# Patient Record
Sex: Male | Born: 1985 | Race: Black or African American | Hispanic: No | Marital: Single | Smoking: Never smoker
Health system: Southern US, Community
[De-identification: ages and names within clinical notes are randomized; demographics above are authoritative.]

## PROBLEM LIST (undated history)

## (undated) DIAGNOSIS — J849 Interstitial pulmonary disease, unspecified: Secondary | ICD-10-CM

## (undated) DIAGNOSIS — F209 Schizophrenia, unspecified: Secondary | ICD-10-CM

## (undated) DIAGNOSIS — F319 Bipolar disorder, unspecified: Secondary | ICD-10-CM

## (undated) DIAGNOSIS — E119 Type 2 diabetes mellitus without complications: Secondary | ICD-10-CM

## (undated) DIAGNOSIS — I509 Heart failure, unspecified: Secondary | ICD-10-CM

## (undated) DIAGNOSIS — N189 Chronic kidney disease, unspecified: Secondary | ICD-10-CM

## (undated) DIAGNOSIS — F909 Attention-deficit hyperactivity disorder, unspecified type: Secondary | ICD-10-CM

## (undated) HISTORY — DX: Schizophrenia, unspecified: F20.9

## (undated) HISTORY — DX: Attention-deficit hyperactivity disorder, unspecified type: F90.9

## (undated) HISTORY — DX: Type 2 diabetes mellitus without complications: E11.9

## (undated) HISTORY — DX: Chronic kidney disease, unspecified: N18.9

## (undated) HISTORY — PX: DENTAL SURGERY: SHX609

## (undated) HISTORY — DX: Bipolar disorder, unspecified: F31.9

---
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
## 2006-01-29 ENCOUNTER — Emergency Department: Payer: Self-pay | Admitting: Emergency Medicine

## 2008-03-21 ENCOUNTER — Emergency Department: Payer: Self-pay | Admitting: Emergency Medicine

## 2010-07-17 ENCOUNTER — Inpatient Hospital Stay: Payer: Self-pay | Admitting: Internal Medicine

## 2011-08-21 LAB — DRUG SCREEN, URINE
Amphetamines, Ur Screen: NEGATIVE (ref ?–1000)
Barbiturates, Ur Screen: NEGATIVE (ref ?–200)
Benzodiazepine, Ur Scrn: NEGATIVE (ref ?–200)
Cannabinoid 50 Ng, Ur ~~LOC~~: NEGATIVE (ref ?–50)
Cocaine Metabolite,Ur ~~LOC~~: NEGATIVE (ref ?–300)
Methadone, Ur Screen: NEGATIVE (ref ?–300)
Tricyclic, Ur Screen: NEGATIVE (ref ?–1000)

## 2011-08-21 LAB — URINALYSIS, COMPLETE
Bilirubin,UR: NEGATIVE
Glucose,UR: NEGATIVE mg/dL (ref 0–75)
Ketone: NEGATIVE
Leukocyte Esterase: NEGATIVE
Protein: 30
Specific Gravity: 1.004 (ref 1.003–1.030)
Squamous Epithelial: NONE SEEN
WBC UR: <1 /HPF (ref 0–5)

## 2011-08-21 LAB — COMPREHENSIVE METABOLIC PANEL
Albumin: 3.1 g/dL — ABNORMAL LOW (ref 3.4–5.0)
Anion Gap: 9 (ref 7–16)
Bilirubin,Total: 0.2 mg/dL (ref 0.2–1.0)
Calcium, Total: 8.9 mg/dL (ref 8.5–10.1)
Chloride: 108 mmol/L — ABNORMAL HIGH (ref 98–107)
Co2: 25 mmol/L (ref 21–32)
Creatinine: 2.05 mg/dL — ABNORMAL HIGH (ref 0.60–1.30)
EGFR (African American): 51 — ABNORMAL LOW
Glucose: 183 mg/dL — ABNORMAL HIGH (ref 65–99)
SGOT(AST): 20 U/L (ref 15–37)
SGPT (ALT): 21 U/L

## 2011-08-21 LAB — CBC
MCH: 23.6 pg — ABNORMAL LOW (ref 26.0–34.0)
MCHC: 30.9 g/dL — ABNORMAL LOW (ref 32.0–36.0)
MCV: 77 fL — ABNORMAL LOW (ref 80–100)
Platelet: 162 10*3/uL (ref 150–440)
RBC: 5.29 10*6/uL (ref 4.40–5.90)
RDW: 15.8 % — ABNORMAL HIGH (ref 11.5–14.5)
WBC: 5.3 10*3/uL (ref 3.8–10.6)

## 2011-08-21 LAB — VALPROIC ACID LEVEL: Valproic Acid: 122 ug/mL — ABNORMAL HIGH

## 2011-08-21 LAB — TSH: Thyroid Stimulating Horm: 3.21 u[IU]/mL

## 2011-08-22 ENCOUNTER — Inpatient Hospital Stay: Payer: Self-pay | Admitting: Psychiatry

## 2014-05-09 ENCOUNTER — Emergency Department: Payer: Self-pay | Admitting: Emergency Medicine

## 2014-07-05 NOTE — Consult Note (Signed)
Brief Consult Note: Diagnosis: schizophrenia.   Patient was seen by consultant.   Recommend further assessment or treatment.   Orders entered.   Comments: Psychiatry: Patient seen. Known to me from outpt. Patient has history of violence and threatening behavior at home. Has been aggressive and threatening to mother and brother. Having intrusive thoughts to hurt someone. Has been on several meds. Seroquel may have been most effective. Will restart at 400mg  at night and 100mg  in am. Will follow tomorrow. [facility].  Electronic Signatures: Audery Amellapacs, Calvary Difranco T (MD)  (Signed 10-Jun-13 18:33)  Authored: Brief Consult Note   Last Updated: 10-Jun-13 18:33 by Audery Amellapacs, Masaji Billups T (MD)

## 2014-07-05 NOTE — Discharge Summary (Signed)
PATIENT NAME:  Walter Wong, Amun A MR#:  [id number] DATE OF BIRTH:  [birth year]  DATE OF ADMISSION:  08/22/2011 DATE OF DISCHARGE:  08/25/2011  HOSPITAL COURSE: See dictated History and Physical for details of admission. This 29 year old man who is seen by me in the outpatient clinic was admitted voluntarily because of worsening agitation at home which had become threatening and unacceptable to his family. He has a history of schizoaffective disorder versus schizophrenia as well as probably developmental disorder. We had recently been making some medication changes. In addition, I think that probably he and his mother have just been spending too much time together and he had been getting on her nerves. In the hospital, he did not display any dangerous behavior. At times he was a little bit loud always redirectable and not threatening. He slept okay. He participated in groups appropriately. Medications were altered in that I started him back on Seroquel, a total of 500 mg a day divided, as well as Tegretol 200 mg t.i.d. The patient tolerated this well. No side effects. I was in communication with his mother while he was in the hospital and confirmed with her that he could go ahead and come back home to stay. At the time of discharge, the patient totally denied any homicidal ideation. He did not show any grossly bizarre behavior, was denying hallucinations, was agreeable to treatment and showed improved insight.   MENTAL STATUS EXAM AT DISCHARGE: Somewhat disheveled man who looks his stated age, not very good hygiene-which is chronic. Eye contact is good. Psychomotor activity is normal. Speech is normal in rate, tone, and volume. Affect is somewhat constricted, a little bit excitable. Mood is stated as good. Thoughts are a little bit scattered but not grossly bizarre and easy to redirect. No obvious delusions. Denies auditory or visual hallucinations. Denies suicidal or homicidal ideation. Shows improved insight and  judgment.   LABORATORY DATA:  Valproic acid level done on the 10th was 122, which he was tolerating well.  Drug screen on admission was negative.  Thyroid stimulating hormone in the normal range.  Alcohol level undetectable.  Chemistry panel showed total protein to be elevated at 8.3, albumin low at 3.1. He has a creatinine of 2.05 which apparently is chronic for him. Glucose nonfasting was 183.  CBC showed hemoglobin slightly low at 12.5.    DISCHARGE MEDICATIONS:  1. Tegretol 200 mg t.i.d. 2. Seroquel 400 mg at night, 100 mg in the morning. 3. Amlodipine 10 mg per day.   DISPOSITION: Discharged back home with his mother. I will continue to follow him up for outpatient treatment.   DIAGNOSIS, PRINCIPAL AND PRIMARY:  AXIS I: Schizoaffective disorder.   SECONDARY DIAGNOSES:  AXIS I: No further.   AXIS II: Developmental disorder, not otherwise specified.   AXIS III:  1. Hypertension.  2. Chronic mild renal insufficiency.   AXIS IV: Moderate-to-severe: Chronic stress from burden of illness and stress from his family at home.   AXIS V: Functioning at the time of discharge 50.   ____________________________ Audery AmelJohn T. Brenda Samano, MD jtc:cbb D: 08/25/2011 17:02:28 ET T: 08/27/2011 17:18:25 ET JOB#: [id number]  cc: Audery AmelJohn T. Kadeisha Betsch, MD, <Dictator> Audery AmelJOHN T Ulrick Methot MD ELECTRONICALLY SIGNED 08/29/2011 15:39

## 2014-07-05 NOTE — H&P (Signed)
PATIENT NAME:  Walter Wong, Walter Wong MR#:  [id number] DATE OF BIRTH:  11-Jul-1985  DATE OF ADMISSION:  08/22/2011  IDENTIFYING INFORMATION AND CHIEF COMPLAINT: 29 year old man who is usually seen by me in the outpatient clinic. I referred him to the Emergency Room for admission yesterday because of worsening behavior problems and threats of violence at home.   CHIEF COMPLAINT: "I get these urges."   HISTORY OF PRESENT ILLNESS: Patient reports that he frequently gets urges to hit somebody. He cannot describe them very well. He denies that he is having auditory hallucinations. He admits that they do not make much sense. They do not necessarily go along with the general feeling of anger. His mood seems to mostly be anxious. He does not necessarily feel that his stress has increased at home. We have been trying to change his medication to find something that is going to work well for him to help with his impulsivity, agitation and paranoid behavior and violence at home. Patient says that he has been taking his medications recently which were Depakote and Navane. He denies that he has been using alcohol or drugs. Information obtained from the mother is that the patient has been terrorizing her at home, been increasingly threatening and violent to the point that she fears constantly for her safety.   PAST PSYCHIATRIC HISTORY: Patient appears to have Wong developmental disorder and has had behavior problems life long. He has had one prior psychiatric hospitalization that we know of that was many years ago, he estimates at least 10. He has been tried on various medications over the years including several mood stabilizers and antipsychotics. It appeared that Seroquel had been working reasonably well but not completely. We had recently been trying to make some adjustments to his medications to find something that might work better. We had tried Zyprexa and Depakote, Wong combination did not seem to be an improvement over the  Seroquel. When he was much younger he was treated with stimulants. Even the mother cannot remember if it made Wong great deal of difference or not. Patient does have Wong history of being aggressive and violent with others, especially his brother with whom he lives. He has described to me in the past that he has genuinely paranoid thoughts, feeling like his brother is trying to hurt him or do something mean to him even when there is no evidence of that. Diagnosis has been unclear. In the past he has been treated at least at one point with lithium which evidently caused kidney damage. This is what they report. Whether it was the lithium or not he does have some insufficiency in his kidney function.   PAST MEDICAL HISTORY: Patient has hypertension which may be related to his kidney damage. He has chronic renal insufficiency to some degree. I do not have past laboratory tests except what we got in the Emergency Room to compare to He is usually maintained on amlodipine.   SOCIAL HISTORY: Patient lives with his mother and older brother. Mother is disabled. Patient does not work and has never worked. Spends all of his time at home. Often gets into fights with other members of the family.   FAMILY HISTORY: Full family history of mental health problems, not known.   SUBSTANCE ABUSE HISTORY: Denies that he drinks alcohol or uses any drugs and denies that he has ever had Wong problem with that in the past.   CURRENT MEDICATIONS: At the time of admission he was taking Depakote 1000 mg at  night, 500 mg in the morning and taking Navane 2 mg in the morning, 4 mg at night. Also the amlodipine 10 mg Wong day.   ALLERGIES: No known drug allergies.   REVIEW OF SYSTEMS: Patient complains of having violent urges to hit people. He does not report any specific changes in his mood. Says that he does not have any pain. Denies any GI complaints. No neurologic complaints. Sleep is fairly stable.   MENTAL STATUS EXAM: Wong somewhat  disheveled young man who looks his stated age. Cooperative with the interview. Eye contact is normal. Psychomotor activity is normal. Speech is normal in tone, rate and amount. Affect is somewhat flattish. Mood is stated as being okay. Thoughts are simplistic, concrete. He does not make any grossly delusional statements. Denies that he is currently feeling paranoid. Does say that at times he has urges to hit people. He denies actually wanting to hurt anybody consciously and denies any suicidal ideation. Insight and judgment impaired.   PHYSICAL EXAMINATION:  GENERAL: Patient is rather large weighing 263 pounds.   VITAL SIGNS: Most recent vital signs show Wong temperature of 98, pulse 121, respirations 20, blood pressure 129/85.   SKIN: Skin is dry over much of his body. Has Wong diffuse rash his face. Does not appear that his hygiene is very good.   HEENT: Pupils are equal and reactive. Face symmetric. Oral mucosa and dentition normal.   NECK: Nontender.   BACK: Nontender.   MUSCULOSKELETAL: Has full range of motion at all extremities and normal gait. Strength and reflexes normal and symmetric throughout.   NEUROLOGICAL: Cranial nerves intact and symmetric.   LUNGS: Clear to auscultation with no wheezes.   HEART: Tachycardic but normal sounds.   ABDOMEN: Large, obese, nontender, normal bowel sounds.   ASSESSMENT: 29 year old man with what sounds like probably Wong developmental disorder but also with probably schizophrenia. Bipolar disorder is possible, although it does not appear from what I can tell that he has clear-cut discrete mood episodes so much as he has this chronic paranoia and disorganized behavior. It is possible that an autistic spectrum disorder and developmental disability may account for all of it. He may have attention deficit hyperactivity disorder. It is somewhat hard to tell in the context of his very limited lifestyle and other symptoms. Patient has been terrorizing his mother  at home. He has been having these urges to hit people and has been more threatening verbally to his mother and brother. Hospitalization required because of threats to others and potential for violence and instability with failure to treat outside the hospital.   TREATMENT PLAN: Restart Seroquel and Tegretol. Admit to the hospital. Check labs. Engage in groups and activities. Social work will communicate with the family and we will work on seeing if we need to make any kind of change in his living situation. He will be engaged in usual ward routines.   DIAGNOSIS PRINCIPLE AND PRIMARY:  AXIS I: Schizophrenia, undifferentiated.   SECONDARY DIAGNOSES:  AXIS I: No further.   AXIS II: Developmental disability, not otherwise specified.   AXIS III:  1. Hypertension.  2. Chronic renal insufficiency.   AXIS IV: Moderate to severe from chronically impaired social relations and isolation.   AXIS V: Functioning at time of evaluation 30.   ____________________________ Audery Amel, MD jtc:cms D: 08/22/2011 17:10:26 ET T: 08/23/2011 07:00:14 ET JOB#: [id number]  cc: Audery Amel, MD, <Dictator> Audery Amel MD ELECTRONICALLY SIGNED 08/24/2011 17:37

## 2014-11-12 ENCOUNTER — Telehealth: Payer: Self-pay

## 2014-11-12 ENCOUNTER — Ambulatory Visit (INDEPENDENT_AMBULATORY_CARE_PROVIDER_SITE_OTHER): Payer: Self-pay | Admitting: Psychiatry

## 2014-11-12 ENCOUNTER — Encounter: Payer: Self-pay | Admitting: Psychiatry

## 2014-11-12 VITALS — BP 122/86 | HR 94 | Temp 97.8°F | Ht 66.0 in | Wt 217.0 lb

## 2014-11-12 DIAGNOSIS — F3162 Bipolar disorder, current episode mixed, moderate: Secondary | ICD-10-CM

## 2014-11-12 DIAGNOSIS — F250 Schizoaffective disorder, bipolar type: Secondary | ICD-10-CM

## 2014-11-12 MED ORDER — CARBAMAZEPINE 200 MG PO TABS: 200.0000 mg | ORAL_TABLET | Freq: Two times a day (BID) | ORAL | Status: DC

## 2014-11-12 MED ORDER — QUETIAPINE FUMARATE 100 MG PO TABS: 100.0000 mg | ORAL_TABLET | Freq: Every day | ORAL | Status: DC

## 2014-11-12 MED ORDER — THIOTHIXENE 5 MG PO CAPS: 5.0000 mg | ORAL_CAPSULE | Freq: Two times a day (BID) | ORAL | Status: DC

## 2014-11-12 NOTE — Progress Notes (Signed)
[facility] MD Progress Note  11/12/2014 3:27 PM Walter Wong  MRN:  [id number] Subjective:  Patient seen for follow-up of his bipolar disorder. He tells me that he is doing very well. [facility] has [facility] into the [facility]. Managing his medicine well. Going through vocational rehabilitation. He is hoping that he will be able to get a job soon. All of this is very positive. He does have anxiety about the possibility of getting a job because he worries he will not be up to it. Principal Problem: @ Diagnosis:   Patient Active Problem List   Diagnosis Date Noted  . Bipolar 1 disorder, mixed, moderate [F31.62] 11/12/2014   Total Time spent with patient: 30 minutes   Past Medical History:  Past Medical History  Diagnosis Date  . ADHD (attention deficit hyperactivity disorder)   . Diabetes mellitus, type II   . Bipolar disorder   . Schizophrenia   . Chronic kidney disease     Past Surgical History  Procedure Laterality Date  . Dental surgery     Family History:  Family History  Problem Relation Age of Onset  . Kidney failure Mother    Social History:  History  Alcohol Use No     History  Drug Use No    Social History   Social History  . Marital Status: Single    Spouse Name: N/A  . Number of Children: N/A  . Years of Education: N/A   Social History Main Topics  . Smoking status: Never Smoker   . Smokeless tobacco: Never Used  . Alcohol Use: No  . Drug Use: No  . Sexual Activity: No   Other Topics Concern  . None   Social History Narrative  . None   Additional History:    Sleep: Good  Appetite:  Good   Assessment:   Musculoskeletal: Strength & Muscle Tone: within normal limits Gait & Station: normal Patient leans: N/A   Psychiatric Specialty Exam: Physical Exam  ROS  Blood pressure 122/86, pulse 94, temperature 97.8 F (36.6 C), temperature source Tympanic, height  (1.676 m), weight 217 lb (98.431 kg), SpO2 97 %.Body mass index is 35.04  kg/(m^2).  General Appearance: Casual  Eye Contact::  Good  Speech:  Clear and Coherent  Volume:  Increased  Mood:  Anxious  Affect:  Full Range  Thought Process:  Goal Directed  Orientation:  Full (Time, Place, and Person)  Thought Content:  Negative  Suicidal Thoughts:  No  Homicidal Thoughts:  No  Memory:  Immediate;   Good Recent;   Fair Remote;   Fair  Judgement:  Fair  Insight:  Fair  Psychomotor Activity:  Normal  Concentration:  Fair  Recall:  [employer] of Knowledge:Fair  Language: Fair  Akathisia:  No  Handed:  Right  AIMS (if indicated):     Assets:  Communication Skills Desire for Improvement Housing Physical Health Social Support  ADL's:  Intact  Cognition: WNL  Sleep:        Current Medications: Current Outpatient Prescriptions  Medication Sig Dispense Refill  . carbamazepine (TEGRETOL) 200 MG tablet Take 1 tablet (200 mg total) by mouth 2 (two) times daily. 60 tablet 5  . QUEtiapine (SEROQUEL) 100 MG tablet Take 1 tablet (100 mg total) by mouth daily. 30 tablet 5  . thiothixene (NAVANE) 5 MG capsule Take 1 capsule (5 mg total) by mouth 2 (two) times daily. 60 capsule 5   No current facility-administered medications for  this visit.    Lab Results: No results found for this or any previous visit (from the past 48 hour(s)).  Physical Findings: AIMS:  , ,  ,  ,    CIWA:    COWS:     Treatment Plan Summary: Medication management and Plan Halvor is wanting to decrease his dose of Tegretol. He thinks the daytime dose is making him more jittery. He is not appear to be manic really and he is happy to continue his other medicine. He is functioning better than I have seen him ever do. We discussed the possible risks and agreed to try decreasing Tegretol to 200 mg twice a day. Continue the 100 mg a day Seroquel and the Navane 5 mg twice a day. Lots of encouragement done. Supportive therapy. Reviewed side effects. I will see him back in 6 months he can call  sooner if needed.   Medical Decision Making:  Established Problem, Stable/Improving (1), Review of Psycho-Social Stressors (1) and Review of Medication Regimen & Side Effects (2)     John Clapacs 11/12/2014, 3:27 PM

## 2014-11-12 NOTE — Telephone Encounter (Signed)
pharmacy called [facility] wanted to confirm rx that was give. I checked dr. Toni Amend note and this is "We discussed the possible risks and agreed to try decreasing Tegretol to 200 mg twice a day."

## 2014-11-23 ENCOUNTER — Other Ambulatory Visit: Payer: Self-pay | Admitting: Psychiatry

## 2014-12-29 ENCOUNTER — Other Ambulatory Visit: Payer: Self-pay

## 2014-12-29 NOTE — Telephone Encounter (Signed)
received request for refill on quetiapine fumarate 300mg  take two tablets by mouth at bedtime (pt last seen on  11-12-14 next appt 05-13-15)

## 2014-12-31 MED ORDER — QUETIAPINE FUMARATE 300 MG PO TABS: ORAL_TABLET | ORAL | Status: DC

## 2015-01-22 ENCOUNTER — Encounter: Payer: Self-pay | Admitting: Emergency Medicine

## 2015-01-22 ENCOUNTER — Emergency Department: Payer: Medicare Other

## 2015-01-22 ENCOUNTER — Inpatient Hospital Stay
Admission: EM | Admit: 2015-01-22 | Discharge: 2015-01-24 | DRG: 291 | Disposition: A | Discharge status: Home / Self Care | Payer: Medicare Other | Attending: Internal Medicine | Admitting: Internal Medicine

## 2015-01-22 ENCOUNTER — Inpatient Hospital Stay (HOSPITAL_COMMUNITY)
Admit: 2015-01-22 | Discharge: 2015-01-22 | Disposition: A | Discharge status: Home / Self Care | Payer: Medicare Other | Attending: Specialist | Admitting: Specialist

## 2015-01-22 DIAGNOSIS — Z79899 Other long term (current) drug therapy: Secondary | ICD-10-CM | POA: Diagnosis not present

## 2015-01-22 DIAGNOSIS — F909 Attention-deficit hyperactivity disorder, unspecified type: Secondary | ICD-10-CM | POA: Diagnosis present

## 2015-01-22 DIAGNOSIS — E1122 Type 2 diabetes mellitus with diabetic chronic kidney disease: Secondary | ICD-10-CM | POA: Diagnosis present

## 2015-01-22 DIAGNOSIS — I509 Heart failure, unspecified: Secondary | ICD-10-CM

## 2015-01-22 DIAGNOSIS — I130 Hypertensive heart and chronic kidney disease with heart failure and stage 1 through stage 4 chronic kidney disease, or unspecified chronic kidney disease: Secondary | ICD-10-CM | POA: Diagnosis present

## 2015-01-22 DIAGNOSIS — Z841 Family history of disorders of kidney and ureter: Secondary | ICD-10-CM

## 2015-01-22 DIAGNOSIS — F209 Schizophrenia, unspecified: Secondary | ICD-10-CM | POA: Diagnosis present

## 2015-01-22 DIAGNOSIS — I5033 Acute on chronic diastolic (congestive) heart failure: Secondary | ICD-10-CM | POA: Diagnosis present

## 2015-01-22 DIAGNOSIS — F319 Bipolar disorder, unspecified: Secondary | ICD-10-CM | POA: Diagnosis present

## 2015-01-22 DIAGNOSIS — R609 Edema, unspecified: Secondary | ICD-10-CM

## 2015-01-22 DIAGNOSIS — N183 Chronic kidney disease, stage 3 (moderate): Secondary | ICD-10-CM | POA: Diagnosis present

## 2015-01-22 DIAGNOSIS — Z9889 Other specified postprocedural states: Secondary | ICD-10-CM | POA: Diagnosis not present

## 2015-01-22 LAB — COMPREHENSIVE METABOLIC PANEL
ALK PHOS: 63 U/L (ref 38–126)
ALT: 11 U/L — AB (ref 17–63)
AST: 20 U/L (ref 15–41)
Albumin: 3.2 g/dL — ABNORMAL LOW (ref 3.5–5.0)
Anion gap: 7 (ref 5–15)
BUN: 16 mg/dL (ref 6–20)
CHLORIDE: 105 mmol/L (ref 101–111)
CO2: 26 mmol/L (ref 22–32)
CREATININE: 1.76 mg/dL — AB (ref 0.61–1.24)
Calcium: 9.3 mg/dL (ref 8.9–10.3)
GFR calc Af Amer: 59 mL/min — ABNORMAL LOW (ref >60)
GFR, EST NON AFRICAN AMERICAN: 51 mL/min — AB (ref >60)
Glucose, Bld: 82 mg/dL (ref 65–99)
Potassium: 4.2 mmol/L (ref 3.5–5.1)
SODIUM: 138 mmol/L (ref 135–145)
Total Bilirubin: 0.2 mg/dL — ABNORMAL LOW (ref 0.3–1.2)
Total Protein: 9.4 g/dL — ABNORMAL HIGH (ref 6.5–8.1)

## 2015-01-22 LAB — CBC
HCT: 43.8 % (ref 40.0–52.0)
HEMOGLOBIN: 13.8 g/dL (ref 13.0–18.0)
MCH: 22.3 pg — ABNORMAL LOW (ref 26.0–34.0)
MCHC: 31.5 g/dL — ABNORMAL LOW (ref 32.0–36.0)
MCV: 70.8 fL — AB (ref 80.0–100.0)
PLATELETS: 360 10*3/uL (ref 150–440)
RBC: 6.18 MIL/uL — AB (ref 4.40–5.90)
RDW: 14.6 % — ABNORMAL HIGH (ref 11.5–14.5)
WBC: 7.6 10*3/uL (ref 3.8–10.6)

## 2015-01-22 LAB — BRAIN NATRIURETIC PEPTIDE: B Natriuretic Peptide: 12 pg/mL (ref 0.0–100.0)

## 2015-01-22 LAB — MRSA PCR SCREENING: MRSA by PCR: NEGATIVE

## 2015-01-22 LAB — TROPONIN I: Troponin I: 0.03 ng/mL (ref <0.031)

## 2015-01-22 MED ORDER — QUETIAPINE FUMARATE 100 MG PO TABS
600.0000 mg | ORAL_TABLET | Freq: Every day | ORAL | Status: DC
  Administered 2015-01-22 – 2015-01-23 (×2): 600 mg via ORAL
  Filled 2015-01-22 (×2): qty 6

## 2015-01-22 MED ORDER — ACETAMINOPHEN 650 MG RE SUPP: 650.0000 mg | Freq: Four times a day (QID) | RECTAL | Status: DC | PRN

## 2015-01-22 MED ORDER — QUETIAPINE FUMARATE 100 MG PO TABS
100.0000 mg | ORAL_TABLET | Freq: Every day | ORAL | Status: DC
  Administered 2015-01-23 – 2015-01-24 (×2): 100 mg via ORAL
  Filled 2015-01-22 (×2): qty 1

## 2015-01-22 MED ORDER — ONDANSETRON HCL 4 MG PO TABS: 4.0000 mg | ORAL_TABLET | Freq: Four times a day (QID) | ORAL | Status: DC | PRN

## 2015-01-22 MED ORDER — HEPARIN SODIUM (PORCINE) 5000 UNIT/ML IJ SOLN
5000.0000 [IU] | Freq: Three times a day (TID) | INTRAMUSCULAR | Status: DC
  Administered 2015-01-22 – 2015-01-24 (×6): 5000 [IU] via SUBCUTANEOUS
  Filled 2015-01-22 (×5): qty 1

## 2015-01-22 MED ORDER — SODIUM CHLORIDE 0.9 % IJ SOLN
3.0000 mL | Freq: Two times a day (BID) | INTRAMUSCULAR | Status: DC
  Administered 2015-01-22 – 2015-01-24 (×4): 3 mL via INTRAVENOUS

## 2015-01-22 MED ORDER — THIOTHIXENE 5 MG PO CAPS
5.0000 mg | ORAL_CAPSULE | Freq: Two times a day (BID) | ORAL | Status: DC
  Administered 2015-01-22 – 2015-01-24 (×4): 5 mg via ORAL
  Filled 2015-01-22 (×6): qty 1

## 2015-01-22 MED ORDER — ACETAMINOPHEN 325 MG PO TABS: 650.0000 mg | ORAL_TABLET | Freq: Four times a day (QID) | ORAL | Status: DC | PRN

## 2015-01-22 MED ORDER — FUROSEMIDE 10 MG/ML IJ SOLN
40.0000 mg | Freq: Two times a day (BID) | INTRAMUSCULAR | Status: DC
  Administered 2015-01-22 – 2015-01-23 (×2): 40 mg via INTRAVENOUS
  Filled 2015-01-22 (×2): qty 4

## 2015-01-22 MED ORDER — CARBAMAZEPINE 200 MG PO TABS
200.0000 mg | ORAL_TABLET | Freq: Two times a day (BID) | ORAL | Status: DC
  Administered 2015-01-22 – 2015-01-24 (×4): 200 mg via ORAL
  Filled 2015-01-22 (×4): qty 1

## 2015-01-22 MED ORDER — ONDANSETRON HCL 4 MG/2ML IJ SOLN: 4.0000 mg | Freq: Four times a day (QID) | INTRAMUSCULAR | Status: DC | PRN

## 2015-01-22 NOTE — ED Provider Notes (Signed)
[facility] Emergency Department Provider Note  ____________________________________________  Time seen: On arrival  I have reviewed the triage vital signs and the nursing notes.   HISTORY  Chief Complaint Cough and Shortness of Breath    HPI Walter Wong is a 29 y.o. male who presents with complaints of shortness of breath. He reports that he has been feeling short of breath for the last month but it has worsened over the last 2 days. He reports he feels he is breathing rapidly. He denies chest pain. He denies lower extremity swelling. He has been told that he has kidney failure but does not know how severe this is reportedly was related to lithium use. He denies fevers chills although he does have a cough. No recent travel. No calf pain.     Past Medical History  Diagnosis Date  . ADHD (attention deficit hyperactivity disorder)   . Diabetes mellitus, type II (HCC)   . Bipolar disorder (HCC)   . Schizophrenia (HCC)   . Chronic kidney disease     Patient Active Problem List   Diagnosis Date Noted  . Bipolar 1 disorder, mixed, moderate (HCC) 11/12/2014    Past Surgical History  Procedure Laterality Date  . Dental surgery      Current Outpatient Rx  Name  Route  Sig  Dispense  Refill  . carbamazepine (TEGRETOL) 200 MG tablet   Oral   Take 1 tablet (200 mg total) by mouth 2 (two) times daily.   60 tablet   5   . QUEtiapine (SEROQUEL) 100 MG tablet   Oral   Take 1 tablet (100 mg total) by mouth daily.   30 tablet   5   . QUEtiapine (SEROQUEL) 300 MG tablet      Take two tablets by mouth at bedtime   60 tablet   5   . thiothixene (NAVANE) 5 MG capsule   Oral   Take 1 capsule (5 mg total) by mouth 2 (two) times daily.   60 capsule   5     Allergies Review of patient's allergies indicates no known allergies.  Family History  Problem Relation Age of Onset  . Kidney failure Mother     Social History Social History   Substance Use Topics  . Smoking status: Never Smoker   . Smokeless tobacco: Never Used  . Alcohol Use: No    Review of Systems  Constitutional: Negative for fever. Eyes: Negative for visual changes. ENT: Negative for sore throat Cardiovascular: Negative for chest pain. Respiratory positive for shortness of breath and cough Gastrointestinal: Negative for abdominal pain, vomiting and diarrhea. Genitourinary: Negative for dysuria. Musculoskeletal: Negative for back pain. Skin: Negative for rash. Neurological: Negative for headaches or focal weakness Psychiatric: No anxiety    ____________________________________________   PHYSICAL EXAM:  VITAL SIGNS: ED Triage Vitals  Enc Vitals Group     BP 01/22/15 1100 136/93 mmHg     Pulse Rate 01/22/15 1100 105     Resp 01/22/15 1100 24     Temp --      Temp src --      SpO2 01/22/15 1100 91 %     Weight 01/22/15 1100 214 lb (97.07 kg)     Height 01/22/15 1100 5\' 6"  (1.676 m)     Head Cir --      Peak Flow --      Pain Score 01/22/15 1101 7     Pain Loc --  Pain Edu? --      Excl. in GC? --      Constitutional: Alert and oriented. Well appearing and in no distress. Eyes: Conjunctivae are normal.  ENT   Head: Normocephalic and atraumatic.   Mouth/Throat: Mucous membranes are moist. Cardiovascular: Normal rate, regular rhythm. Normal and symmetric distal pulses are present in all extremities. No murmurs, rubs, or gallops. Respiratory: Positive tachypnea. Mild bibasilar rales Gastrointestinal: Soft and non-tender in all quadrants. No distention. There is no CVA tenderness. Genitourinary: deferred Musculoskeletal: Nontender with normal range of motion in all extremities. No lower extremity tenderness nor edema. Neurologic:  Normal speech and language. No gross focal neurologic deficits are appreciated. Skin:  Skin is warm, dry and intact. No rash noted. Psychiatric: Mood and affect are normal. Patient exhibits  appropriate insight and judgment.  ____________________________________________    LABS (pertinent positives/negatives)  Labs Reviewed  CBC - Abnormal; Notable for the following:    RBC 6.18 (*)    MCV 70.8 (*)    MCH 22.3 (*)    MCHC 31.5 (*)    RDW 14.6 (*)    All other components within normal limits  COMPREHENSIVE METABOLIC PANEL - Abnormal; Notable for the following:    Creatinine, Ser 1.76 (*)    Total Protein 9.4 (*)    Albumin 3.2 (*)    ALT 11 (*)    Total Bilirubin 0.2 (*)    GFR calc non Af Amer 51 (*)    GFR calc Af Amer 59 (*)    All other components within normal limits  TROPONIN I  BRAIN NATRIURETIC PEPTIDE    ____________________________________________   EKG   ED ECG REPORT I, Jene Every, the attending physician, personally viewed and interpreted this ECG.   Date: 01/22/2015  EKG Time: 11:12 AM  Rate: 107  Rhythm: sinus tachycardia  Axis: Normal  Intervals:none  ST&T Change: Nonspecific   ____________________________________________    RADIOLOGY I have personally reviewed any xrays that were ordered on this patient: Positive interstitial edema chest x-ray  ____________________________________________   PROCEDURES  Procedure(s) performed: none  Critical Care performed: none  ____________________________________________   INITIAL IMPRESSION / ASSESSMENT AND PLAN / ED COURSE  Pertinent labs & imaging results that were available during my care of the patient were reviewed by me and considered in my medical decision making (see chart for details).  Patient presents with shortness of breath which is worse when lying flat. X-ray is consistent with interstitial edema. This is likely related to a new diagnosis of CHF given  kidney function has not worsened.  Given the patient's tachypnea and I suspect he will require admission I will consult the hospitalist  ____________________________________________   FINAL CLINICAL  IMPRESSION(S) / ED DIAGNOSES  Final diagnoses:  Congestive heart failure, unspecified congestive heart failure chronicity, unspecified congestive heart failure type (HCC)  Interstitial edema     Jene Every, MD 01/22/15 1510

## 2015-01-22 NOTE — H&P (Signed)
[facility] - [place] at [facility]   PATIENT NAME: Walter GoldmannJason Wong    MR#:  [id number]  DATE OF BIRTH:  11/14/1985  DATE OF ADMISSION:  01/22/2015  PRIMARY CARE PHYSICIAN: Mickel FuchsWROTH, THOMAS H, MD  REQUESTING/REFERRING PHYSICIAN: Dr. Jene Everyobert Kinner  CHIEF COMPLAINT:   Chief Complaint  Patient presents with  . Cough  . Shortness of Breath    HISTORY OF PRESENT ILLNESS:  Walter GoldmannJason Mcclees  is a 29 y.o. male with a known history of diabetes type 2, bipolar disorder, schizophrenia, chronic kidney disease stage III, ADHD who presents to the hospital due to shortness of breath and cough progressively getting worse over the past few days. Is that he has been short of breath for quite a long time but has progressively gotten worse over the past few days. He admits to cough is productive of clear sputum. He denies any fevers, chills, paroxysmal nocturnal dyspnea, orthopnea, leg swelling or weight gain. Given his progressive nature of his symptoms and was brought to the hospital for further evaluation. Emergency room patient was noted to have chest x-ray findings to suggest a primary vascular congestion and he was noted to have been in mild congestive heart failure which is new onset for the patient. Hospitalist services were contacted further treatment and evaluation.  PAST MEDICAL HISTORY:   Past Medical History  Diagnosis Date  . ADHD (attention deficit hyperactivity disorder)   . Diabetes mellitus, type II (HCC)   . Bipolar disorder (HCC)   . Schizophrenia (HCC)   . Chronic kidney disease     PAST SURGICAL HISTORY:   Past Surgical History  Procedure Laterality Date  . Dental surgery      SOCIAL HISTORY:   Social History  Substance Use Topics  . Smoking status: Never Smoker   . Smokeless tobacco: Never Used  . Alcohol Use: No    FAMILY HISTORY:   Family History  Problem Relation Age of Onset  . Kidney failure Mother     DRUG ALLERGIES:  No Known  Allergies  REVIEW OF SYSTEMS:   Review of Systems  Constitutional: Negative for fever and weight loss.  HENT: Negative for congestion, nosebleeds and tinnitus.   Eyes: Negative for blurred vision, double vision and redness.  Respiratory: Positive for cough, sputum production and shortness of breath. Negative for hemoptysis.   Cardiovascular: Negative for chest pain, orthopnea, leg swelling and PND.  Gastrointestinal: Negative for nausea, vomiting, abdominal pain, diarrhea and melena.  Genitourinary: Negative for dysuria, urgency and hematuria.  Musculoskeletal: Negative for joint pain and falls.  Neurological: Positive for weakness. Negative for dizziness, tingling, sensory change, focal weakness, seizures and headaches.  Endo/Heme/Allergies: Negative for polydipsia. Does not bruise/bleed easily.  Psychiatric/Behavioral: Negative for depression and memory loss. The patient is not nervous/anxious.     MEDICATIONS AT HOME:   Prior to Admission medications   Medication Sig Start Date End Date Taking? Authorizing Provider  carbamazepine (TEGRETOL) 200 MG tablet Take 1 tablet (200 mg total) by mouth 2 (two) times daily. 11/12/14  Yes Audery AmelJohn T Clapacs, MD  QUEtiapine (SEROQUEL) 100 MG tablet Take 1 tablet (100 mg total) by mouth daily. 11/12/14  Yes Audery AmelJohn T Clapacs, MD  QUEtiapine (SEROQUEL) 300 MG tablet Take 600 mg by mouth at bedtime.   Yes Historical Provider, MD  thiothixene (NAVANE) 5 MG capsule Take 1 capsule (5 mg total) by mouth 2 (two) times daily. 11/12/14  Yes Audery AmelJohn T Clapacs, MD      VITAL  SIGNS:  Blood pressure 117/87, pulse 86, resp. rate 40, height  (1.676 m), weight 97.07 kg (214 lb), SpO2 98 %.  PHYSICAL EXAMINATION:  Physical Exam  GENERAL:  29 y.o.-year-old patient lying in the bed in mild respiratory distress.  EYES: Pupils equal, round, reactive to light and accommodation. No scleral icterus. Extraocular muscles intact.  HEENT: Head atraumatic, normocephalic.  Oropharynx and nasopharynx clear. No oropharyngeal erythema, moist oral mucosa  NECK:  Supple, no jugular venous distention. No thyroid enlargement, no tenderness.  LUNGS: Diffuse crackles bilaterally. Positive use of accessory muscles, no dullness to percussion. CARDIOVASCULAR: S1, S2 RRR. No murmurs, rubs, gallops, clicks.  ABDOMEN: Soft, nontender, nondistended. Bowel sounds present. No organomegaly or mass.  EXTREMITIES: No pedal edema, cyanosis, or clubbing. + 2 pedal & radial pulses b/l.   NEUROLOGIC: Cranial nerves II through XII are intact. No focal Motor or sensory deficits appreciated b/l PSYCHIATRIC: The patient is alert and oriented x 3. Good affect.  SKIN: No obvious rash, lesion, or ulcer.   LABORATORY PANEL:   CBC  Recent Labs Lab 01/22/15 1125  WBC 7.6  HGB 13.8  HCT 43.8  PLT 360   ------------------------------------------------------------------------------------------------------------------  Chemistries   Recent Labs Lab 01/22/15 1125  NA 138  K 4.2  CL 105  CO2 26  GLUCOSE 82  BUN 16  CREATININE 1.76*  CALCIUM 9.3  AST 20  ALT 11*  ALKPHOS 63  BILITOT 0.2*   ------------------------------------------------------------------------------------------------------------------  Cardiac Enzymes  Recent Labs Lab 01/22/15 1125  TROPONINI 0.03   ------------------------------------------------------------------------------------------------------------------  RADIOLOGY:  Dg Chest 2 View  01/22/2015  CLINICAL DATA:  Shortness of breath with exertion EXAM: CHEST  2 VIEW COMPARISON:  Jul 17, 2010 FINDINGS: Degree of inspiration is shallow. There is bibasilar interstitial edema. Heart is upper normal in size with mild pulmonary venous hypertension. No adenopathy. No bone lesions. No pneumothorax. IMPRESSION: Findings indicative of a degree of congestive heart failure. No airspace consolidation. Electronically Signed   By: Bretta Bang III M.D.   On:  01/22/2015 11:53     IMPRESSION AND PLAN:   29 year old male with past medical history of diabetes, hypertension, ADHD, history of bipolar disorder, chronic kidney disease stage III who presents to the hospital due to shortness of breath cough and noted to be in congestive heart failure.  #1 congestive heart failure-this is likely presenting diagnosis given his chest x-ray findings and his clinical symptoms. -Unclear of this is diastolic or systolic. I will start him on IV Lasix, follow I's and O's and daily weights. -Check a two-dimensional echocardiogram. - If needed will consult cardiology.  #2 chronic kidney disease stage III-patient's creatinine is close to baseline. I'll follow his BUN and creatinine with IV diuresis.  #3 hypertension-patient was not taking antihypertensive particle into the hospital. -Place him on when necessary hydralazine.  #4 history of schizophrenia-continue Seroquel.  #5 history of bipolar disorder-continue Tegretol.   All the records are reviewed and case discussed with ED provider. Management plans discussed with the patient, family and they are in agreement.  CODE STATUS: Full  TOTAL TIME TAKING CARE OF THIS PATIENT: 50 minutes.    Houston Siren M.D on 01/22/2015 at 4:20 PM  Between 7am to 6pm - Pager - [phone]  After 6pm go to [email or web address] - password [email or web address] [facility]  [place] [facility]  Office  [phone]  CC: Primary care physician; Mickel Fuchs, MD

## 2015-01-22 NOTE — ED Notes (Signed)
Pt placed on 2L via [place].  

## 2015-01-22 NOTE — ED Notes (Signed)
Pt sent here by PCP for cough x1 month, increased shortness of breath. Pt becomes very short of breath with little exertion. Pt denies hx of smoking.

## 2015-01-22 NOTE — Progress Notes (Signed)
*  PRELIMINARY RESULTS* Echocardiogram 2D Echocardiogram has been performed.  Walter Wong 01/22/2015, 7:14 PM

## 2015-01-23 LAB — BASIC METABOLIC PANEL
Anion gap: 9 (ref 5–15)
BUN: 22 mg/dL — ABNORMAL HIGH (ref 6–20)
CO2: 26 mmol/L (ref 22–32)
Calcium: 9.3 mg/dL (ref 8.9–10.3)
Chloride: 102 mmol/L (ref 101–111)
Creatinine, Ser: 1.91 mg/dL — ABNORMAL HIGH (ref 0.61–1.24)
GFR calc Af Amer: 54 mL/min — ABNORMAL LOW (ref >60)
GFR calc non Af Amer: 46 mL/min — ABNORMAL LOW (ref >60)
Glucose, Bld: 100 mg/dL — ABNORMAL HIGH (ref 65–99)
Potassium: 4.4 mmol/L (ref 3.5–5.1)
Sodium: 137 mmol/L (ref 135–145)

## 2015-01-23 LAB — CBC
HEMATOCRIT: 41 % (ref 40.0–52.0)
HEMOGLOBIN: 13 g/dL (ref 13.0–18.0)
MCH: 22.5 pg — ABNORMAL LOW (ref 26.0–34.0)
MCHC: 31.8 g/dL — AB (ref 32.0–36.0)
MCV: 70.8 fL — ABNORMAL LOW (ref 80.0–100.0)
Platelets: 358 10*3/uL (ref 150–440)
RBC: 5.8 MIL/uL (ref 4.40–5.90)
RDW: 14.7 % — ABNORMAL HIGH (ref 11.5–14.5)
WBC: 7.8 10*3/uL (ref 3.8–10.6)

## 2015-01-23 MED ORDER — FUROSEMIDE 20 MG PO TABS
20.0000 mg | ORAL_TABLET | Freq: Two times a day (BID) | ORAL | Status: DC
  Administered 2015-01-23 – 2015-01-24 (×2): 20 mg via ORAL
  Filled 2015-01-23 (×2): qty 1

## 2015-01-23 NOTE — Progress Notes (Signed)
[facility] -  at [facility]   PATIENT NAME: Javyn Havlin    MR#:  [id number]  DATE OF BIRTH:  09/01/1985  SUBJECTIVE: 29 year old male patient admitted for shortness of breath found to have CHF exacerbation. Patient seen today, he says he feels less short of breath and he feels better today.   CHIEF COMPLAINT:   Chief Complaint  Patient presents with  . Cough  . Shortness of Breath    REVIEW OF SYSTEMS:    Review of Systems  Constitutional: Negative for fever and chills.  HENT: Negative for hearing loss and tinnitus.   Eyes: Negative for blurred vision, double vision and photophobia.  Respiratory: Negative for cough, hemoptysis and shortness of breath.   Cardiovascular: Negative for chest pain, palpitations, orthopnea and leg swelling.  Gastrointestinal: Negative for heartburn, vomiting, abdominal pain and diarrhea.  Genitourinary: Negative for dysuria and urgency.  Musculoskeletal: Negative for myalgias and neck pain.  Skin: Negative for rash.  Neurological: Negative for dizziness, focal weakness, seizures, weakness and headaches.  Psychiatric/Behavioral: Negative for memory loss. The patient does not have insomnia.     Nutrition:  Tolerating Diet: Tolerating PT:      DRUG ALLERGIES:  No Known Allergies  VITALS:  Blood pressure 111/60, pulse 94, temperature 98.1 F (36.7 C), temperature source Oral, resp. rate 18, height  (1.676 m), weight 93.32 kg (205 lb 11.7 oz), SpO2 98 %.  PHYSICAL EXAMINATION:   Physical Exam  GENERAL:  29 y.o.-year-old patient lying in the bed with no acute distress.  EYES: Pupils equal, round, reactive to light and accommodation. No scleral icterus. Extraocular muscles intact.  HEENT: Head atraumatic, normocephalic. Oropharynx and nasopharynx clear.  NECK:  Supple, no jugular venous distention. No thyroid enlargement, no tenderness.  LUNGS: Normal breath sounds bilaterally, no wheezing, rales,rhonchi  or crepitation. No use of accessory muscles of respiration.  CARDIOVASCULAR: S1, S2 normal. No murmurs, rubs, or gallops.  ABDOMEN: Soft, nontender, nondistended. Bowel sounds present. No organomegaly or mass.  EXTREMITIES: No pedal edema, cyanosis, or clubbing.  NEUROLOGIC: Cranial nerves II through XII are intact. Muscle strength 5/5 in all extremities. Sensation intact. Gait not checked.  PSYCHIATRIC: The patient is alert and oriented x 3.  SKIN: No obvious rash, lesion, or ulcer.    LABORATORY PANEL:   CBC  Recent Labs Lab 01/23/15 0459  WBC 7.8  HGB 13.0  HCT 41.0  PLT 358   ------------------------------------------------------------------------------------------------------------------  Chemistries   Recent Labs Lab 01/22/15 1125 01/23/15 0459  NA 138 137  K 4.2 4.4  CL 105 102  CO2 26 26  GLUCOSE 82 100*  BUN 16 22*  CREATININE 1.76* 1.91*  CALCIUM 9.3 9.3  AST 20  --   ALT 11*  --   ALKPHOS 63  --   BILITOT 0.2*  --    ------------------------------------------------------------------------------------------------------------------  Cardiac Enzymes  Recent Labs Lab 01/22/15 1125  TROPONINI 0.03   ------------------------------------------------------------------------------------------------------------------  RADIOLOGY:  Dg Chest 2 View  01/22/2015  CLINICAL DATA:  Shortness of breath with exertion EXAM: CHEST  2 VIEW COMPARISON:  Jul 17, 2010 FINDINGS: Degree of inspiration is shallow. There is bibasilar interstitial edema. Heart is upper normal in size with mild pulmonary venous hypertension. No adenopathy. No bone lesions. No pneumothorax. IMPRESSION: Findings indicative of a degree of congestive heart failure. No airspace consolidation. Electronically Signed   By: Bretta Bang III M.D.   On: 01/22/2015 11:53     ASSESSMENT AND PLAN:  Active Problems:   CHF (congestive heart failure) (HCC) Acute on diastolic CHF: Patient EF is more  than 50%. Change IV Lasix to by mouth Lasix.  3.schizophrenia;/bipolar disorder'Stable #4 chronic kidney disease stage III: Renal function is slightly worse today watch closely while he is on IV Lasix.   likely discharge tomorrow  All the records are reviewed and case discussed with Care Management/Social Workerr. Management plans discussed with the patient, family and they are in agreement.  CODE STATUS:full TOTAL TIME TAKING CARE OF THIS PATIENT: 35 minutes.   POSSIBLE D/C IN 1-2DAYS, DEPENDING ON CLINICAL CONDITION.   Katha HammingKONIDENA,Tilford Deaton M.D on 01/23/2015 at 12:56 PM  Between 7am to 6pm - Pager - [phone]  After 6pm go to [email or web address] - password [email or web address] [facility]  [facility]  Office  [phone]  CC: Primary care physician; Mickel FuchsWROTH, THOMAS H, MD

## 2015-01-23 NOTE — Clinical Social Work Note (Signed)
Clinical Social Work Assessment  Patient Details  Name: Walter Wong MRN: [id number] Date of Birth: 25-Mar-1985  Date of referral:  01/23/15               Reason for consult:  Facility Placement                Permission sought to share information with:  Facility Art therapist granted to share information::  Yes, Verbal Permission Granted  Name::        Agency::     Relationship::     Contact Information:     Housing/Transportation Living arrangements for the past 2 months:  Group Home Source of Information:  Patient Patient Interpreter Needed:  None Criminal Activity/Legal Involvement Pertinent to Current Situation/Hospitalization:  No - Comment as needed Significant Relationships:  Parents Lives with:  Facility Resident Do you feel safe going back to the place where you live?  Yes Need for family participation in patient care:  No (Coment)  Care giving concerns:  [place] Work[facility] assessment / plan:  CSW met with patient this afternoon and he was very pleasant and stated that he has lived at [facility] for 2 years. Patient states he likes it there and gets what he needs. He states his mom is his main support system and that she will pick him up tomorrow when discharged and take him back to the group home. Patient states he has no concerns at this time.   CSW contacted the owner of the group home: Walter Wong: [phone] and Walter Wong stated that he can take patient back and is anticipating him returning tomorrow.   Employment status:  Disabled (Comment on whether or not currently receiving Disability) Insurance information:  [place] PT Recommendations:  Not assessed at this time Information / Referral to community resources:     Patient/Family's Response to care:  Patient happy about getting to leave hospital soon.  Patient/Family's Understanding of and Emotional Response to Diagnosis,  Current Treatment, and Prognosis:  Patient expressed understanding that he may discharge tomorrow and already had arranged for his mom to transport him when time.  Emotional Assessment Appearance:  Appears stated age Attitude/Demeanor/Rapport:   (pleasant and cooperative) Affect (typically observed):  Accepting, Appropriate, Calm, Happy Orientation:  Oriented to Self, Oriented to Place, Oriented to  Time, Oriented to Situation Alcohol / Substance use:  Not Applicable Psych involvement (Current and /or in the community):  Outpatient Provider  Discharge Needs  Concerns to be addressed:  Care Coordination Readmission within the last 30 days:  No Current discharge risk:  None Barriers to Discharge:  No Barriers Identified   Walter Leff, LCSW 01/23/2015, 3:12 PM

## 2015-01-24 MED ORDER — FUROSEMIDE 20 MG PO TABS: 20.0000 mg | ORAL_TABLET | Freq: Two times a day (BID) | ORAL | Status: DC

## 2015-01-24 NOTE — Discharge Summary (Signed)
Walter Wong, is a 29 y.o. male  DOB 03/09/1986  MRN [id number].  Admission date:  01/22/2015  Admitting Physician  Houston SirenVivek J Sainani, MD  Discharge Date:  01/24/2015   Primary MD  Mickel FuchsWROTH, THOMAS H, MD  Recommendations for primary care physician for things to follow:  Follow-up with primary doctor in 1 week   Admission Diagnosis  Interstitial edema [R60.9] Congestive heart failure, unspecified congestive heart failure chronicity, unspecified congestive heart failure type (HCC) [I50.9]   Discharge Diagnosis  Interstitial edema [R60.9] Congestive heart failure, unspecified congestive heart failure chronicity, unspecified congestive heart failure type (HCC) [I50.9]    Active Problems:   CHF (congestive heart failure) (HCC)      Past Medical History  Diagnosis Date  . ADHD (attention deficit hyperactivity disorder)   . Diabetes mellitus, type II (HCC)   . Bipolar disorder (HCC)   . Schizophrenia (HCC)   . Chronic kidney disease     Past Surgical History  Procedure Laterality Date  . Dental surgery         History of present illness and  Hospital Course:     Kindly see H&P for history of present illness and admission details, please review complete Labs, Consult reports and Test reports for all details in brief  HPI  from the history and physical done on the day of admission 29 year old male patient with history ofADHD, diabetes mellitus type 2, chronic kidney disease stage III admitted for shortness of breath, cough. Patient noted to have congestive heart failure . Admitted for the same.  Hospital Course   1 acute on chronic diastolic heart failure:patient's started on IV Lasix. Echocardiogram showed EF of more  than 60%.patient is feeling better today no shortness of breath. Patient to be discharged back to  group home with by mouth Lasix.needs close monitoring of kidney function. Patient says that he follows up with Dr. Thedore MinsSingh there and advised that he follows up with him in about 1 week so that his kidney labs will be checked. 2.history of bipolar disorder;continue Tegretol, Seroquel, novane #3 chronic kidney disease stage III:H and creatinine is around 1.76 at baseline.   Discharge Condition: stable   Follow UP      Discharge Instructions  and  Discharge Medications        Medication List    TAKE these medications        carbamazepine 200 MG tablet  Commonly known as:  TEGRETOL  Take 1 tablet (200 mg total) by mouth 2 (two) times daily.     furosemide 20 MG tablet  Commonly known as:  LASIX  Take 1 tablet (20 mg total) by mouth 2 (two) times daily.     QUEtiapine 300 MG tablet  Commonly known as:  SEROQUEL  Take 600 mg by mouth at bedtime.     QUEtiapine 100 MG tablet  Commonly known as:  SEROQUEL  Take 1 tablet (100 mg total) by mouth daily.     thiothixene 5 MG capsule  Commonly known as:  NAVANE  Take 1 capsule (5 mg total) by mouth 2 (two) times daily.          Diet and Activity recommendation: See Discharge Instructions above   Consults obtained -none   Major procedures and Radiology Reports - PLEASE review detailed and final reports for all details, in brief -     Dg Chest 2 View  01/22/2015  CLINICAL DATA:  Shortness of breath with exertion EXAM: CHEST  2  VIEW COMPARISON:  Jul 17, 2010 FINDINGS: Degree of inspiration is shallow. There is bibasilar interstitial edema. Heart is upper normal in size with mild pulmonary venous hypertension. No adenopathy. No bone lesions. No pneumothorax. IMPRESSION: Findings indicative of a degree of congestive heart failure. No airspace consolidation. Electronically Signed   By: Bretta Bang III M.D.   On: 01/22/2015 11:53    Micro Results    Recent Results (from the past 240 hour(s))  MRSA PCR Screening      Status: None   Collection Time: 01/22/15  9:50 PM  Result Value Ref Range Status   MRSA by PCR NEGATIVE NEGATIVE Final    Comment:        The GeneXpert MRSA Assay (FDA approved for NASAL specimens only), is one component of a comprehensive MRSA colonization surveillance program. It is not intended to diagnose MRSA infection nor to guide or monitor treatment for MRSA infections.        Today   Subjective:   Walter Wong today has no headache,no chest abdominal pain,no new weakness tingling or numbness,no sob. feels much better wants to go home today  Objective:   Blood pressure 112/79, pulse 92, temperature 98.5 F (36.9 C), temperature source Oral, resp. rate 19, height  (1.676 m), weight 93.32 kg (205 lb 11.7 oz), SpO2 95 %.   Intake/Output Summary (Last 24 hours) at 01/24/15 1107 Last data filed at 01/24/15 1050  Gross per 24 hour  Intake    960 ml  Output   3550 ml  Net  -2590 ml    Exam Awake Alert, Oriented x 3, No new F.N deficits, Normal affect .AT,PERRAL Supple Neck,No JVD, No cervical lymphadenopathy appriciated.  Symmetrical Chest wall movement, Good air movement bilaterally, CTAB RRR,No Gallops,Rubs or new Murmurs, No Parasternal Heave +ve B.Sounds, Abd Soft, Non tender, No organomegaly appriciated, No rebound -guarding or rigidity. No Cyanosis, Clubbing or edema, No new Rash or bruise  Data Review   CBC w Diff: Lab Results  Component Value Date   WBC 7.8 01/23/2015   WBC 5.3 08/21/2011   HGB 13.0 01/23/2015   HGB 12.5* 08/21/2011   HCT 41.0 01/23/2015   HCT 40.4 08/21/2011   PLT 358 01/23/2015   PLT 162 08/21/2011    CMP: Lab Results  Component Value Date   NA 137 01/23/2015   NA 142 08/21/2011   K 4.4 01/23/2015   K 3.9 08/21/2011   CL 102 01/23/2015   CL 108* 08/21/2011   CO2 26 01/23/2015   CO2 25 08/21/2011   BUN 22* 01/23/2015   BUN 15 08/21/2011   CREATININE 1.91* 01/23/2015   CREATININE 2.05* 08/21/2011   PROT 9.4*  01/22/2015   PROT 8.3* 08/21/2011   ALBUMIN 3.2* 01/22/2015   ALBUMIN 3.1* 08/21/2011   BILITOT 0.2* 01/22/2015   BILITOT 0.2 08/21/2011   ALKPHOS 63 01/22/2015   ALKPHOS 81 08/21/2011   AST 20 01/22/2015   AST 20 08/21/2011   ALT 11* 01/22/2015   ALT 21 08/21/2011  .   Total Time in preparing paper work, data evaluation and todays exam - 35 minutes  Walter Wong M.D on 01/24/2015 at 11:07 AM    Note: This dictation was prepared with Dragon dictation along with smaller phrase technology. Any transcriptional errors that result from this process are unintentional.

## 2015-01-24 NOTE — Discharge Instructions (Signed)
Furosemide tablets °What is this medicine? °FUROSEMIDE (fyoor [place] se mide) is a diuretic. It helps you make more urine and to lose salt and excess water from your body. This medicine is used to treat high blood pressure, and edema or swelling from heart, kidney, or liver disease. °This medicine may be used for other purposes; ask your health care provider or pharmacist if you have questions. °What should I tell my health care provider before I take this medicine? °They need to know if you have any of these conditions: °-abnormal blood electrolytes °-diarrhea or vomiting °-gout °-heart disease °-kidney disease, small amounts of urine, or difficulty passing urine °-liver disease °-thyroid disease °-an unusual or allergic reaction to furosemide, sulfa drugs, other medicines, foods, dyes, or preservatives °-pregnant or trying to get pregnant °-breast-feeding °How should I use this medicine? °Take this medicine by mouth with a glass of water. Follow the directions on the prescription label. You may take this medicine with or without food. If it upsets your stomach, take it with food or milk. Do not take your medicine more often than directed. Remember that you will need to pass more urine after taking this medicine. Do not take your medicine at a time of day that will cause you problems. Do not take at bedtime. °Talk to your pediatrician regarding the use of this medicine in children. While this drug may be prescribed for selected conditions, precautions do apply. °Overdosage: If you think you have taken too much of this medicine contact a poison control center or emergency room at once. °NOTE: This medicine is only for you. Do not share this medicine with others. °What if I miss a dose? °If you miss a dose, take it as soon as you can. If it is almost time for your next dose, take only that dose. Do not take double or extra doses. °What may interact with this medicine? °-aspirin and aspirin-like medicines °-certain  antibiotics °-chloral hydrate °-cisplatin °-cyclosporine °-digoxin °-diuretics °-laxatives °-lithium °-medicines for blood pressure °-medicines that relax muscles for surgery °-methotrexate °-NSAIDs, medicines for pain and inflammation like ibuprofen, naproxen, or indomethacin °-phenytoin °-steroid medicines like prednisone or cortisone °-sucralfate °-thyroid hormones °This list may not describe all possible interactions. Give your health care provider a list of all the medicines, herbs, non-prescription drugs, or dietary supplements you use. Also tell them if you smoke, drink alcohol, or use illegal drugs. Some items may interact with your medicine. °What should I watch for while using this medicine? °Visit your doctor or health care professional for regular checks on your progress. Check your blood pressure regularly. Ask your doctor or health care professional what your blood pressure should be, and when you should contact him or her. If you are a diabetic, check your blood sugar as directed. °You may need to be on a special diet while taking this medicine. Check with your doctor. Also, ask how many glasses of fluid you need to drink a day. You must not get dehydrated. °You may get drowsy or dizzy. Do not drive, use machinery, or do anything that needs mental alertness until you know how this drug affects you. Do not stand or sit up quickly, especially if you are an older patient. This reduces the risk of dizzy or fainting spells. Alcohol can make you more drowsy and dizzy. Avoid alcoholic drinks. °This medicine can make you more sensitive to the sun. Keep out of the sun. If you cannot avoid being in the sun, wear protective clothing and   use sunscreen. Do not use sun lamps or tanning beds/booths. What side effects may I notice from receiving this medicine? Side effects that you should report to your doctor or health care professional as soon as possible: -blood in urine or stools -dry mouth -fever or  chills -hearing loss or ringing in the ears -irregular heartbeat -muscle pain or weakness, cramps -skin rash -stomach upset, pain, or nausea -tingling or numbness in the hands or feet -unusually weak or tired -vomiting or diarrhea -yellowing of the eyes or skin Side effects that usually do not require medical attention (report to your doctor or health care professional if they continue or are bothersome): -headache -loss of appetite -unusual bleeding or bruising This list may not describe all possible side effects. Call your doctor for medical advice about side effects. You may report side effects to FDA at 1-800-FDA-1088. Where should I keep my medicine? Keep out of the reach of children. Store at room temperature between 15 and 30 degrees C (59 and 86 degrees F). Protect from light. Throw away any unused medicine after the expiration date. NOTE: This sheet is a summary. It may not cover all possible information. If you have questions about this medicine, talk to your doctor, pharmacist, or health care provider.    2016, Elsevier/Gold Standard. (2014-05-20 13:49:50)  Heart Failure Heart failure means your heart has trouble pumping blood. This makes it hard for your body to work well. Heart failure is usually a long-term (chronic) condition. You must take good care of yourself and follow your doctor's treatment plan. HOME CARE  Take your heart medicine as told by your doctor.  Do not stop taking medicine unless your doctor tells you to.  Do not skip any dose of medicine.  Refill your medicines before they run out.  Take other medicines only as told by your doctor or pharmacist.  Stay active if told by your doctor. The elderly and people with severe heart failure should talk with a doctor about physical activity.  Eat heart-healthy foods. Choose foods that are without trans fat and are low in saturated fat, cholesterol, and salt (sodium). This includes fresh or frozen fruits and  vegetables, fish, lean meats, fat-free or low-fat dairy foods, whole grains, and high-fiber foods. Lentils and dried peas and beans (legumes) are also good choices.  Limit salt if told by your doctor.  Cook in a healthy way. Roast, grill, broil, bake, poach, steam, or stir-fry foods.  Limit fluids as told by your doctor.  Weigh yourself every morning. Do this after you pee (urinate) and before you eat breakfast. Write down your weight to give to your doctor.  Take your blood pressure and write it down if your doctor tells you to.  Ask your doctor how to check your pulse. Check your pulse as told.  Lose weight if told by your doctor.  Stop smoking or chewing tobacco. Do not use gum or patches that help you quit without your doctor's approval.  Schedule and go to doctor visits as told.  Nonpregnant women should have no more than 1 drink a day. Men should have no more than 2 drinks a day. Talk to your doctor about drinking alcohol.  Stop illegal drug use.  Stay current with shots (immunizations).  Manage your health conditions as told by your doctor.  Learn to manage your stress.  Rest when you are tired.  If it is really hot outside:  Avoid intense activities.  Use air conditioning or fans, or  get in a cooler place.  Avoid caffeine and alcohol.  Wear loose-fitting, lightweight, and light-colored clothing.  If it is really cold outside:  Avoid intense activities.  Layer your clothing.  Wear mittens or gloves, a hat, and a scarf when going outside.  Avoid alcohol.  Learn about heart failure and get support as needed.  Get help to maintain or improve your quality of life and your ability to care for yourself as needed. GET HELP IF:   You gain weight quickly.  You are more short of breath than usual.  You cannot do your normal activities.  You tire easily.  You cough more than normal, especially with activity.  You have any or more puffiness (swelling) in  areas such as your hands, feet, ankles, or belly (abdomen).  You cannot sleep because it is hard to breathe.  You feel like your heart is beating fast (palpitations).  You get dizzy or light-headed when you stand up. GET HELP RIGHT AWAY IF:   You have trouble breathing.  There is a change in mental status, such as becoming less alert or not being able to focus.  You have chest pain or discomfort.  You faint. MAKE SURE YOU:   Understand these instructions.  Will watch your condition.  Will get help right away if you are not doing well or get worse.   This information is not intended to replace advice given to you by your health care provider. Make sure you discuss any questions you have with your health care provider.   Document Released: 12/07/2007 Document Revised: 03/20/2014 Document Reviewed: 04/15/2012 Elsevier Interactive Patient Education 2016 Elsevier Inc.  Low-Sodium Eating Plan Sodium raises blood pressure and causes water to be held in the body. Getting less sodium from food will help lower your blood pressure, reduce any swelling, and protect your heart, liver, and kidneys. We get sodium by adding salt (sodium chloride) to food. Most of our sodium comes from canned, boxed, and frozen foods. Restaurant foods, fast foods, and pizza are also very high in sodium. Even if you take medicine to lower your blood pressure or to reduce fluid in your body, getting less sodium from your food is important. WHAT IS MY PLAN? Most people should limit their sodium intake to 2,300 mg a day. Your health care provider recommends that you limit your sodium intake to __________ a day.  WHAT DO I NEED TO KNOW ABOUT THIS EATING PLAN? For the low-sodium eating plan, you will follow these general guidelines:  Choose foods with a % Daily Value for sodium of less than 5% (as listed on the food label).   Use salt-free seasonings or herbs instead of table salt or sea salt.   Check with your  health care provider or pharmacist before using salt substitutes.   Eat fresh foods.  Eat more vegetables and fruits.  Limit canned vegetables. If you do use them, rinse them well to decrease the sodium.   Limit cheese to 1 oz (28 g) per day.   Eat lower-sodium products, often labeled as "lower sodium" or "no salt added."  Avoid foods that contain monosodium glutamate (MSG). MSG is sometimes added to [place] food and some canned foods.  Check food labels (Nutrition Facts labels) on foods to learn how much sodium is in one serving.  Eat more home-cooked food and less restaurant, buffet, and fast food.  When eating at a restaurant, ask that your food be prepared with less salt, or no salt  if possible.  HOW DO I READ FOOD LABELS FOR SODIUM INFORMATION? The Nutrition Facts label lists the amount of sodium in one serving of the food. If you eat more than one serving, you must multiply the listed amount of sodium by the number of servings. Food labels may also identify foods as:  Sodium free--Less than 5 mg in a serving.  Very low sodium--35 mg or less in a serving.  Low sodium--140 mg or less in a serving.  Light in sodium--50% less sodium in a serving. For example, if a food that usually has 300 mg of sodium is changed to become light in sodium, it will have 150 mg of sodium.  Reduced sodium--25% less sodium in a serving. For example, if a food that usually has 400 mg of sodium is changed to reduced sodium, it will have 300 mg of sodium. WHAT FOODS CAN I EAT? Grains Low-sodium cereals, including oats, puffed wheat and rice, and shredded wheat cereals. Low-sodium crackers. Unsalted rice and pasta. Lower-sodium bread.  Vegetables Frozen or fresh vegetables. Low-sodium or reduced-sodium canned vegetables. Low-sodium or reduced-sodium tomato sauce and paste. Low-sodium or reduced-sodium tomato and vegetable juices.  Fruits Fresh, frozen, and canned fruit. Fruit juice.  Meat  and Other Protein Products Low-sodium canned tuna and salmon. Fresh or frozen meat, poultry, seafood, and fish. Lamb. Unsalted nuts. Dried beans, peas, and lentils without added salt. Unsalted canned beans. Homemade soups without salt. Eggs.  Dairy Milk. Soy milk. Ricotta cheese. Low-sodium or reduced-sodium cheeses. Yogurt.  Condiments Fresh and dried herbs and spices. Salt-free seasonings. Onion and garlic powders. Low-sodium varieties of mustard and ketchup. Fresh or refrigerated horseradish. Lemon juice.  Fats and Oils Reduced-sodium salad dressings. Unsalted butter.  Other Unsalted popcorn and pretzels.  The items listed above may not be a complete list of recommended foods or beverages. Contact your dietitian for more options. WHAT FOODS ARE NOT RECOMMENDED? Grains Instant hot cereals. Bread stuffing, pancake, and biscuit mixes. Croutons. Seasoned rice or pasta mixes. Noodle soup cups. Boxed or frozen macaroni and cheese. Self-rising flour. Regular salted crackers. Vegetables Regular canned vegetables. Regular canned tomato sauce and paste. Regular tomato and vegetable juices. Frozen vegetables in sauces. Salted [place] fries. Olives. Rosita Fire. Relishes. Sauerkraut. Salsa. Meat and Other Protein Products Salted, canned, smoked, spiced, or pickled meats, seafood, or fish. Bacon, ham, sausage, hot dogs, corned beef, chipped beef, and packaged luncheon meats. Salt pork. Jerky. Pickled herring. Anchovies, regular canned tuna, and sardines. Salted nuts. Dairy Processed cheese and cheese spreads. Cheese curds. Blue cheese and cottage cheese. Buttermilk.  Condiments Onion and garlic salt, seasoned salt, table salt, and sea salt. Canned and packaged gravies. Worcestershire sauce. Tartar sauce. Barbecue sauce. Teriyaki sauce. Soy sauce, including reduced sodium. Steak sauce. Fish sauce. Oyster sauce. Cocktail sauce. Horseradish that you find on the shelf. Regular ketchup and mustard. Meat  flavorings and tenderizers. Bouillon cubes. Hot sauce. Tabasco sauce. Marinades. Taco seasonings. Relishes. Fats and Oils Regular salad dressings. Salted butter. Margarine. Ghee. Bacon fat.  Other Potato and tortilla chips. Corn chips and puffs. Salted popcorn and pretzels. Canned or dried soups. Pizza. Frozen entrees and pot pies.  The items listed above may not be a complete list of foods and beverages to avoid. Contact your dietitian for more information.   This information is not intended to replace advice given to you by your health care provider. Make sure you discuss any questions you have with your health care provider.   Document Released: 08/19/2001 Document  Revised: 03/20/2014 Document Reviewed: 01/01/2013 Elsevier Interactive Patient Education [employer]2016 Elsevier Inc.

## 2015-01-24 NOTE — Clinical Social Work Note (Signed)
Patient to discharge today to return to his family care home. Patient's mother to transport and Fl2 provided. Patient to go to his mother's house and then will go to the family care home. [name], the owner, of the family care home is aware and in agreement. [name] MSW,LCSW [phone]

## 2015-01-24 NOTE — NC FL2 (Signed)
  [place] MEDICAID FL2 LEVEL OF CARE SCREENING TOOL     IDENTIFICATION  Patient Name: Walter Wong Birthdate: 07/26/1985 Sex: male Admission Date (Current Location): 01/22/2015  [facility] [place] [facility]     Facility and Address:  [facility], [address], [place], [place] [address]      Provider Number: [id number]  Attending Physician Name and Address:  Katha HammingSnehalatha Konidena, MD  Relative Name and Phone Number:       Current Level of Care: Hospital Recommended Level of Care: Family Care Home Prior Approval Number:    Date Approved/Denied:   PASRR Number:    Discharge Plan:  (group home)    Current Diagnoses: Patient Active Problem List   Diagnosis Date Noted  . CHF (congestive heart failure) (HCC) 01/22/2015  . Bipolar 1 disorder, mixed, moderate (HCC) 11/12/2014    Orientation ACTIVITIES/SOCIAL BLADDER RESPIRATION    Self, Time, Situation, Place  Active Continent Normal  BEHAVIORAL SYMPTOMS/MOOD NEUROLOGICAL BOWEL NUTRITION STATUS   (none)  (none) Continent Diet  PHYSICIAN VISITS COMMUNICATION OF NEEDS Height & Weight Skin  30 days Verbally   205 lbs. Normal          AMBULATORY STATUS RESPIRATION    Assist independent Normal      Personal Care Assistance Level of Assistance               Functional Limitations Info   (none)             SPECIAL CARE FACTORS FREQUENCY                      Additional Factors Info  Code Status, Allergies, Psychotropic Code Status Info: Full Allergies Info: NKA           Medication List    TAKE these medications       carbamazepine 200 MG tablet  Commonly known as: TEGRETOL  Take 1 tablet (200 mg total) by mouth 2 (two) times daily.     furosemide 20 MG tablet  Commonly known as: LASIX  Take 1 tablet (20 mg total) by mouth 2 (two) times daily.     QUEtiapine 300 MG tablet  Commonly known as: SEROQUEL  Take 600 mg by mouth at bedtime.      QUEtiapine 100 MG tablet  Commonly known as: SEROQUEL  Take 1 tablet (100 mg total) by mouth daily.     thiothixene 5 MG capsule  Commonly known as: NAVANE  Take 1 capsule (5 mg total) by mouth 2 (two) times daily.               Additional Information    York SpanielMonica Dejha King, LCSW

## 2015-01-24 NOTE — Progress Notes (Signed)
Pt. Discharged to home/[facility] via [facility]. Discharge instructions and medication regimen reviewed at bedside with patient. Pt. verbalizes understanding of instructions and medication regimen. Prescriptions sent to pharmacy; pt is aware. Patient assessment unchanged from this morning. TELE and IV discontinued per policy.

## 2015-02-19 DIAGNOSIS — I5033 Acute on chronic diastolic (congestive) heart failure: Secondary | ICD-10-CM | POA: Insufficient documentation

## 2015-03-09 ENCOUNTER — Ambulatory Visit: Payer: Self-pay | Admitting: Psychiatry

## 2015-05-13 ENCOUNTER — Ambulatory Visit (INDEPENDENT_AMBULATORY_CARE_PROVIDER_SITE_OTHER): Payer: Medicare Other | Admitting: Psychiatry

## 2015-05-13 ENCOUNTER — Encounter: Payer: Self-pay | Admitting: Psychiatry

## 2015-05-13 VITALS — BP 110/78 | Temp 97.4°F | Ht 66.0 in | Wt 216.8 lb

## 2015-05-13 DIAGNOSIS — F250 Schizoaffective disorder, bipolar type: Secondary | ICD-10-CM | POA: Diagnosis not present

## 2015-05-13 MED ORDER — QUETIAPINE FUMARATE 100 MG PO TABS: 100.0000 mg | ORAL_TABLET | Freq: Every day | ORAL | Status: AC

## 2015-05-13 MED ORDER — QUETIAPINE FUMARATE 300 MG PO TABS: 600.0000 mg | ORAL_TABLET | Freq: Every day | ORAL | Status: AC

## 2015-05-13 MED ORDER — CARBAMAZEPINE 200 MG PO TABS: 200.0000 mg | ORAL_TABLET | Freq: Two times a day (BID) | ORAL | Status: AC

## 2015-05-13 MED ORDER — THIOTHIXENE 10 MG PO CAPS: 10.0000 mg | ORAL_CAPSULE | Freq: Two times a day (BID) | ORAL | Status: AC

## 2015-05-13 NOTE — Progress Notes (Signed)
[facility] MD Progress Note  05/13/2015 2:10 PM Walter Wong  MRN:  [id number] Subjective:  Consult 30 year old man with history of schizoaffective disorder. Patient interviewed. Vital signs reviewed. Medicines reviewed. Patient reports that since our last visit he has moved out of the group home and is now living with his mother again. This is been going on about a month. He is generally pleased with the situation although he is not currently working and it doesn't sound like he's pursuing work. It also sounds like he is having more paranoid thoughts about his brother although he has maintained insight and has no thoughts of acting on it. He has no medicine side effects. Since our last visit he did have an admission to the hospital for congestive heart failure. It's unclear what might be behind that and I don't know that they worked it up although he does have a primary care doctor at this point. Patient is requesting that we try altering some of his medicines to improve his paranoid symptoms. Principal Problem: @ Diagnosis:   Patient Active Problem List   Diagnosis Date Noted  . Acute on chronic diastolic heart failure (HCC) [I50.33] 02/19/2015  . CHF (congestive heart failure) (HCC) [I50.9] 01/22/2015  . Heart failure (HCC) [I50.9] 01/22/2015  . Bipolar 1 disorder, mixed, moderate (HCC) [F31.62] 11/12/2014  . Bipolar disorder, current episode mixed, moderate (HCC) [F31.62] 11/12/2014   Total Time spent with patient: 25 minutes  Past Psychiatric History: Patient has a history of schizoaffective disorder with a tendency towards paranoia. No history of suicide attempts. He does have a history of some agitation and aggression with his brother.  Past Medical History:  Past Medical History  Diagnosis Date  . ADHD (attention deficit hyperactivity disorder)   . Diabetes mellitus, type II (HCC)   . Bipolar disorder (HCC)   . Schizophrenia (HCC)   . Chronic kidney disease     Past Surgical  History  Procedure Laterality Date  . Dental surgery     Family History:  Family History  Problem Relation Age of Onset  . Kidney failure Mother   . Congestive Heart Failure Mother    Family Psychiatric  History: No known family history of mental illness has ever been identified Social History:  History  Alcohol Use No     History  Drug Use No    Social History   Social History  . Marital Status: Single    Spouse Name: N/A  . Number of Children: N/A  . Years of Education: N/A   Social History Main Topics  . Smoking status: Never Smoker   . Smokeless tobacco: Never Used  . Alcohol Use: No  . Drug Use: No  . Sexual Activity: No   Other Topics Concern  . None   Social History Narrative   Additional Social History:                         Sleep: Good  Appetite:  Good  Current Medications: Current Outpatient Prescriptions  Medication Sig Dispense Refill  . carbamazepine (TEGRETOL) 200 MG tablet Take 1 tablet (200 mg total) by mouth 2 (two) times daily. 60 tablet 5  . furosemide (LASIX) 20 MG tablet Take 1 tablet (20 mg total) by mouth 2 (two) times daily. 30 tablet 0  . QUEtiapine (SEROQUEL) 100 MG tablet Take 1 tablet (100 mg total) by mouth daily. 30 tablet 5  . QUEtiapine (SEROQUEL) 300 MG tablet Take 2 tablets (  600 mg total) by mouth at bedtime. 60 tablet 5  . thiothixene (NAVANE) 10 MG capsule Take 1 capsule (10 mg total) by mouth 2 (two) times daily. 60 capsule 60   No current facility-administered medications for this visit.    Lab Results: No results found for this or any previous visit (from the past 48 hour(s)).  Blood Alcohol level:  No results found for: [facility]  Physical Findings: AIMS:  , ,  ,  ,    CIWA:    COWS:     Musculoskeletal: Strength & Muscle Tone: within normal limits Gait & Station: normal Patient leans: N/A  Psychiatric Specialty Exam: ROS  Blood pressure 110/78, temperature 97.4 F (36.3 C), temperature source  Tympanic, height  (1.676 m), weight 216 lb 12.8 oz (98.34 kg).Body mass index is 35.01 kg/(m^2).  General Appearance: Fairly Groomed  Patent attorney::  Good  Speech:  Clear and Coherent  Volume:  Normal  Mood:  Euthymic  Affect:  Congruent  Thought Process:  Goal Directed  Orientation:  Full (Time, Place, and Person)  Thought Content:  Paranoid Ideation  Suicidal Thoughts:  No  Homicidal Thoughts:  No  Memory:  Immediate;   Good Recent;   Good Remote;   Good  Judgement:  Good  Insight:  Good  Psychomotor Activity:  Normal  Concentration:  Good  Recall:  Good  Fund of Knowledge:Good  Language: Good  Akathisia:  No  Handed:  Right  AIMS (if indicated):     Assets:  Communication Skills Desire for Improvement Financial Resources/Insurance Housing Resilience Social Support  ADL's:  Intact  Cognition: WNL  Sleep:      Treatment Plan Summary: Medication management and Plan we reviewed his medicine. He is taking 600 mg of Seroquel at night and 100 mg in the day. Still taking Tegretol twice a day and Navane twice a day. No side effects no sign of tardive dyskinesia. Patient was advised that if we wanted to try and improve some of his paranoid symptoms we could try increasing his not vein which is what I recommend. He agrees to the plan to increase Navane to 10 mg twice a day. Side effects reviewed. See him back in 6 months. He knows that if he is getting more paranoid or has any concerns he can call me sooner.  Mordecai Rasmussen, MD 05/13/2015, 2:10 PM

## 2015-08-17 ENCOUNTER — Emergency Department: Payer: Medicare Other

## 2015-08-17 ENCOUNTER — Inpatient Hospital Stay
Admission: EM | Admit: 2015-08-17 | Discharge: 2015-08-20 | DRG: 871 | Disposition: A | Discharge status: Home Health | Payer: Medicare Other | Attending: Internal Medicine | Admitting: Internal Medicine

## 2015-08-17 ENCOUNTER — Encounter: Payer: Self-pay | Admitting: Occupational Medicine

## 2015-08-17 DIAGNOSIS — F909 Attention-deficit hyperactivity disorder, unspecified type: Secondary | ICD-10-CM | POA: Diagnosis present

## 2015-08-17 DIAGNOSIS — Z841 Family history of disorders of kidney and ureter: Secondary | ICD-10-CM | POA: Diagnosis not present

## 2015-08-17 DIAGNOSIS — Z8249 Family history of ischemic heart disease and other diseases of the circulatory system: Secondary | ICD-10-CM

## 2015-08-17 DIAGNOSIS — F250 Schizoaffective disorder, bipolar type: Secondary | ICD-10-CM | POA: Diagnosis not present

## 2015-08-17 DIAGNOSIS — F259 Schizoaffective disorder, unspecified: Secondary | ICD-10-CM | POA: Diagnosis present

## 2015-08-17 DIAGNOSIS — N269 Renal sclerosis, unspecified: Secondary | ICD-10-CM | POA: Diagnosis present

## 2015-08-17 DIAGNOSIS — Z23 Encounter for immunization: Secondary | ICD-10-CM | POA: Diagnosis not present

## 2015-08-17 DIAGNOSIS — I5033 Acute on chronic diastolic (congestive) heart failure: Secondary | ICD-10-CM | POA: Diagnosis present

## 2015-08-17 DIAGNOSIS — J189 Pneumonia, unspecified organism: Secondary | ICD-10-CM | POA: Diagnosis not present

## 2015-08-17 DIAGNOSIS — J470 Bronchiectasis with acute lower respiratory infection: Secondary | ICD-10-CM | POA: Diagnosis present

## 2015-08-17 DIAGNOSIS — Z87891 Personal history of nicotine dependence: Secondary | ICD-10-CM | POA: Diagnosis not present

## 2015-08-17 DIAGNOSIS — N183 Chronic kidney disease, stage 3 (moderate): Secondary | ICD-10-CM | POA: Diagnosis present

## 2015-08-17 DIAGNOSIS — E1122 Type 2 diabetes mellitus with diabetic chronic kidney disease: Secondary | ICD-10-CM | POA: Diagnosis present

## 2015-08-17 DIAGNOSIS — Z79899 Other long term (current) drug therapy: Secondary | ICD-10-CM

## 2015-08-17 DIAGNOSIS — N179 Acute kidney failure, unspecified: Secondary | ICD-10-CM | POA: Diagnosis present

## 2015-08-17 DIAGNOSIS — R509 Fever, unspecified: Secondary | ICD-10-CM

## 2015-08-17 DIAGNOSIS — J849 Interstitial pulmonary disease, unspecified: Secondary | ICD-10-CM | POA: Diagnosis not present

## 2015-08-17 DIAGNOSIS — A419 Sepsis, unspecified organism: Secondary | ICD-10-CM | POA: Diagnosis present

## 2015-08-17 DIAGNOSIS — R0902 Hypoxemia: Secondary | ICD-10-CM | POA: Diagnosis not present

## 2015-08-17 DIAGNOSIS — F319 Bipolar disorder, unspecified: Secondary | ICD-10-CM | POA: Diagnosis present

## 2015-08-17 LAB — BLOOD GAS, ARTERIAL
ACID-BASE EXCESS: 4.2 mmol/L — AB (ref 0.0–3.0)
Allens test (pass/fail): POSITIVE — AB
Bicarbonate: 28.4 mEq/L — ABNORMAL HIGH (ref 21.0–28.0)
FIO2: 0.36
O2 Saturation: 88.4 %
PCO2 ART: 40 mmHg (ref 32.0–48.0)
PH ART: 7.46 — AB (ref 7.350–7.450)
Patient temperature: 37
pO2, Arterial: 52 mmHg — ABNORMAL LOW (ref 83.0–108.0)

## 2015-08-17 LAB — URINALYSIS COMPLETE WITH MICROSCOPIC (ARMC ONLY)
Bilirubin Urine: NEGATIVE
Glucose, UA: NEGATIVE mg/dL
KETONES UR: NEGATIVE mg/dL
LEUKOCYTES UA: NEGATIVE
Nitrite: NEGATIVE
PH: 6 (ref 5.0–8.0)
PROTEIN: 100 mg/dL — AB
SQUAMOUS EPITHELIAL / LPF: NONE SEEN
Specific Gravity, Urine: 1.005 (ref 1.005–1.030)

## 2015-08-17 LAB — URINE DRUG SCREEN, QUALITATIVE (ARMC ONLY)
AMPHETAMINES, UR SCREEN: NOT DETECTED
BENZODIAZEPINE, UR SCRN: NOT DETECTED
Barbiturates, Ur Screen: NOT DETECTED
Cannabinoid 50 Ng, Ur ~~LOC~~: NOT DETECTED
Cocaine Metabolite,Ur ~~LOC~~: NOT DETECTED
MDMA (ECSTASY) UR SCREEN: NOT DETECTED
METHADONE SCREEN, URINE: NOT DETECTED
Opiate, Ur Screen: NOT DETECTED
Phencyclidine (PCP) Ur S: NOT DETECTED
TRICYCLIC, UR SCREEN: NOT DETECTED

## 2015-08-17 LAB — TSH: TSH: 3.119 u[IU]/mL (ref 0.350–4.500)

## 2015-08-17 LAB — COMPREHENSIVE METABOLIC PANEL
ALBUMIN: 3 g/dL — AB (ref 3.5–5.0)
ALT: 11 U/L — ABNORMAL LOW (ref 17–63)
ANION GAP: 10 (ref 5–15)
AST: 29 U/L (ref 15–41)
Alkaline Phosphatase: 51 U/L (ref 38–126)
BILIRUBIN TOTAL: 0.3 mg/dL (ref 0.3–1.2)
BUN: 25 mg/dL — AB (ref 6–20)
CHLORIDE: 103 mmol/L (ref 101–111)
CO2: 26 mmol/L (ref 22–32)
Calcium: 8.2 mg/dL — ABNORMAL LOW (ref 8.9–10.3)
Creatinine, Ser: 2.6 mg/dL — ABNORMAL HIGH (ref 0.61–1.24)
GFR calc Af Amer: 37 mL/min — ABNORMAL LOW (ref >60)
GFR calc non Af Amer: 32 mL/min — ABNORMAL LOW (ref >60)
GLUCOSE: 116 mg/dL — AB (ref 65–99)
POTASSIUM: 3.7 mmol/L (ref 3.5–5.1)
SODIUM: 139 mmol/L (ref 135–145)
TOTAL PROTEIN: 8.6 g/dL — AB (ref 6.5–8.1)

## 2015-08-17 LAB — CBC WITH DIFFERENTIAL/PLATELET
BASOS ABS: 0.1 10*3/uL (ref 0–0.1)
EOS ABS: 0.1 10*3/uL (ref 0–0.7)
Eosinophils Relative: 1 %
HEMATOCRIT: 37.1 % — AB (ref 40.0–52.0)
Hemoglobin: 11.8 g/dL — ABNORMAL LOW (ref 13.0–18.0)
Lymphocytes Relative: 4 %
Lymphs Abs: 0.6 10*3/uL — ABNORMAL LOW (ref 1.0–3.6)
MCH: 21.7 pg — ABNORMAL LOW (ref 26.0–34.0)
MCHC: 31.7 g/dL — AB (ref 32.0–36.0)
MCV: 68.3 fL — ABNORMAL LOW (ref 80.0–100.0)
MONO ABS: 0.9 10*3/uL (ref 0.2–1.0)
NEUTROS ABS: 11.6 10*3/uL — AB (ref 1.4–6.5)
Neutrophils Relative %: 87 %
PLATELETS: 341 10*3/uL (ref 150–440)
RBC: 5.42 MIL/uL (ref 4.40–5.90)
RDW: 15.2 % — AB (ref 11.5–14.5)
WBC: 13.4 10*3/uL — ABNORMAL HIGH (ref 3.8–10.6)

## 2015-08-17 LAB — ETHANOL: Alcohol, Ethyl (B): <5 mg/dL (ref <5)

## 2015-08-17 LAB — TROPONIN I: TROPONIN I: 0.06 ng/mL — AB (ref <0.031)

## 2015-08-17 LAB — ACETAMINOPHEN LEVEL: Acetaminophen (Tylenol), Serum: <10 ug/mL — ABNORMAL LOW (ref 10–30)

## 2015-08-17 LAB — GLUCOSE, CAPILLARY
Glucose-Capillary: 79 mg/dL (ref 65–99)
Glucose-Capillary: 117 mg/dL — ABNORMAL HIGH (ref 65–99)
Glucose-Capillary: 115 mg/dL — ABNORMAL HIGH (ref 65–99)

## 2015-08-17 LAB — LACTIC ACID, PLASMA: LACTIC ACID, VENOUS: 1.2 mmol/L (ref 0.5–2.0)

## 2015-08-17 LAB — SALICYLATE LEVEL: SALICYLATE LVL: 5.1 mg/dL (ref 2.8–30.0)

## 2015-08-17 LAB — BRAIN NATRIURETIC PEPTIDE: B NATRIURETIC PEPTIDE 5: 30 pg/mL (ref 0.0–100.0)

## 2015-08-17 MED ORDER — QUETIAPINE FUMARATE 100 MG PO TABS
100.0000 mg | ORAL_TABLET | Freq: Every day | ORAL | Status: DC
  Administered 2015-08-18 – 2015-08-20 (×3): 100 mg via ORAL
  Filled 2015-08-17: qty 1
  Filled 2015-08-17: qty 4
  Filled 2015-08-17 (×2): qty 1
  Filled 2015-08-17: qty 4

## 2015-08-17 MED ORDER — LORAZEPAM 2 MG/ML IJ SOLN
0.5000 mg | Freq: Once | INTRAMUSCULAR | Status: AC
  Administered 2015-08-17: 0.5 mg via INTRAVENOUS
  Filled 2015-08-17: qty 1

## 2015-08-17 MED ORDER — DOCUSATE SODIUM 100 MG PO CAPS
100.0000 mg | ORAL_CAPSULE | Freq: Two times a day (BID) | ORAL | Status: DC
  Administered 2015-08-17 – 2015-08-20 (×6): 100 mg via ORAL
  Filled 2015-08-17 (×7): qty 1

## 2015-08-17 MED ORDER — DEXTROSE 5 % IV SOLN: 500.0000 mg | INTRAVENOUS | Status: DC

## 2015-08-17 MED ORDER — ONDANSETRON HCL 4 MG/2ML IJ SOLN: 4.0000 mg | Freq: Four times a day (QID) | INTRAMUSCULAR | Status: DC | PRN

## 2015-08-17 MED ORDER — ONDANSETRON HCL 4 MG PO TABS: 4.0000 mg | ORAL_TABLET | Freq: Four times a day (QID) | ORAL | Status: DC | PRN

## 2015-08-17 MED ORDER — SODIUM CHLORIDE 0.9 % IV SOLN
Freq: Once | INTRAVENOUS | Status: AC
  Administered 2015-08-17: 16:00:00 via INTRAVENOUS

## 2015-08-17 MED ORDER — QUETIAPINE FUMARATE 200 MG PO TABS
600.0000 mg | ORAL_TABLET | Freq: Every day | ORAL | Status: DC
  Administered 2015-08-17 – 2015-08-19 (×3): 600 mg via ORAL
  Filled 2015-08-17 (×5): qty 3

## 2015-08-17 MED ORDER — SODIUM CHLORIDE 0.9 % IV BOLUS (SEPSIS)
1000.0000 mL | Freq: Once | INTRAVENOUS | Status: AC
  Administered 2015-08-17: 1000 mL via INTRAVENOUS

## 2015-08-17 MED ORDER — DEXTROSE 5 % IV SOLN
1.0000 g | INTRAVENOUS | Status: AC
  Administered 2015-08-17: 1 g via INTRAVENOUS
  Filled 2015-08-17: qty 10

## 2015-08-17 MED ORDER — ACETAMINOPHEN 500 MG PO TABS
ORAL_TABLET | ORAL | Status: AC
  Administered 2015-08-17: 1000 mg via ORAL
  Filled 2015-08-17: qty 2

## 2015-08-17 MED ORDER — SODIUM CHLORIDE 0.9 % IV BOLUS (SEPSIS)
500.0000 mL | Freq: Once | INTRAVENOUS | Status: AC
  Administered 2015-08-17: 500 mL via INTRAVENOUS

## 2015-08-17 MED ORDER — IPRATROPIUM-ALBUTEROL 0.5-2.5 (3) MG/3ML IN SOLN: 3.0000 mL | RESPIRATORY_TRACT | Status: DC | PRN

## 2015-08-17 MED ORDER — HEPARIN SODIUM (PORCINE) 5000 UNIT/ML IJ SOLN
5000.0000 [IU] | Freq: Three times a day (TID) | INTRAMUSCULAR | Status: DC
  Administered 2015-08-17: 5000 [IU] via SUBCUTANEOUS
  Filled 2015-08-17 (×2): qty 1

## 2015-08-17 MED ORDER — DEXTROSE 5 % IV SOLN
500.0000 mg | INTRAVENOUS | Status: AC
  Administered 2015-08-18 – 2015-08-19 (×2): 500 mg via INTRAVENOUS
  Filled 2015-08-17 (×2): qty 500

## 2015-08-17 MED ORDER — THIOTHIXENE 10 MG PO CAPS
10.0000 mg | ORAL_CAPSULE | Freq: Two times a day (BID) | ORAL | Status: DC
  Administered 2015-08-17 – 2015-08-20 (×6): 10 mg via ORAL
  Filled 2015-08-17 (×9): qty 1

## 2015-08-17 MED ORDER — DEXTROSE 5 % IV SOLN
500.0000 mg | Freq: Once | INTRAVENOUS | Status: AC
  Administered 2015-08-17: 500 mg via INTRAVENOUS
  Filled 2015-08-17: qty 500

## 2015-08-17 MED ORDER — INSULIN ASPART 100 UNIT/ML ~~LOC~~ SOLN
0.0000 [IU] | Freq: Three times a day (TID) | SUBCUTANEOUS | Status: DC
  Administered 2015-08-18 – 2015-08-19 (×2): 1 [IU] via SUBCUTANEOUS
  Filled 2015-08-17 (×2): qty 1

## 2015-08-17 MED ORDER — ACETAMINOPHEN 500 MG PO TABS
1000.0000 mg | ORAL_TABLET | Freq: Once | ORAL | Status: AC
  Administered 2015-08-17: 1000 mg via ORAL

## 2015-08-17 MED ORDER — DEXTROSE 5 % IV SOLN
1.0000 g | Freq: Once | INTRAVENOUS | Status: DC
  Administered 2015-08-17: 1 g via INTRAVENOUS
  Filled 2015-08-17: qty 10

## 2015-08-17 MED ORDER — CARBAMAZEPINE 200 MG PO TABS
200.0000 mg | ORAL_TABLET | Freq: Two times a day (BID) | ORAL | Status: DC
  Administered 2015-08-17 – 2015-08-20 (×7): 200 mg via ORAL
  Filled 2015-08-17 (×7): qty 1

## 2015-08-17 MED ORDER — IPRATROPIUM-ALBUTEROL 0.5-2.5 (3) MG/3ML IN SOLN
3.0000 mL | Freq: Four times a day (QID) | RESPIRATORY_TRACT | Status: DC
  Administered 2015-08-18 – 2015-08-20 (×10): 3 mL via RESPIRATORY_TRACT
  Filled 2015-08-17 (×10): qty 3

## 2015-08-17 MED ORDER — DEXTROSE 5 % IV SOLN
2.0000 g | INTRAVENOUS | Status: AC
  Administered 2015-08-18 – 2015-08-19 (×2): 2 g via INTRAVENOUS
  Filled 2015-08-17 (×2): qty 2

## 2015-08-17 MED ORDER — DEXTROSE 5 % IV SOLN: 1.0000 g | INTRAVENOUS | Status: DC

## 2015-08-17 MED ORDER — FUROSEMIDE 10 MG/ML IJ SOLN
INTRAMUSCULAR | Status: AC
  Administered 2015-08-17: 40 mg via INTRAVENOUS
  Filled 2015-08-17: qty 4

## 2015-08-17 MED ORDER — PREDNISONE 20 MG PO TABS
20.0000 mg | ORAL_TABLET | Freq: Every day | ORAL | Status: DC
  Administered 2015-08-18: 20 mg via ORAL
  Filled 2015-08-17: qty 1

## 2015-08-17 MED ORDER — ACETAMINOPHEN 650 MG RE SUPP: 650.0000 mg | Freq: Four times a day (QID) | RECTAL | Status: DC | PRN

## 2015-08-17 MED ORDER — ACETAMINOPHEN 325 MG PO TABS: 650.0000 mg | ORAL_TABLET | Freq: Four times a day (QID) | ORAL | Status: DC | PRN

## 2015-08-17 MED ORDER — BISACODYL 10 MG RE SUPP: 10.0000 mg | Freq: Every day | RECTAL | Status: DC | PRN

## 2015-08-17 MED ORDER — PNEUMOCOCCAL VAC POLYVALENT 25 MCG/0.5ML IJ INJ
0.5000 mL | INJECTION | INTRAMUSCULAR | Status: AC
  Administered 2015-08-18: 0.5 mL via INTRAMUSCULAR
  Filled 2015-08-17: qty 0.5

## 2015-08-17 MED ORDER — ALBUTEROL SULFATE (2.5 MG/3ML) 0.083% IN NEBU: 2.5000 mg | INHALATION_SOLUTION | Freq: Four times a day (QID) | RESPIRATORY_TRACT | Status: DC | PRN

## 2015-08-17 MED ORDER — FUROSEMIDE 10 MG/ML IJ SOLN
40.0000 mg | Freq: Once | INTRAMUSCULAR | Status: AC
Start: 2015-08-17 — End: 2015-08-17
  Administered 2015-08-17: 40 mg via INTRAVENOUS

## 2015-08-17 NOTE — ED Provider Notes (Signed)
[facility] Emergency Department Provider Note   ____________________________________________  Time seen: Approximately 5:57 AM  I have reviewed the triage vital signs and the nursing notes.   HISTORY  Chief Complaint Panic Attack    HPI Walter Wong is a 30 y.o. male who presents to the ED from home via EMS with a chief complain of anxiety and shortness of breath. Patient has a history of bipolar disorder, diabetes and congestive heart failure who reports symptoms starting approximately 3 AM after drinking "a lot of water and soda". Patient states this has happened previously and continues to drink both soda and water. Denies emotional upset at the onset of the symptoms. Complains of active cough which started prior to arrival. Denies recent fever, chills, chest pain, abdominal pain, nausea, vomiting, diarrhea.Denies recent travel or trauma. Nothing makes his symptoms better or worse. Does not wear home oxygen.   Past Medical History  Diagnosis Date  . ADHD (attention deficit hyperactivity disorder)   . Diabetes mellitus, type II (HCC)   . Bipolar disorder (HCC)   . Schizophrenia (HCC)   . Chronic kidney disease     Patient Active Problem List   Diagnosis Date Noted  . Acute on chronic diastolic heart failure (HCC) 02/19/2015  . CHF (congestive heart failure) (HCC) 01/22/2015  . Heart failure (HCC) 01/22/2015  . Bipolar 1 disorder, mixed, moderate (HCC) 11/12/2014  . Bipolar disorder, current episode mixed, moderate (HCC) 11/12/2014    Past Surgical History  Procedure Laterality Date  . Dental surgery      Current Outpatient Rx  Name  Route  Sig  Dispense  Refill  . carbamazepine (TEGRETOL) 200 MG tablet   Oral   Take 1 tablet (200 mg total) by mouth 2 (two) times daily.   60 tablet   5   . furosemide (LASIX) 20 MG tablet   Oral   Take 1 tablet (20 mg total) by mouth 2 (two) times daily.   30 tablet   0   . QUEtiapine (SEROQUEL)  100 MG tablet   Oral   Take 1 tablet (100 mg total) by mouth daily.   30 tablet   5   . QUEtiapine (SEROQUEL) 300 MG tablet   Oral   Take 2 tablets (600 mg total) by mouth at bedtime.   60 tablet   5   . thiothixene (NAVANE) 10 MG capsule   Oral   Take 1 capsule (10 mg total) by mouth 2 (two) times daily.   60 capsule   60     Allergies Review of patient's allergies indicates no known allergies.  Family History  Problem Relation Age of Onset  . Kidney failure Mother   . Congestive Heart Failure Mother     Social History Social History  Substance Use Topics  . Smoking status: Never Smoker   . Smokeless tobacco: Never Used  . Alcohol Use: No    Review of Systems  Constitutional: No fever/chills. Eyes: No visual changes. ENT: No sore throat. Cardiovascular: Denies chest pain. Respiratory: Positive for shortness of breath. Gastrointestinal: No abdominal pain.  No nausea, no vomiting.  No diarrhea.  No constipation. Genitourinary: Negative for dysuria. Musculoskeletal: Negative for back pain. Skin: Negative for rash. Neurological: Negative for headaches, focal weakness or numbness. Psychiatric:Positive for anxiety.  10-point ROS otherwise negative.  ____________________________________________   PHYSICAL EXAM:  VITAL SIGNS: ED Triage Vitals  Enc Vitals Group     BP 08/17/15 0533 143/95 mmHg  Pulse Rate 08/17/15 0553 140     Resp 08/17/15 0537 35     Temp 08/17/15 0533 98.6 F (37 C)     Temp src --      SpO2 08/17/15 0530 98 %     Weight 08/17/15 0533 217 lb (98.431 kg)     Height 08/17/15 0533 5\' 6"  (1.676 m)     Head Cir --      Peak Flow --      Pain Score --      Pain Loc --      Pain Edu? --      Excl. in GC? --     Constitutional: Alert and oriented. Well appearing and in moderate acute distress. Anxious. Hyperventilating. Eyes: Conjunctivae are normal. PERRL. EOMI. Head: Atraumatic. Nose: No congestion/rhinnorhea. Mouth/Throat:  Mucous membranes are moist.  Oropharynx non-erythematous. Neck: No stridor.   Cardiovascular: Tachycardic rate, regular rhythm. Grossly normal heart sounds.  Good peripheral circulation. Respiratory: Increased respiratory effort.  No retractions. Lungs slightly diminished bilaterally. Gastrointestinal: Soft and nontender. No distention. No abdominal bruits. No CVA tenderness. Musculoskeletal: No lower extremity tenderness nor edema.  No joint effusions. Neurologic:  Normal speech and language. No gross focal neurologic deficits are appreciated.  Skin:  Skin is hot, dry and intact. No rash noted. Psychiatric: Mood and affect are anxious. Speech and behavior are normal.  ____________________________________________   LABS (all labs ordered are listed, but only abnormal results are displayed)  Labs Reviewed  CBC WITH DIFFERENTIAL/PLATELET - Abnormal; Notable for the following:    WBC 13.4 (*)    Hemoglobin 11.8 (*)    HCT 37.1 (*)    MCV 68.3 (*)    MCH 21.7 (*)    MCHC 31.7 (*)    RDW 15.2 (*)    Neutro Abs 11.6 (*)    Lymphs Abs 0.6 (*)    All other components within normal limits  ACETAMINOPHEN LEVEL - Abnormal; Notable for the following:    Acetaminophen (Tylenol), Serum <10 (*)    All other components within normal limits  BLOOD GAS, ARTERIAL - Abnormal; Notable for the following:    pH, Arterial 7.46 (*)    pO2, Arterial 52 (*)    Bicarbonate 28.4 (*)    Acid-Base Excess 4.2 (*)    Allens test (pass/fail) POSITIVE (*)    All other components within normal limits  CULTURE, BLOOD (ROUTINE X 2)  CULTURE, BLOOD (ROUTINE X 2)  URINE CULTURE  ETHANOL  SALICYLATE LEVEL  COMPREHENSIVE METABOLIC PANEL  TROPONIN I  BRAIN NATRIURETIC PEPTIDE  TSH  LACTIC ACID, PLASMA  LACTIC ACID, PLASMA  URINALYSIS COMPLETEWITH MICROSCOPIC ([facility] ONLY)  URINE DRUG SCREEN, QUALITATIVE ([facility] ONLY)   ____________________________________________  EKG  ED ECG REPORT I, Marcele Kosta J, the  attending physician, personally viewed and interpreted this ECG.   Date: 08/17/2015  EKG Time: 0539  Rate: 147  Rhythm: sinus tachycardia  Axis: Normal  Intervals:none  ST&T Change: Nonspecific  ____________________________________________  RADIOLOGY  Portable chest x-ray (viewed by me, interpreted per Dr. Grace IsaacWatts): Low volume chest with coarse basilar opacities similar to 2016. This could reflect recurrent pneumonia/aspiration or chronic process with fibrosis. Recommend continued surveillance. ____________________________________________   PROCEDURES  Procedure(s) performed: None  Critical Care performed:   CRITICAL CARE Performed by: Irean HongSUNG,Ajayla Iglesias J   Total critical care time: 30 minutes  Critical care time was exclusive of separately billable procedures and treating other patients.  Critical care was necessary to treat or prevent imminent  or life-threatening deterioration.  Critical care was time spent personally by me on the following activities: development of treatment plan with patient and/or surrogate as well as nursing, discussions with consultants, evaluation of patient's response to treatment, examination of patient, obtaining history from patient or surrogate, ordering and performing treatments and interventions, ordering and review of laboratory studies, ordering and review of radiographic studies, pulse oximetry and re-evaluation of patient's condition.  ____________________________________________   INITIAL IMPRESSION / ASSESSMENT AND PLAN / ED COURSE  Pertinent labs & imaging results that were available during my care of the patient were reviewed by me and considered in my medical decision making (see chart for details).  30 year old male with a history of bipolar disorder, diabetes and congestive heart failure who presents with "panic attack". Patient arrives hyperventilating, tachycardic, tachypneic and hypoxic to 80s%. Skin is hot to the touch. Rectal  temperature  102F. Will obtain screening lab work including blood cultures, ABG, chest x-ray. Initiate judicious IV fluid resuscitation given patient's history of congestive heart failure, oral Tylenol and reassess. Anticipate hospital admission.  ----------------------------------------- 7:13 AM on 08/17/2015 -----------------------------------------  On 4 L nasal cannula oxygen, patient's saturations ranging from 88-96%. ABG notable for PO2 of 52. Patient continues to be tachypneic, although resting slightly more comfortably than previously. ED code sepsis initiated. We will trial BiPAP. Discussed with hospitalist to evaluate patient in the emergency department for admission. ____________________________________________   FINAL CLINICAL IMPRESSION(S) / ED DIAGNOSES  Final diagnoses:  Fever, unspecified fever cause  Hypoxia  Sepsis, due to unspecified organism (HCC)  CAP (community acquired pneumonia)      NEW MEDICATIONS STARTED DURING THIS VISIT:  New Prescriptions   No medications on file     Note:  This document was prepared using Dragon voice recognition software and may include unintentional dictation errors.    Irean Hong, MD 08/17/15 [phone]

## 2015-08-17 NOTE — Progress Notes (Signed)
New admit from ER with pneumonia, receiving IV fluids, up with stand by assist, afebrile, denies pain, from group home, on 4 l of acute oxygen.

## 2015-08-17 NOTE — ED Notes (Signed)
CODE SEPSIS CALLED TO DOUG AT [facility] 

## 2015-08-17 NOTE — Progress Notes (Signed)
Pharmacy Antibiotic Note  Walter Wong is a 30 y.o. male admitted on 08/17/2015 with pneumonia.  Pharmacy has been consulted for ceftriaxone and azithromycin dosing.  Plan: 1. Azithromycin 500 mg IV Q24H 2. Ceftriaxone 2 gm IV Q24H  Height: 5\' 6"  (167.6 cm) Weight: 217 lb (98.431 kg) IBW/kg (Calculated) : 63.8  Temp (24hrs), Avg:100.4 F (38 C), Min:98.6 F (37 C), Max:102.2 F (39 C)   Recent Labs Lab 08/17/15 0605  WBC 13.4*    CrCl cannot be calculated (Patient has no serum creatinine result on file.).    No Known Allergies   Thank you for allowing pharmacy to be a part of this patient's care.  Walter Wong, Pharm.D., BCPS Clinical Pharmacist 08/17/2015 7:21 AM

## 2015-08-17 NOTE — Consult Note (Signed)
Name: Walter Wong MRN: [id number] DOB: 06-12-1985    ADMISSION DATE:  08/17/2015 CONSULTATION DATE:  08/17/15  REFERRING MD :  Eliane Decree  CHIEF COMPLAINT: SOB  BRIEF PATIENT DESCRIPTION: 30 yo male with  ADHD,  Diabetes mellitus, bipolar disorder, schizophrenia, chronic kidney disease now presenting with increased shortness of breath secondary to pneumonia/congestive heart failure. Marland Kitchen  SIGNIFICANT EVENTS  none  STUDIES:  None   HISTORY OF PRESENT ILLNESS: Walter Wong and is a 30 year old male with past medical history significant for ADHD, diabetes, bipolar disorder, chronic kidney disease. Patient lives in a group home. Patient presented to ED with fever off 102.40F. Patient denies any chest pain abdominal pain nausea vomiting or diarrhea. Chest x-ray was consistent with increasing bilateral infiltrates concerning for pneumonia. Patient denies any history of smoking, alcohol or any other drug abuse. PAST MEDICAL HISTORY :   has a past medical history of ADHD (attention deficit hyperactivity disorder); Diabetes mellitus, type II (HCC); Bipolar disorder (HCC); Schizophrenia (HCC); and Chronic kidney disease.  has past surgical history that includes Dental surgery. Prior to Admission medications   Medication Sig Start Date End Date Taking? Authorizing Provider  carbamazepine (TEGRETOL) 200 MG tablet Take 1 tablet (200 mg total) by mouth 2 (two) times daily. 05/13/15  Yes Audery Amel, MD  furosemide (LASIX) 20 MG tablet Take 1 tablet (20 mg total) by mouth 2 (two) times daily. 01/24/15  Yes Katha Hamming, MD  QUEtiapine (SEROQUEL) 100 MG tablet Take 1 tablet (100 mg total) by mouth daily. 05/13/15  Yes Audery Amel, MD  QUEtiapine (SEROQUEL) 300 MG tablet Take 2 tablets (600 mg total) by mouth at bedtime. 05/13/15  Yes Audery Amel, MD  thiothixene (NAVANE) 10 MG capsule Take 1 capsule (10 mg total) by mouth 2 (two) times daily. 05/13/15  Yes Audery Amel, MD   No Known  Allergies  FAMILY HISTORY:  family history includes Congestive Heart Failure in his mother; Kidney failure in his mother. SOCIAL HISTORY:  reports that he has never smoked. He has never used smokeless tobacco. He reports that he does not drink alcohol or use illicit drugs.  REVIEW OF SYSTEMS:   Constitutional: Negative for fever, chills, weight loss, malaise/fatigue and diaphoresis.  HENT: Negative for hearing loss, ear pain, nosebleeds, congestion, sore throat, neck pain, tinnitus and ear discharge.   Eyes: Negative for blurred vision, double vision, photophobia, pain, discharge and redness.  Respiratory: Negative for cough, hemoptysis, sputum production, shortness of breath, wheezing and stridor.   Cardiovascular: Negative for chest pain, palpitations, orthopnea, claudication, leg swelling and PND.  Gastrointestinal: Negative for heartburn, nausea, vomiting, abdominal pain, diarrhea, constipation, blood in stool and melena.  Genitourinary: Negative for dysuria, urgency, frequency, hematuria and flank pain.  Musculoskeletal: Negative for myalgias, back pain, joint pain and falls.  Skin: Negative for itching and rash.  Neurological: Negative for dizziness, tingling, tremors, sensory change, speech change, focal weakness, seizures, loss of consciousness, weakness and headaches.  Endo/Heme/Allergies: Negative for environmental allergies and polydipsia. Does not bruise/bleed easily.  SUBJECTIVE:   VITAL SIGNS: Temp:  [97.5 F (36.4 C)-102.2 F (39 C)] 97.5 F (36.4 C) (06/06 1533) Pulse Rate:  [86-142] 86 (06/06 1533) Resp:  [19-48] 22 (06/06 1533) BP: (96-154)/(64-107) 123/91 mmHg (06/06 1533) SpO2:  [75 %-100 %] 100 % (06/06 1533) FiO2 (%):  [40 %] 40 % (06/06 0800) Weight:  [186 lb (84.369 kg)-217 lb (98.431 kg)] 186 lb (84.369 kg) (06/06 1533)  PHYSICAL EXAMINATION:  General:  Young African-American male, found ambulating in the room, appears to be in no acute distress Neuro:   Awake, alert, oriented, no focal deficit HEENT:  Atraumatic, normocephalic, no discharge Cardiovascular: S1-S2, regular, no JVD appreciated Lungs:  Diminished lung sounds bilaterally, no wheezes crackles or rhonchi noted Abdomen:  Obese, round, positive bowel sounds Musculoskeletal:   no inflammation or deformity noted Skin:  Grossly intact   Recent Labs Lab 08/17/15 0605  NA 139  K 3.7  CL 103  CO2 26  BUN 25*  CREATININE 2.60*  GLUCOSE 116*    Recent Labs Lab 08/17/15 0605  HGB 11.8*  HCT 37.1*  WBC 13.4*  PLT 341   Dg Chest Port 1 View  08/17/2015  CLINICAL DATA:  Shortness of breath EXAM: PORTABLE CHEST 1 VIEW COMPARISON:  01/22/2015 FINDINGS: Low volume chest with coarse opacities at the bases. Normal heart size and mediastinal contours for technique. IMPRESSION: Low volume chest with coarse basilar opacities similar to 2016. This could reflect recurrent pneumonia/aspiration or chronic process with fibrosis. Recommend continued surveillance. Electronically Signed   By: Marnee SpringJonathon  Watts M.D.   On: 08/17/2015 06:30    ASSESSMENT / PLAN: 1.Community-acquired pneumonia 2 leukocytosis related to pneumonia 3. Chronic kidney disease  Plan -Continue ceftriaxone, azithromycin -gentle diuresis  -Flutter valve/incentive spirometer -Follow culture -DuoNeb when necessary - trend BUN/ Cr. -CXR in am   Bincy Varughese,AG-ACNP Pulmonary & Critical Care  STAFF NOTE: I, Dr. Lucie LeatherKurian David Elsworth Ledin,  have personally reviewed patient's available data, including medical history, events of note, physical examination and test results as part of my evaluation. I have discussed with NP and other care providers such as pharmacist, RN and RRT.  In addition,  I personally evaluated patient and elicited key findings  +wheezing,crackles at bases   A:acute pneumonia  P:consider CT chest, check HIV Will start steroids      The Rest per NP whose note is outlined above and that I agree  with  I have personally reviewed/obtained a history, examined the patient, evaluated Pertinent laboratory and Radio[employer]ic/imaging results, and  formulated the assessment and plan   The Patient requires high complexity decision making for assessment and support, frequent evaluation and titration of therapies, application of advanced monitoring technologies and extensive interpretation of multiple databases.   Lucie LeatherKurian David Susa Bones, M.D.  [facility]  Medical Director [facility] Medical Director [facility]

## 2015-08-17 NOTE — H&P (Signed)
[facility] - [place] at [facility]   PATIENT NAME: Walter Wong    MR#:  [id number]  DATE OF BIRTH:  December 31, 1985  DATE OF ADMISSION:  08/17/2015  PRIMARY CARE PHYSICIAN: Mickel Fuchs, MD   REQUESTING/REFERRING PHYSICIAN: Dr. Dolores Frame  CHIEF COMPLAINT:   Increasing shortness of breath fever of 102 HISTORY OF PRESENT ILLNESS:  Walter Wong  is a 30 y.o. male with a known history of Type 2 diabetes not on medication, bipolar disorder, schizophrenia, chronic kidney disease stage III, ADHD presents to the emergency room from group home with increasing shortness of breath getting worse over last few days along with fever of 102.2 when rectal temperature was taken in the emergency room. Patient's chest x-ray shows bilateral increased infiltrates consistent with either pneumonia and/or underlying congestive heart failure. He is BNP is 30. He has history of chronic diastolic congestive heart failure with echo done in November 2016 revealing EF of 60%. Patient takes Lasix on a daily basis. He received broad-spectrum antibiotics with Rocephin and Zithromax in the emergency room initially was on 4 L had to be placed on BiPAP for several hours. Currently during my evaluation patient felt better she is satting 98 -100% on 4 L nasal cannula oxygen. Patient is being admitted for sepsis secondary to pneumonia and possible underlying acute on chronic diastolic congestive heart failure exacerbation. Patient also reports drinking excessive amount of water and lot of regular sodas. He reports his mother brings him the drinks to the group home PAST MEDICAL HISTORY:   Past Medical History  Diagnosis Date  . ADHD (attention deficit hyperactivity disorder)   . Diabetes mellitus, type II (HCC)   . Bipolar disorder (HCC)   . Schizophrenia (HCC)   . Chronic kidney disease     PAST SURGICAL HISTOIRY:   Past Surgical History  Procedure Laterality Date  . Dental surgery      SOCIAL  HISTORY:   Social History  Substance Use Topics  . Smoking status: Never Smoker   . Smokeless tobacco: Never Used  . Alcohol Use: No    FAMILY HISTORY:   Family History  Problem Relation Age of Onset  . Kidney failure Mother   . Congestive Heart Failure Mother     DRUG ALLERGIES:  No Known Allergies  REVIEW OF SYSTEMS:  Review of Systems  Constitutional: Positive for fever. Negative for chills and weight loss.  HENT: Negative for ear discharge, ear pain and nosebleeds.   Eyes: Negative for blurred vision, pain and discharge.  Respiratory: Positive for cough and shortness of breath. Negative for sputum production, wheezing and stridor.   Cardiovascular: Negative for chest pain, palpitations, orthopnea and PND.  Gastrointestinal: Negative for nausea, vomiting, abdominal pain and diarrhea.  Genitourinary: Negative for urgency and frequency.  Musculoskeletal: Negative for back pain and joint pain.  Neurological: Positive for weakness. Negative for sensory change, speech change and focal weakness.  Psychiatric/Behavioral: Negative for depression and hallucinations. The patient is not nervous/anxious.   All other systems reviewed and are negative.    MEDICATIONS AT HOME:   Prior to Admission medications   Medication Sig Start Date End Date Taking? Authorizing Provider  carbamazepine (TEGRETOL) 200 MG tablet Take 1 tablet (200 mg total) by mouth 2 (two) times daily. 05/13/15  Yes Audery Amel, MD  furosemide (LASIX) 20 MG tablet Take 1 tablet (20 mg total) by mouth 2 (two) times daily. 01/24/15  Yes Katha Hamming, MD  QUEtiapine (SEROQUEL) 100 MG tablet  Take 1 tablet (100 mg total) by mouth daily. 05/13/15  Yes Audery Amel, MD  QUEtiapine (SEROQUEL) 300 MG tablet Take 2 tablets (600 mg total) by mouth at bedtime. 05/13/15  Yes Audery Amel, MD  thiothixene (NAVANE) 10 MG capsule Take 1 capsule (10 mg total) by mouth 2 (two) times daily. 05/13/15  Yes Audery Amel, MD       VITAL SIGNS:  Blood pressure 139/106, pulse 96, temperature 98.6 F (37 C), temperature source Rectal, resp. rate 30, height  (1.676 m), weight 98.431 kg (217 lb), SpO2 100 %.  PHYSICAL EXAMINATION:  GENERAL:  30 y.o.-year-old patient lying in the bed with no acute distress. Obese EYES: Pupils equal, round, reactive to light and accommodation. No scleral icterus. Extraocular muscles intact.  HEENT: Head atraumatic, normocephalic. Oropharynx and nasopharynx clear.  NECK:  Supple, no jugular venous distention. No thyroid enlargement, no tenderness.  LUNGS: Distant breath sounds breath sounds bilaterally, no wheezing, rales,rhonchi or crepitation. No use of accessory muscles of respiration.  CARDIOVASCULAR: S1, S2 normal. No murmurs, rubs, or gallops. Tachycardia ABDOMEN: Soft, nontender, nondistended. Bowel sounds present. No organomegaly or mass.  EXTREMITIES: No pedal edema, cyanosis, or clubbing.  NEUROLOGIC: Cranial nerves II through XII are intact. Muscle strength 5/5 in all extremities. Sensation intact. Gait not checked.  PSYCHIATRIC: The patient is alert and oriented x 2  SKIN: No obvious rash, lesion, or ulcer.   LABORATORY PANEL:   CBC  Recent Labs Lab 08/17/15 0605  WBC 13.4*  HGB 11.8*  HCT 37.1*  PLT 341   ------------------------------------------------------------------------------------------------------------------  Chemistries   Recent Labs Lab 08/17/15 0605  NA 139  K 3.7  CL 103  CO2 26  GLUCOSE 116*  BUN 25*  CREATININE 2.60*  CALCIUM 8.2*  AST 29  ALT 11*  ALKPHOS 51  BILITOT 0.3   ------------------------------------------------------------------------------------------------------------------  Cardiac Enzymes  Recent Labs Lab 08/17/15 0605  TROPONINI 0.06*   ------------------------------------------------------------------------------------------------------------------  RADIOLOGY:  Dg Chest Port 1 View  08/17/2015  CLINICAL  DATA:  Shortness of breath EXAM: PORTABLE CHEST 1 VIEW COMPARISON:  01/22/2015 FINDINGS: Low volume chest with coarse opacities at the bases. Normal heart size and mediastinal contours for technique. IMPRESSION: Low volume chest with coarse basilar opacities similar to 2016. This could reflect recurrent pneumonia/aspiration or chronic process with fibrosis. Recommend continued surveillance. Electronically Signed   By: Marnee Spring M.D.   On: 08/17/2015 06:30    EKG:   Sinus tachycardia IMPRESSION AND PLAN:   Walter Wong  is a 30 y.o. male with a known history of Type 2 diabetes not on medication, bipolar disorder, schizophrenia, chronic kidney disease stage III, ADHD presents to the emergency room from group home with increasing shortness of breath getting worse over last few days along with fever of 102.2 when rectal temperature was taken in the emergency room. Patient's chest x-ray shows bilateral increased infiltrates consistent with either pneumonia and/or underlying congestive heart failure. He is BNP is 30. He has history of chronic diastolic congestive heart failure with echo done in November 2016 revealing EF of 60%  1. Sepsis secondary to pneumonia -Presented with fever of 102.2, elevated white count, tachycardia, chest x-ray with bilateral coarse infiltrates -Admit to telemetry -Follow blood cultures -Continue Rocephin and Zithromax -Follow up white count  2. Acute on chronic hypoxic respiratory failure secondary to pneumonia and underlying acute on chronic diastolic congestive heart failure-appears mild -BNP is 30. No leg edema. However patient seems like does have  some fluid buildup with congestion -Received Lasix 40 mg in the emergency room 1 -We'll resume oral Lasix once blood pressure is stable -Trickle IV fluids for maintenance -Nebulizer treatment -Pulmonary consultation  3. Acute on chronic kidney disease stage III -Baseline creatinine 1.6-1.9 -Creatinine today is  2.66 -Patient received 2 L of IV fluids. I will continue maintenance fluids at 75 an hour -Nephrology consultation -Avoid nephrotoxins -Dose meds renally adjusted  4. History of diabetes Sliding scale insulin  5. DVT prophylaxis subcutaneous heparin  6. History of bipolar disorder along with schizophrenia -Continue his psych meds    All the records are reviewed and case discussed with ED provider. Management plans discussed with the patient, family and they are in agreement.  CODE STATUS: Full TOTAL CRITICAL TIME TAKING CARE OF THIS PATIENT: 50 minutes.    Jase Reep M.D on 08/17/2015 at 11:29 AM  Between 7am to 6pm - Pager - [phone]  After 6pm go to [email or web address] - password [email or web address] [facility]  [facility]  Office  [phone]  CC: Primary care physician; Mickel FuchsWROTH, THOMAS H, MD

## 2015-08-17 NOTE — ED Notes (Signed)
Pt O2 sats varies 88% to 97% on 4L [place]. Resp 25-35. Pt is resting and breathing better. However ABG results low PAO2 and O2sat. SUNG MD made aware. Sung MD went to see that patient is improving but wants to try to BIPAP to see it helps. Dolores FrameSung made pt aware he has pneumonia and will be admitted to hospital.

## 2015-08-17 NOTE — ED Notes (Signed)
Patient presents to Emergency Department via EMS with complaints of  Anxiety cant breath started around 3am after drinking a lot of water and soda. Pt states has happen before and continues to drinking soda and water. Hx bipolar chf DM HTN CRD. Pt is noted to have shallow labored breathing.

## 2015-08-18 ENCOUNTER — Inpatient Hospital Stay: Payer: Medicare Other

## 2015-08-18 DIAGNOSIS — J849 Interstitial pulmonary disease, unspecified: Secondary | ICD-10-CM

## 2015-08-18 DIAGNOSIS — A419 Sepsis, unspecified organism: Secondary | ICD-10-CM | POA: Diagnosis not present

## 2015-08-18 LAB — BASIC METABOLIC PANEL
Anion gap: 5 (ref 5–15)
BUN: 22 mg/dL — AB (ref 6–20)
CHLORIDE: 104 mmol/L (ref 101–111)
CO2: 27 mmol/L (ref 22–32)
Calcium: 8 mg/dL — ABNORMAL LOW (ref 8.9–10.3)
Creatinine, Ser: 2.15 mg/dL — ABNORMAL HIGH (ref 0.61–1.24)
GFR calc Af Amer: 46 mL/min — ABNORMAL LOW (ref >60)
GFR calc non Af Amer: 40 mL/min — ABNORMAL LOW (ref >60)
GLUCOSE: 90 mg/dL (ref 65–99)
POTASSIUM: 3.6 mmol/L (ref 3.5–5.1)
Sodium: 136 mmol/L (ref 135–145)

## 2015-08-18 LAB — GLUCOSE, CAPILLARY
GLUCOSE-CAPILLARY: 87 mg/dL (ref 65–99)
GLUCOSE-CAPILLARY: 95 mg/dL (ref 65–99)
GLUCOSE-CAPILLARY: 130 mg/dL — AB (ref 65–99)
GLUCOSE-CAPILLARY: 98 mg/dL (ref 65–99)

## 2015-08-18 LAB — PROTEIN / CREATININE RATIO, URINE
CREATININE, URINE: 48 mg/dL
PROTEIN CREATININE RATIO: 2.92 mg/mg{creat} — AB (ref 0.00–0.15)
TOTAL PROTEIN, URINE: 140 mg/dL

## 2015-08-18 LAB — RAPID HIV SCREEN (HIV 1/2 AB+AG)
HIV 1/2 Antibodies: NONREACTIVE
HIV-1 P24 Antigen - HIV24: NONREACTIVE

## 2015-08-18 LAB — URINE CULTURE
Culture: NO GROWTH
Special Requests: NORMAL

## 2015-08-18 MED ORDER — LOSARTAN POTASSIUM 25 MG PO TABS
25.0000 mg | ORAL_TABLET | Freq: Every day | ORAL | Status: DC
  Administered 2015-08-18 – 2015-08-19 (×2): 25 mg via ORAL
  Filled 2015-08-18 (×2): qty 1

## 2015-08-18 MED ORDER — METHYLPREDNISOLONE SODIUM SUCC 40 MG IJ SOLR
40.0000 mg | Freq: Two times a day (BID) | INTRAMUSCULAR | Status: AC
  Administered 2015-08-18 – 2015-08-19 (×3): 40 mg via INTRAVENOUS
  Filled 2015-08-18 (×3): qty 1

## 2015-08-18 NOTE — Progress Notes (Signed)
Patients family at bedside and asking if RN will communicate to doctor that patient has been having joint pain, specifically to hands, elbows and legs. Will pass along in report. Family aware and reports satisfaction.

## 2015-08-18 NOTE — Care Management Important Message (Signed)
Important Message  Patient Details  Name: Milinda AntisJason A Swinson MRN: [id number] Date of Birth: 05/19/1985   Medicare Important Message Given:  Yes    Marily MemosLisa M Jatoria Kneeland, RN 08/18/2015, 2:35 PM

## 2015-08-18 NOTE — Progress Notes (Signed)
Slept at long intervals.  Remains dyspneic on exertion with nonproductive cough.
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
## 2015-08-18 NOTE — Progress Notes (Signed)
Subjective:  Patient known to our practice from outpatient.  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  He states he could not breathe right. Diagnostic workup is in progress but is inconclusive He is tachypneic and tachycardic  Objective:  Vital signs in last 24 hours:  Temp:  [97.5 F (36.4 C)-98.8 F (37.1 C)] 98.8 F (37.1 C) (06/07 1149) Pulse Rate:  [86-108] 103 (06/07 1149) Resp:  [22-24] 22 (06/07 1149) BP: (111-123)/(76-94) 111/76 mmHg (06/07 1149) SpO2:  [92 %-100 %] 98 % (06/07 1149) Weight:  [84.369 kg (186 lb)] 84.369 kg (186 lb) (06/06 1533)  Weight change: -14.062 kg (-31 lb) Filed Weights   08/17/15 0533 08/17/15 1533  Weight: 98.431 kg (217 lb) 84.369 kg (186 lb)    Intake/Output:    Intake/Output Summary (Last 24 hours) at 08/18/15 1444 Last data filed at 08/18/15 0911  Gross per 24 hour  Intake 1586.25 ml  Output   4801 ml  Net -3214.75 ml     Physical Exam: General: No acute distress, sitting up in bed  HEENT Anicteric, moist mucous membranes  Neck supple  Pulm/lungs tachypneic, clear to auscultation  CVS/Heart Tachycardic, regular, sinus tachycardia on telemetry  Abdomen:  Soft, nondistended, nontender  Extremities: No peripheral edema  Neurologic: Alert, oriented  Skin: No acute rashes          Basic Metabolic Panel:   Recent Labs Lab 08/17/15 0605 08/18/15 0406  NA 139 136  K 3.7 3.6  CL 103 104  CO2 26 27  GLUCOSE 116* 90  BUN 25* 22*  CREATININE 2.60* 2.15*  CALCIUM 8.2* 8.0*     CBC:  Recent Labs Lab 08/17/15 0605  WBC 13.4*  NEUTROABS 11.6*  HGB 11.8*  HCT 37.1*  MCV 68.3*  PLT 341      Microbiology:  Recent Results (from the past 720 hour(s))  Culture, blood (routine x 2)     Status: None (Preliminary result)   Collection Time: 08/17/15  6:46 AM   Result Value Ref Range Status   Specimen Description BLOOD LEFT FOREARM  Final   Special Requests   Final    BOTTLES DRAWN AEROBIC AND ANAEROBIC AER ANA   Culture NO GROWTH < 12 HOURS  Final   Report Status PENDING  Incomplete  Urine culture     Status: None   Collection Time: 08/17/15  6:46 AM  Result Value Ref Range Status   Specimen Description URINE, RANDOM  Final   Special Requests Normal  Final   Culture NO GROWTH Performed at [facility]   Final   Report Status 08/18/2015 FINAL  Final  Culture, blood (routine x 2)     Status: None (Preliminary result)   Collection Time: 08/17/15  6:48 AM  Result Value Ref Range Status   Specimen Description BLOOD RIGHT AC  Final   Special Requests BOTTLES DRAWN AEROBIC AND ANAEROBIC   Final   Culture NO GROWTH < 12 HOURS  Final   Report Status PENDING  Incomplete    Coagulation Studies: No results for input(s): LABPROT, INR in the last 72 hours.  Urinalysis:  Recent Labs  08/17/15 0646  COLORURINE STRAW*  LABSPEC 1.005  PHURINE 6.0  GLUCOSEU NEGATIVE  HGBUR 1+*  BILIRUBINUR NEGATIVE  KETONESUR NEGATIVE  PROTEINUR 100*  NITRITE NEGATIVE  LEUKOCYTESUR NEGATIVE  Imaging: Ct Chest Wo Contrast  08/18/2015  CLINICAL DATA:  MD notes: 30 year old male with past medical history significant for ADHD, diabetes, bipolar disorder, chronic kidney disease. Patient lives in a group home. Patient presented to ED with fever off 102.12F. Patient denies any chest pain. EXAM: CT CHEST WITHOUT CONTRAST TECHNIQUE: Multidetector CT imaging of the chest was performed following the standard protocol without IV contrast. COMPARISON:  Chest radiograph 08/17/2015 FINDINGS: Mediastinum/Nodes: Mild axillary adenopathy. RIGHT hilar axial lymph node measures 10 mm on image 5, series 2 Mild mediastinal adenopathy difficult to assess on this noncontrast exam. Subcarinal lymph node measures 20 mm. RIGHT paratracheal lymph node measures 10  mm. There is extensive peribronchial hilar thickening best seen on coronal imaging (Image 85, series 5. Lungs/Pleura: Bibasilar moderate to severe severe bronchiectasis with extensive peribronchial thickening the nodularity. Bronchiectasis in the RIGHT middle lobe and inferior lingula. Upper lobe bronchiectasis is also present to lesser degree. The groove interlobular peribronchial thickening is less in the upper lobes and lower lobes. There is diffuse ground-glass opacities throughout the lungs suggesting atelectasis. No discrete nodularity. Upper abdomen: Normal spleen on noncontrast exam. Normal limited view of the liver. Musculoskeletal: No aggressive osseous lesion. IMPRESSION: 1. SEVERE MULTILOBAR BRONCHIECTASIS with lower lobe predominance. 2. Extensive central perihilar peribronchial thickening and mild mediastinal lymphadenopathy. 3. Findings are consistent chronic pulmonary infectious / inflammatory process. Recommend evaluation for SARCOIDOSIS. Chronic aspiration or remote infection could have a similar pattern bronchiectasis. Electronically Signed   By: Genevive BiStewart  Edmunds M.D.   On: 08/18/2015 11:05   Dg Chest Port 1 View  08/17/2015  CLINICAL DATA:  Shortness of breath EXAM: PORTABLE CHEST 1 VIEW COMPARISON:  01/22/2015 FINDINGS: Low volume chest with coarse opacities at the bases. Normal heart size and mediastinal contours for technique. IMPRESSION: Low volume chest with coarse basilar opacities similar to 2016. This could reflect recurrent pneumonia/aspiration or chronic process with fibrosis. Recommend continued surveillance. Electronically Signed   By: Marnee SpringJonathon  Watts M.D.   On: 08/17/2015 06:30     Medications:     . azithromycin  500 mg Intravenous Q24H  . carbamazepine  200 mg Oral BID  . cefTRIAXone (ROCEPHIN)  IV  2 g Intravenous Q24H  . docusate sodium  100 mg Oral BID  . heparin  5,000 Units Subcutaneous Q8H  . insulin aspart  0-9 Units Subcutaneous TID WC  .  ipratropium-albuterol  3 mL Nebulization Q6H  . methylPREDNISolone (SOLU-MEDROL) injection  40 mg Intravenous Q12H  . QUEtiapine  100 mg Oral Daily  . QUEtiapine  600 mg Oral QHS  . thiothixene  10 mg Oral BID   acetaminophen **OR** acetaminophen, albuterol, bisacodyl, ondansetron **OR** ondansetron (ZOFRAN) IV  Assessment/ Plan:  30 y.o.African American male with history of bipolar disorder, ADHD, history of smoking,chronic kidney disease presents for shortness of breath  1. Chronic kidney disease stage III with acute renal failure-  Patient has biopsy-proven FSGS Recent worsening is likely due to volume shifts Our patient recent creatinine is 1.88 Today's creatinine is 2.15 it is close to his baseline  2. Proteinuria/FSGS We'll get him started on low-dose ACE inhibitor  3. New diagnoses of severe bronchiectasis and mediastinal lymphadenopathy Agree with pulmonary consult    LOS: 1 Jourden Delmont 6/7/20172:44 PM

## 2015-08-18 NOTE — Progress Notes (Signed)
[employer] - [place] [facility]   PATIENT NAME: Walter Wong    MR#:  [id number]  DATE OF BIRTH:  22-Apr-1985  SUBJECTIVE:   Patient is still somewhat tachypneic, tachycardic. Denies shortness of breath. Denies chest pain. No other acute complaints presently. CT scan showing bronchiectasis and lymphadenopathy possibly consistent with sarcoidosis.  REVIEW OF SYSTEMS:    Review of Systems  Constitutional: Negative for fever and chills.  HENT: Negative for congestion and tinnitus.   Eyes: Negative for blurred vision and double vision.  Respiratory: Negative for cough, shortness of breath and wheezing.   Cardiovascular: Negative for chest pain, orthopnea and PND.  Gastrointestinal: Negative for nausea, vomiting, abdominal pain and diarrhea.  Genitourinary: Negative for dysuria and hematuria.  Neurological: Negative for dizziness, sensory change and focal weakness.  All other systems reviewed and are negative.   Nutrition: Heart Healthy/Carb control.  Tolerating Diet: Yes Tolerating PT: Ambulatory   DRUG ALLERGIES:  No Known Allergies  VITALS:  Blood pressure 111/76, pulse 103, temperature 98.8 F (37.1 C), temperature source Oral, resp. rate 22, height  (1.676 m), weight 84.369 kg (186 lb), SpO2 98 %.  PHYSICAL EXAMINATION:   Physical Exam  GENERAL:  30 y.o.-year-old patient lying in the bed in mild resp. Distress.   EYES: Pupils equal, round, reactive to light and accommodation. No scleral icterus. Extraocular muscles intact.  HEENT: Head atraumatic, normocephalic. Oropharynx and nasopharynx clear.  NECK:  Supple, no jugular venous distention. No thyroid enlargement, no tenderness.  LUNGS: Normal breath sounds bilaterally, no wheezing, rales, rhonchi. + use of accessory muscles.  CARDIOVASCULAR: S1, S2 Tachycardic, No murmurs, rubs, or gallops.  ABDOMEN: Soft, nontender, nondistended. Bowel sounds present. No organomegaly or mass.  EXTREMITIES: No  cyanosis, clubbing or edema b/l.    NEUROLOGIC: Cranial nerves II through XII are intact. No focal Motor or sensory deficits b/l.   PSYCHIATRIC: The patient is alert and oriented x 3.  SKIN: No obvious rash, lesion, or ulcer.    LABORATORY PANEL:   CBC  Recent Labs Lab 08/17/15 0605  WBC 13.4*  HGB 11.8*  HCT 37.1*  PLT 341   ------------------------------------------------------------------------------------------------------------------  Chemistries   Recent Labs Lab 08/17/15 0605 08/18/15 0406  NA 139 136  K 3.7 3.6  CL 103 104  CO2 26 27  GLUCOSE 116* 90  BUN 25* 22*  CREATININE 2.60* 2.15*  CALCIUM 8.2* 8.0*  AST 29  --   ALT 11*  --   ALKPHOS 51  --   BILITOT 0.3  --    ------------------------------------------------------------------------------------------------------------------  Cardiac Enzymes  Recent Labs Lab 08/17/15 0605  TROPONINI 0.06*   ------------------------------------------------------------------------------------------------------------------  RADIOLOGY:  Ct Chest Wo Contrast  08/18/2015  CLINICAL DATA:  MD notes: 30 year old male with past medical history significant for ADHD, diabetes, bipolar disorder, chronic kidney disease. Patient lives in a group home. Patient presented to ED with fever off 102.30F. Patient denies any chest pain. EXAM: CT CHEST WITHOUT CONTRAST TECHNIQUE: Multidetector CT imaging of the chest was performed following the standard protocol without IV contrast. COMPARISON:  Chest radiograph 08/17/2015 FINDINGS: Mediastinum/Nodes: Mild axillary adenopathy. RIGHT hilar axial lymph node measures 10 mm on image 5, series 2 Mild mediastinal adenopathy difficult to assess on this noncontrast exam. Subcarinal lymph node measures 20 mm. RIGHT paratracheal lymph node measures 10 mm. There is extensive peribronchial hilar thickening best seen on coronal imaging (Image 85, series 5. Lungs/Pleura: Bibasilar moderate to severe severe  bronchiectasis with extensive peribronchial thickening  the nodularity. Bronchiectasis in the RIGHT middle lobe and inferior lingula. Upper lobe bronchiectasis is also present to lesser degree. The groove interlobular peribronchial thickening is less in the upper lobes and lower lobes. There is diffuse ground-glass opacities throughout the lungs suggesting atelectasis. No discrete nodularity. Upper abdomen: Normal spleen on noncontrast exam. Normal limited view of the liver. Musculoskeletal: No aggressive osseous lesion. IMPRESSION: 1. SEVERE MULTILOBAR BRONCHIECTASIS with lower lobe predominance. 2. Extensive central perihilar peribronchial thickening and mild mediastinal lymphadenopathy. 3. Findings are consistent chronic pulmonary infectious / inflammatory process. Recommend evaluation for SARCOIDOSIS. Chronic aspiration or remote infection could have a similar pattern bronchiectasis. Electronically Signed   By: Genevive BiStewart  Edmunds M.D.   On: 08/18/2015 11:05   Dg Chest Port 1 View  08/17/2015  CLINICAL DATA:  Shortness of breath EXAM: PORTABLE CHEST 1 VIEW COMPARISON:  01/22/2015 FINDINGS: Low volume chest with coarse opacities at the bases. Normal heart size and mediastinal contours for technique. IMPRESSION: Low volume chest with coarse basilar opacities similar to 2016. This could reflect recurrent pneumonia/aspiration or chronic process with fibrosis. Recommend continued surveillance. Electronically Signed   By: Marnee SpringJonathon  Watts M.D.   On: 08/17/2015 06:30     ASSESSMENT AND PLAN:   30 year old male with past medical history of ADHD, previous smoker, diabetes, chronic kidney disease stage III, who presents to the hospital due to shortness of breath.  1. Sepsis secondary to pneumonia -Presented with fever of 102.2, elevated white count, tachycardia, chest x-ray with bilateral coarse infiltrates - afebrile overnight.  Cont. Rocephin, Zithromax.  Hemodynamically stable.   2. Acute on chronic hypoxic  respiratory failure secondary to pneumonia/Chronic lung disease.  - unlikely this is CHF. CT chest w/out contrast showing Bronchiectasis/mediastinal lymphadenopathy.  ?? Sarcoidosis.  - started on IV steroids and will await further Pulmonary Input.  Cont. Empiric abx as mentioned above.   3. Acute on chronic kidney disease stage III -Baseline creatinine 1.6-1.9. Cr. 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 Thedore MinsSingh.  - follow BUN/Cr.  Renal dose meds and avoid nephrotoxins.   4. History of diabetes - BS stable.  Cont. SSI.  - follow now w/ IV steroids.   6. History of bipolar disorder along with schizophrenia - cont. Seroquel, Tegretol, Thiothixene.   - will get Psych consult.  Discussed w/ Dr. Toni Amendlapacs and he will see pt. Tomorrow.  - for anxiety can use PRN ativan    All the records are reviewed and case discussed with Care Management/Social Workerr. Management plans discussed with the patient, family and they are in agreement.  CODE STATUS: Full  DVT Prophylaxis: Heparin SQ  TOTAL TIME TAKING CARE OF THIS PATIENT: 30 minutes.   POSSIBLE D/C IN 1-2 DAYS, DEPENDING ON CLINICAL CONDITION.   Houston SirenSAINANI,Seichi Kaufhold J M.D on 08/18/2015 at 1:06 PM  Between 7am to 6pm - Pager - [phone]  After 6pm go to [email or web address] - password [email or web address] [facility]  [facility]  Office  [phone]  CC: Primary care physician; Mickel FuchsWROTH, THOMAS H, MD

## 2015-08-18 NOTE — Progress Notes (Addendum)
[facility]* [facility]     Assessment and Plan:  Chronic interstitial lung disease. -Review of CT images of the chest shows bibasilar interstitial lung changes with traction bronchiectasis. Interstitial changes were seen on previous chest x-ray on 01/22/15. There is also hilar, paratracheal lymphadenopathy.  -Will need further workup of the patient's interstitial lung disease, including workup for possible sarcoidosis, Wegener's. Given his history of developmental disability, we will work this up conservatively with serologic testing first and consider further invasive testing afterwards, possibly on an outpatient basis.  Acute respiratory failure, due to volume overload. -Volume overload, related to acute on chronic renal failure. -Wean down oxygen as tolerated. -Continue antibiotics. -Wean down steroids, may need outpatient oxygen.  Bipolar disorder. -With schizophrenia, developmental disorder. Given these difficulties, discussion with the patient's mother. We will try to treat conservatively, and consider further interventional procedures outpatient if needed. He will likely require general anesthesia with bronchoscopy.  Chronic renal failure.  -Combination of ILD and renal disease, ?immunological disorder.   Date: 08/18/2015  MRN# [id number] Walter AntisJason A Gatling 10/14/1985   Walter Wong is a 30 y.o. old male seen in follow up for chief complaint of  Chief Complaint  Patient presents with  . Panic Attack     HPI:   Patient has no particular complaints at this time, he feels that he is doing better than when he first came in the hospital. I did discuss the patient's condition with his mother by telephone. She tells me that his dyspnea has been chronically progressive over the last several months, she does not notice any problems with dysphagia or choking while eating. She does note that he is been having a chronic cough with dyspnea, and was therefore taken at a group home a  few months ago because of these issues. He has a history of mental retardation and/or autism. She tells me that he can be very impulsive, and he became fairly distressed at the thought of doing any potential procedure.   Allergies:  Review of patient's allergies indicates no known allergies.  Review of Systems: Gen:  Denies  fever, sweats. HEENT: Denies blurred vision. Cvc:  No dizziness, chest pain or heaviness Resp:   Denies cough or sputum porduction. Gi: Denies swallowing difficulty, stomach pain.  Gu:  Denies bladder incontinence, burning urine Ext:   No Joint pain, stiffness. Skin: No skin rash, easy bruising. Endoc:  No polyuria, polydipsia. Psych: No depression, insomnia. Other:  All other systems were reviewed and found to be negative other than what is mentioned in the HPI.   Physical Examination:   VS: BP 111/76 mmHg  Pulse 103  Temp(Src) 98.8 F (37.1 C) (Oral)  Resp 22  Ht 5\' 6"  (1.676 m)  Wt 186 lb (84.369 kg)  BMI 30.04 kg/m2  SpO2 98%  General Appearance: No distress  Neuro:without focal findings,  speech normal,  HEENT: PERRLA, EOM intact. Pulmonary: normal breath sounds, No wheezing.   CardiovascularNormal S1,S2.  No m/r/g.   Abdomen: Benign, Soft, non-tender. Renal:  No costovertebral tenderness  GU:  Not performed at this time. Endoc: No evident thyromegaly, no signs of acromegaly. Skin:   warm, no rash. Extremities: normal, no cyanosis, clubbing.   LABORATORY PANEL:   CBC  Recent Labs Lab 08/17/15 0605  WBC 13.4*  HGB 11.8*  HCT 37.1*  PLT 341   ------------------------------------------------------------------------------------------------------------------  Chemistries   Recent Labs Lab 08/17/15 0605 08/18/15 0406  NA 139 136  K 3.7 3.6  CL  103 104  CO2 26 27  GLUCOSE 116* 90  BUN 25* 22*  CREATININE 2.60* 2.15*  CALCIUM 8.2* 8.0*  AST 29  --   ALT 11*  --   ALKPHOS 51  --   BILITOT 0.3  --     ------------------------------------------------------------------------------------------------------------------  Cardiac Enzymes  Recent Labs Lab 08/17/15 0605  TROPONINI 0.06*   ------------------------------------------------------------  RADIOLOGY:   No results found for this or any previous visit. Results for orders placed during the hospital encounter of 01/22/15  DG Chest 2 View   Narrative CLINICAL DATA:  Shortness of breath with exertion  EXAM: CHEST  2 VIEW  COMPARISON:  Jul 17, 2010  FINDINGS: Degree of inspiration is shallow. There is bibasilar interstitial edema. Heart is upper normal in size with mild pulmonary venous hypertension. No adenopathy. No bone lesions. No pneumothorax.  IMPRESSION: Findings indicative of a degree of congestive heart failure. No airspace consolidation.   Electronically Signed   By: Bretta Bang III M.D.   On: 01/22/2015 11:53    ------------------------------------------------------------------------------------------------------------------  Thank  you for allowing [facility] to assist in the care of your patient. Our recommendations are noted above.  Please contact us if we can be of further service.   Wells Guiles, MD.  [facility] Office Number: [phone]  Santiago Glad, M.D.  Stephanie Acre, M.D.  Billy Fischer, M.D  08/18/2015

## 2015-08-18 NOTE — Evaluation (Signed)
Physical Therapy Evaluation Patient Details Name: Walter Wong MRN: [id number] DOB: 01/16/1986 Today's Date: 08/18/2015   History of Present Illness  Pt is a 30 y/o male that presents with increased shortness of breath, found to have pneumonia. He has a history of CKD, bipolar disorder and shizophrenia.   Clinical Impression  Patient admitted with pneumonia, possible sepsis. He has psychiatric disorders in his previous medical history, and lives in a group home. He has been independent with mobility prior to this, and is relatively independent throughout mobility session today. He does have markedly increased respiratory rate throughout gait, unable to accurately measure O2 sats as his fingernails are too long. He denies shortness of breath with ambulation and supplemental O2. No loss of balance noted. No PT recommendations aside from pulmonary rehab if appropriate. Will be maintained on acute care caseload to monitor pulmonary status.     Follow Up Recommendations No PT follow up (Pulmonary rehab would be more appropriate if able)    Equipment Recommendations       Recommendations for Other Services       Precautions / Restrictions Restrictions Weight Bearing Restrictions: No      Mobility  Bed Mobility Overal bed mobility: Independent             General bed mobility comments: No deficits with bed mobility.   Transfers Overall transfer level: Independent Equipment used: None             General transfer comment: No deficits in speed or balance during transfers.   Ambulation/Gait Ambulation/Gait assistance: Independent Ambulation Distance (Feet): 200 Feet Assistive device: None Gait Pattern/deviations: Decreased step length - right;Decreased step length - left   Gait velocity interpretation: Below normal speed for age/gender General Gait Details: Patient with notable increase in respiratory rate, decreased O2 sats (84% on pulse ox, though likely inaccurate due to  extremely long finger nails) during ambulation. He does not report being short of breath. Cued multiple times to take deeper breaths, become less anxious which temporarily reduced RR, though he reverted back to baseline quickly.   Stairs            Wheelchair Mobility    Modified Rankin (Stroke Patients Only)       Balance Overall balance assessment: Independent                                           Pertinent Vitals/Pain Pain Assessment: No/denies pain    Home Living Family/patient expects to be discharged to:: Group home                      Prior Function Level of Independence: Independent         Comments: Reports no falls or other difficulties with ambulation.      Hand Dominance        Extremity/Trunk Assessment   Upper Extremity Assessment: Overall WFL for tasks assessed           Lower Extremity Assessment: Overall WFL for tasks assessed         Communication   Communication: No difficulties  Cognition Arousal/Alertness: Awake/alert Behavior During Therapy: WFL for tasks assessed/performed Overall Cognitive Status: History of cognitive impairments - at baseline                      General Comments  Exercises        Assessment/Plan    PT Assessment Patient needs continued PT services  PT Diagnosis Difficulty walking   PT Problem List Cardiopulmonary status limiting activity  PT Treatment Interventions Gait training;Therapeutic activities;Therapeutic exercise   PT Goals (Current goals can be found in the Care Plan section) Acute Rehab PT Goals Patient Stated Goal: To return home  PT Goal Formulation: With patient Time For Goal Achievement: 09/01/15 Potential to Achieve Goals: Good    Frequency Min 2X/week   Barriers to discharge        Co-evaluation               End of Session Equipment Utilized During Treatment: Gait belt;Oxygen Activity Tolerance: Patient tolerated  treatment well Patient left: in bed;with call bell/phone within reach;with bed alarm set Nurse Communication: Mobility status         Time: [id number] PT Time Calculation (min) (ACUTE ONLY): 13 min   Charges:   PT Evaluation $PT Eval Low Complexity: 1 Procedure     PT G Codes:       Kerin RansomPatrick A Oreta Soloway, PT, DPT    08/18/2015, 5:15 PM

## 2015-08-19 DIAGNOSIS — F250 Schizoaffective disorder, bipolar type: Secondary | ICD-10-CM

## 2015-08-19 DIAGNOSIS — F259 Schizoaffective disorder, unspecified: Secondary | ICD-10-CM

## 2015-08-19 LAB — BASIC METABOLIC PANEL
Anion gap: 6 (ref 5–15)
BUN: 21 mg/dL — ABNORMAL HIGH (ref 6–20)
CO2: 26 mmol/L (ref 22–32)
Calcium: 8.8 mg/dL — ABNORMAL LOW (ref 8.9–10.3)
Chloride: 104 mmol/L (ref 101–111)
Creatinine, Ser: 1.94 mg/dL — ABNORMAL HIGH (ref 0.61–1.24)
GFR calc Af Amer: 52 mL/min — ABNORMAL LOW (ref >60)
GFR calc non Af Amer: 45 mL/min — ABNORMAL LOW (ref >60)
Glucose, Bld: 120 mg/dL — ABNORMAL HIGH (ref 65–99)
Potassium: 4.3 mmol/L (ref 3.5–5.1)
Sodium: 136 mmol/L (ref 135–145)

## 2015-08-19 LAB — GLUCOSE, CAPILLARY
GLUCOSE-CAPILLARY: 82 mg/dL (ref 65–99)
Glucose-Capillary: 101 mg/dL — ABNORMAL HIGH (ref 65–99)
Glucose-Capillary: 72 mg/dL (ref 65–99)
Glucose-Capillary: 134 mg/dL — ABNORMAL HIGH (ref 65–99)

## 2015-08-19 MED ORDER — CEFUROXIME AXETIL 500 MG PO TABS
500.0000 mg | ORAL_TABLET | Freq: Two times a day (BID) | ORAL | Status: DC
  Administered 2015-08-20: 500 mg via ORAL
  Filled 2015-08-19 (×2): qty 1

## 2015-08-19 MED ORDER — AZITHROMYCIN 250 MG PO TABS
250.0000 mg | ORAL_TABLET | Freq: Every day | ORAL | Status: DC
  Administered 2015-08-20: 250 mg via ORAL
  Filled 2015-08-19: qty 1

## 2015-08-19 MED ORDER — PREDNISONE 50 MG PO TABS
60.0000 mg | ORAL_TABLET | Freq: Every day | ORAL | Status: DC
  Administered 2015-08-20: 60 mg via ORAL
  Filled 2015-08-19: qty 1

## 2015-08-19 NOTE — Care Management (Signed)
Requested primary nurse get qualifying O2 SATS. It is anticipated patient will discharge home tomorrow.

## 2015-08-19 NOTE — Progress Notes (Signed)
Subjective:  Patient known to our practice from outpatient.  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  He states he could not breathe right. Diagnostic workup so far has included cT scan which showed bronchiectasis and interstitial lung disease Pulmonary team is following  Objective:  Vital signs in last 24 hours:  Temp:  [97.6 F (36.4 C)-98.1 F (36.7 C)] 98.1 F (36.7 C) (06/08 0819) Pulse Rate:  [48-92] 48 (06/08 1155) Resp:  [18-24] 20 (06/08 1155) BP: (91-122)/(62-94) 122/94 mmHg (06/08 1155) SpO2:  [85 %-100 %] 85 % (06/08 1505)  Weight change:  Filed Weights   08/17/15 0533 08/17/15 1533  Weight: 98.431 kg (217 lb) 84.369 kg (186 lb)    Intake/Output:    Intake/Output Summary (Last 24 hours) at 08/19/15 1552 Last data filed at 08/19/15 1351  Gross per 24 hour  Intake    360 ml  Output   4075 ml  Net  -3715 ml     Physical Exam: General: No acute distress, laying in bed  HEENT Anicteric, moist mucous membranes  Neck supple  Pulm/lungs normal effort, clear to auscultation  CVS/Heart Regular rate and rhythm  Abdomen:  Soft, nondistended, nontender  Extremities: No peripheral edema  Neurologic: Alert, oriented  Skin: No acute rashes          Basic Metabolic Panel:   Recent Labs Lab 08/17/15 0605 08/18/15 0406 08/19/15 0348  NA 139 136 136  K 3.7 3.6 4.3  CL 103 104 104  CO2 GLUCOSE 116* 90 120*  BUN 25* 22* 21*  CREATININE 2.60* 2.15* 1.94*  CALCIUM 8.2* 8.0* 8.8*     CBC:  Recent Labs Lab 08/17/15 0605  WBC 13.4*  NEUTROABS 11.6*  HGB 11.8*  HCT 37.1*  MCV 68.3*  PLT 341      Microbiology:  Recent Results (from the past 720 hour(s))  Culture, blood (routine x 2)     Status: None (Preliminary result)   Collection Time: 08/17/15  6:46 AM  Result Value Ref Range  Status   Specimen Description BLOOD LEFT FOREARM  Final   Special Requests   Final    BOTTLES DRAWN AEROBIC AND ANAEROBIC AER ANA   Culture NO GROWTH 2 DAYS  Final   Report Status PENDING  Incomplete  Urine culture     Status: None   Collection Time: 08/17/15  6:46 AM  Result Value Ref Range Status   Specimen Description URINE, RANDOM  Final   Special Requests Normal  Final   Culture NO GROWTH Performed at [facility]   Final   Report Status 08/18/2015 FINAL  Final  Culture, blood (routine x 2)     Status: None (Preliminary result)   Collection Time: 08/17/15  6:48 AM  Result Value Ref Range Status   Specimen Description BLOOD RIGHT AC  Final   Special Requests BOTTLES DRAWN AEROBIC AND ANAEROBIC   Final   Culture NO GROWTH 2 DAYS  Final   Report Status PENDING  Incomplete    Coagulation Studies: No results for input(s): LABPROT, INR in the last 72 hours.  Urinalysis:  Recent Labs  08/17/15 0646  COLORURINE STRAW*  LABSPEC 1.005  PHURINE 6.0  GLUCOSEU NEGATIVE  HGBUR 1+*  BILIRUBINUR NEGATIVE  KETONESUR NEGATIVE  PROTEINUR 100*  NITRITE NEGATIVE  LEUKOCYTESUR  NEGATIVE      Imaging: Ct Chest Wo Contrast  08/18/2015  CLINICAL DATA:  MD notes: 30 year old male with past medical history significant for ADHD, diabetes, bipolar disorder, chronic kidney disease. Patient lives in a group home. Patient presented to ED with fever off 102.41F. Patient denies any chest pain. EXAM: CT CHEST WITHOUT CONTRAST TECHNIQUE: Multidetector CT imaging of the chest was performed following the standard protocol without IV contrast. COMPARISON:  Chest radiograph 08/17/2015 FINDINGS: Mediastinum/Nodes: Mild axillary adenopathy. RIGHT hilar axial lymph node measures 10 mm on image 5, series 2 Mild mediastinal adenopathy difficult to assess on this noncontrast exam. Subcarinal lymph node measures 20 mm. RIGHT paratracheal lymph node measures 10 mm. There is extensive  peribronchial hilar thickening best seen on coronal imaging (Image 85, series 5. Lungs/Pleura: Bibasilar moderate to severe severe bronchiectasis with extensive peribronchial thickening the nodularity. Bronchiectasis in the RIGHT middle lobe and inferior lingula. Upper lobe bronchiectasis is also present to lesser degree. The groove interlobular peribronchial thickening is less in the upper lobes and lower lobes. There is diffuse ground-glass opacities throughout the lungs suggesting atelectasis. No discrete nodularity. Upper abdomen: Normal spleen on noncontrast exam. Normal limited view of the liver. Musculoskeletal: No aggressive osseous lesion. IMPRESSION: 1. SEVERE MULTILOBAR BRONCHIECTASIS with lower lobe predominance. 2. Extensive central perihilar peribronchial thickening and mild mediastinal lymphadenopathy. 3. Findings are consistent chronic pulmonary infectious / inflammatory process. Recommend evaluation for SARCOIDOSIS. Chronic aspiration or remote infection could have a similar pattern bronchiectasis. Electronically Signed   By: Genevive BiStewart  Edmunds M.D.   On: 08/18/2015 11:05     Medications:     . [START ON 08/20/2015] azithromycin  250 mg Oral Daily  . carbamazepine  200 mg Oral BID  . [START ON 08/20/2015] cefUROXime  500 mg Oral BID WC  . docusate sodium  100 mg Oral BID  . heparin  5,000 Units Subcutaneous Q8H  . insulin aspart  0-9 Units Subcutaneous TID WC  . ipratropium-albuterol  3 mL Nebulization Q6H  . losartan  25 mg Oral QHS  . [START ON 08/20/2015] predniSONE  60 mg Oral Q breakfast  . QUEtiapine  100 mg Oral Daily  . QUEtiapine  600 mg Oral QHS  . thiothixene  10 mg Oral BID   acetaminophen **OR** acetaminophen, albuterol, bisacodyl, ondansetron **OR** ondansetron (ZOFRAN) IV  Assessment/ Plan:  30 y.o.African American male with history of bipolar disorder, ADHD, history of smoking,chronic kidney disease presents for shortness of breath  1. Chronic kidney disease stage  III with acute renal failure-  Patient has biopsy-proven FSGS Recent worsening is likely due to volume shifts Out patient recent creatinine is 1.88 Today's creatinine is 1.94 which is close to his baseline  2. Proteinuria/FSGS Continue low-dose ARB  3. New diagnoses of severe bronchiectasis and mediastinal lymphadenopathy Pulmonary evaluation in progress Patient was started on prednisone    LOS: 2 Genesi Stefanko 6/8/20173:52 PM

## 2015-08-19 NOTE — Progress Notes (Signed)
[facility] [place] [facility]   PATIENT NAME: Walter GoldmannJason Wong    MR#:  [id number]  DATE OF BIRTH:  06/13/1985  SUBJECTIVE:   Patient resting quietly Denies shortness of breath. Denies chest pain. No other acute complaints presently. CT scan showing bronchiectasis and lymphadenopathy possibly consistent with sarcoidosis.  REVIEW OF SYSTEMS:    Review of Systems  Constitutional: Negative for fever and chills.  HENT: Negative for congestion and tinnitus.   Eyes: Negative for blurred vision and double vision.  Respiratory: Negative for cough, shortness of breath and wheezing.   Cardiovascular: Negative for chest pain, orthopnea and PND.  Gastrointestinal: Negative for nausea, vomiting, abdominal pain and diarrhea.  Genitourinary: Negative for dysuria and hematuria.  Neurological: Negative for dizziness, sensory change and focal weakness.  All other systems reviewed and are negative.   Nutrition: Heart Healthy/Carb control.  Tolerating Diet: Yes Tolerating PT: Ambulatory   DRUG ALLERGIES:  No Known Allergies  VITALS:  Blood pressure 122/94, pulse 48, temperature 98.1 F (36.7 C), temperature source Oral, resp. rate 20, height 5\' 6"  (1.676 m), weight 84.369 kg (186 lb), SpO2 92 %.  PHYSICAL EXAMINATION:   Physical Exam  GENERAL:  30 y.o.-year-old patient lying in the bed in mild resp. Distress.   EYES: Pupils equal, round, reactive to light and accommodation. No scleral icterus. Extraocular muscles intact.  HEENT: Head atraumatic, normocephalic. Oropharynx and nasopharynx clear.  NECK:  Supple, no jugular venous distention. No thyroid enlargement, no tenderness.  LUNGS: Normal breath sounds bilaterally, no wheezing, rales, rhonchi. + use of accessory muscles.  CARDIOVASCULAR: S1, S2 Tachycardic, No murmurs, rubs, or gallops.  ABDOMEN: Soft, nontender, nondistended. Bowel sounds present. No organomegaly or mass.  EXTREMITIES: No cyanosis, clubbing or edema  b/l.    NEUROLOGIC: Cranial nerves II through XII are intact. No focal Motor or sensory deficits b/l.   PSYCHIATRIC: patient is alert and oriented x 3.  SKIN: No obvious rash, lesion, or ulcer.    LABORATORY PANEL:   CBC  Recent Labs Lab 08/17/15 0605  WBC 13.4*  HGB 11.8*  HCT 37.1*  PLT 341   ------------------------------------------------------------------------------------------------------------------  Chemistries   Recent Labs Lab 08/17/15 0605  08/19/15 0348  NA 139  < > 136  K 3.7  < > 4.3  CL 103  < > 104  CO2 26  < > 26  GLUCOSE 116*  < > 120*  BUN 25*  < > 21*  CREATININE 2.60*  < > 1.94*  CALCIUM 8.2*  < > 8.8*  AST 29  --   --   ALT 11*  --   --   ALKPHOS 51  --   --   BILITOT 0.3  --   --   < > = values in this interval not displayed. ------------------------------------------------------------------------------------------------------------------  Cardiac Enzymes  Recent Labs Lab 08/17/15 0605  TROPONINI 0.06*   ------------------------------------------------------------------------------------------------------------------  RADIOLOGY:  Ct Chest Wo Contrast  08/18/2015  CLINICAL DATA:  MD notes: 30 year old male with past medical history significant for ADHD, diabetes, bipolar disorder, chronic kidney disease. Patient lives in a group home. Patient presented to ED with fever off 102.38F. Patient denies any chest pain. EXAM: CT CHEST WITHOUT CONTRAST TECHNIQUE: Multidetector CT imaging of the chest was performed following the standard protocol without IV contrast. COMPARISON:  Chest radiograph 08/17/2015 FINDINGS: Mediastinum/Nodes: Mild axillary adenopathy. RIGHT hilar axial lymph node measures 10 mm on image 5, series 2 Mild mediastinal adenopathy difficult to assess on this noncontrast  exam. Subcarinal lymph node measures 20 mm. RIGHT paratracheal lymph node measures 10 mm. There is extensive peribronchial hilar thickening best seen on coronal  imaging (Image 85, series 5. Lungs/Pleura: Bibasilar moderate to severe severe bronchiectasis with extensive peribronchial thickening the nodularity. Bronchiectasis in the RIGHT middle lobe and inferior lingula. Upper lobe bronchiectasis is also present to lesser degree. The groove interlobular peribronchial thickening is less in the upper lobes and lower lobes. There is diffuse ground-glass opacities throughout the lungs suggesting atelectasis. No discrete nodularity. Upper abdomen: Normal spleen on noncontrast exam. Normal limited view of the liver. Musculoskeletal: No aggressive osseous lesion. IMPRESSION: 1. SEVERE MULTILOBAR BRONCHIECTASIS with lower lobe predominance. 2. Extensive central perihilar peribronchial thickening and mild mediastinal lymphadenopathy. 3. Findings are consistent chronic pulmonary infectious / inflammatory process. Recommend evaluation for SARCOIDOSIS. Chronic aspiration or remote infection could have a similar pattern bronchiectasis. Electronically Signed   By: Genevive Bi M.D.   On: 08/18/2015 11:05     ASSESSMENT AND PLAN:   30 year old male with past medical history of ADHD, previous smoker, diabetes, chronic kidney disease stage III, who presents to the hospital due to shortness of breath.  1. Sepsis secondary to pneumonia -Presented with fever of 102.2, elevated white count, tachycardia, chest x-ray with bilateral coarse infiltrates - afebrile overnight.  Cont. Rocephin, Zithromax.  Hemodynamically stable.  -change to oral abxs tomorrow  2. Acute on chronic hypoxic respiratory failure secondary to pneumonia/Chronic lung disease.  - CT chest w/out contrast showing Bronchiectasis/mediastinal lymphadenopathy.  ?? Sarcoidosis.  - started on IV steroids and appreciate Pulmonary Input.  Cont. Empiric abx as mentioned above.  -assess for home oxygen use  3. Acute on chronic kidney disease stage III -Baseline creatinine 1.6-1.9. Cr. 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 Thedore Mins.  -  Renal dose meds and avoid nephrotoxins.   4. History of diabetes - BS stable.  Cont. SSI.  - follow now w/ IV steroids.   6. History of bipolar disorder along with schizophrenia - cont. Seroquel, Tegretol, Thiothixene.   - will get Psych consult.  Discussed w/ Dr. Toni Amend and he will see pt.  - for anxiety can use PRN ativan   D/w pulmonary. If continues to improve than d/c to home tomorrow.   All the records are reviewed and case discussed with Care Management/Social Workerr. Management plans discussed with the patient, family and they are in agreement.  CODE STATUS: Full  DVT Prophylaxis: Heparin SQ  TOTAL TIME TAKING CARE OF THIS PATIENT: 30 minutes.   POSSIBLE D/C IN 1-2 DAYS, DEPENDING ON CLINICAL CONDITION.   Alayasia Breeding M.D on 08/19/2015 at 1:28 PM  Between 7am to 6pm - Pager - [phone]  After 6pm go to [email or web address] - password [email or web address] [facility]  [place] [facility]  Office  [phone]  CC: Primary care physician; Mickel Fuchs, MD

## 2015-08-19 NOTE — Consult Note (Signed)
[facility] Face-to-Face Psychiatry Consult   Reason for Consult:  Consult for this 30 year old man well known to me from the outpatient clinic. Patient has a history of schizoaffective disorder and borderline intellectual impairment. Currently in the hospital for worsening CHF and sepsis Referring Physician:  Posey Pronto Patient Identification: Walter Wong MRN:  [id number] Principal Diagnosis: Schizoaffective disorder [facility]) Diagnosis:   Patient Active Problem List   Diagnosis Date Noted  . Schizoaffective disorder ([place]) [F25.9] 08/19/2015  . Sepsis ([place]) [A41.9] 08/17/2015  . Acute on chronic diastolic heart failure ([place]) [I50.33] 02/19/2015  . CHF (congestive heart failure) ([place]) [I50.9] 01/22/2015  . Heart failure ([place]) [I50.9] 01/22/2015  . Bipolar 1 disorder, mixed, moderate ([place]) [F31.62] 11/12/2014  . Bipolar disorder, current episode mixed, moderate ([place]) [F31.62] 11/12/2014    Total Time spent with patient: 45 minutes  Subjective:   Walter Wong is a 30 y.o. male patient admitted with "I'm doing all right".  HPI:  Patient has a history of schizoaffective disorder. He is currently in the hospital with an acute episode of respiratory failure which appears to be a combination of infection and CHF. Emotionally he says he is doing fine. Denies feeling either angry or anxious or depressed. Denies any hallucinations. Says he was doing well at home until he started to get sick.  Social history: Patient lives at home with his mother and brother. He had lived for a while in a group home and was doing well but decided he didn't like it. I'm a little bit wary of how things are going to go back home but he says that they've been doing fine. He certainly much better on his medicine.  Substance abuse history: Patient does not have a known history of substance abuse problems.  Medical history: Congestive heart failure. Currently with respiratory failure and apparent infection.  Past Psychiatric  History: Patient has not had psychiatric hospitalizations. He has had some aggression in the past when psychosis was worse. Without his medicines he gets paranoid psychotic and labile. He is doing well on his combination of Tegretol Seroquel and Navane.  Risk to Self: Is patient at risk for suicide?: No Risk to Others:   Prior Inpatient Therapy:   Prior Outpatient Therapy:    Past Medical History:  Past Medical History  Diagnosis Date  . ADHD (attention deficit hyperactivity disorder)   . Diabetes mellitus, type II ([place])   . Bipolar disorder ([place])   . Schizophrenia ([place])   . Chronic kidney disease     Past Surgical History  Procedure Laterality Date  . Dental surgery     Family History:  Family History  Problem Relation Age of Onset  . Kidney failure Mother   . Congestive Heart Failure Mother    Family Psychiatric  History: None  Social History:  History  Alcohol Use No     History  Drug Use No    Social History   Social History  . Marital Status: Single    Spouse Name: N/A  . Number of Children: N/A  . Years of Education: N/A   Social History Main Topics  . Smoking status: Never Smoker   . Smokeless tobacco: Never Used  . Alcohol Use: No  . Drug Use: No  . Sexual Activity: No   Other Topics Concern  . None   Social History Narrative   Additional Social History:    Allergies:  No Known Allergies  Labs:  Results for orders placed or performed during the hospital  encounter of 08/17/15 (from the past 48 hour(s))  Glucose, capillary     Status: Abnormal   Collection Time: 08/17/15 10:18 PM  Result Value Ref Range   Glucose-Capillary 115 (H) 65 - 99 mg/dL  Basic metabolic panel     Status: Abnormal   Collection Time: 08/18/15  4:06 AM  Result Value Ref Range   Sodium 136 135 - 145 mmol/L   Potassium 3.6 3.5 - 5.1 mmol/L   Chloride 104 101 - 111 mmol/L   CO2 27 22 - 32 mmol/L   Glucose, Bld 90 65 - 99 mg/dL   BUN 22 (H) 6 - 20 mg/dL   Creatinine, Ser  2.15 (H) 0.61 - 1.24 mg/dL   Calcium 8.0 (L) 8.9 - 10.3 mg/dL   GFR calc non Af Amer 40 (L) >60 mL/min   GFR calc Af Amer 46 (L) >60 mL/min    Comment: (NOTE) The eGFR has been calculated using the CKD EPI equation. This calculation has not been validated in all clinical situations. eGFR's persistently <60 mL/min signify possible Chronic Kidney Disease.    Anion gap 5 5 - 15  Rapid HIV screen (HIV 1/2 Ab+Ag)     Status: None   Collection Time: 08/18/15  4:06 AM  Result Value Ref Range   HIV-1 P24 Antigen - HIV24 NON REACTIVE NON REACTIVE   HIV 1/2 Antibodies NON REACTIVE NON REACTIVE   Interpretation (HIV Ag Ab)      A non reactive test result means that HIV 1 or HIV 2 antibodies and HIV 1 p24 antigen were not detected in the specimen.  Glucose, capillary     Status: None   Collection Time: 08/18/15  7:54 AM  Result Value Ref Range   Glucose-Capillary 87 65 - 99 mg/dL  Glucose, capillary     Status: None   Collection Time: 08/18/15 11:52 AM  Result Value Ref Range   Glucose-Capillary 95 65 - 99 mg/dL  Glucose, capillary     Status: Abnormal   Collection Time: 08/18/15  4:32 PM  Result Value Ref Range   Glucose-Capillary 130 (H) 65 - 99 mg/dL   Comment 1 Notify RN   Glucose, capillary     Status: None   Collection Time: 08/18/15  9:08 PM  Result Value Ref Range   Glucose-Capillary 98 65 - 99 mg/dL  Basic metabolic panel     Status: Abnormal   Collection Time: 08/19/15  3:48 AM  Result Value Ref Range   Sodium 136 135 - 145 mmol/L   Potassium 4.3 3.5 - 5.1 mmol/L   Chloride 104 101 - 111 mmol/L   CO2 26 22 - 32 mmol/L   Glucose, Bld 120 (H) 65 - 99 mg/dL   BUN 21 (H) 6 - 20 mg/dL   Creatinine, Ser 1.94 (H) 0.61 - 1.24 mg/dL   Calcium 8.8 (L) 8.9 - 10.3 mg/dL   GFR calc non Af Amer 45 (L) >60 mL/min   GFR calc Af Amer 52 (L) >60 mL/min    Comment: (NOTE) The eGFR has been calculated using the CKD EPI equation. This calculation has not been validated in all clinical  situations. eGFR's persistently <60 mL/min signify possible Chronic Kidney Disease.    Anion gap 6 5 - 15  Glucose, capillary     Status: Abnormal   Collection Time: 08/19/15  7:45 AM  Result Value Ref Range   Glucose-Capillary 101 (H) 65 - 99 mg/dL   Comment 1 Notify RN  Glucose, capillary     Status: None   Collection Time: 08/19/15 11:54 AM  Result Value Ref Range   Glucose-Capillary 72 65 - 99 mg/dL   Comment 1 Notify RN   Glucose, capillary     Status: Abnormal   Collection Time: 08/19/15  4:38 PM  Result Value Ref Range   Glucose-Capillary 134 (H) 65 - 99 mg/dL    Current Facility-Administered Medications  Medication Dose Route Frequency Provider Last Rate Last Dose  . acetaminophen (TYLENOL) tablet 650 mg  650 mg Oral Q6H PRN Fritzi Mandes, MD       Or  . acetaminophen (TYLENOL) suppository 650 mg  650 mg Rectal Q6H PRN Fritzi Mandes, MD      . albuterol (PROVENTIL) (2.5 MG/3ML) 0.083% nebulizer solution 2.5 mg  2.5 mg Nebulization Q6H PRN Fritzi Mandes, MD      . Derrill Memo ON 08/20/2015] azithromycin (ZITHROMAX) tablet 250 mg  250 mg Oral Daily Fritzi Mandes, MD      . bisacodyl (DULCOLAX) suppository 10 mg  10 mg Rectal Daily PRN Fritzi Mandes, MD      . carbamazepine (TEGRETOL) tablet 200 mg  200 mg Oral BID Fritzi Mandes, MD   200 mg at 08/19/15 0934  . [START ON 08/20/2015] cefUROXime (CEFTIN) tablet 500 mg  500 mg Oral BID WC Fritzi Mandes, MD      . docusate sodium (COLACE) capsule 100 mg  100 mg Oral BID Fritzi Mandes, MD   100 mg at 08/19/15 0934  . heparin injection 5,000 Units  5,000 Units Subcutaneous Q8H Fritzi Mandes, MD   5,000 Units at 08/17/15 1613  . insulin aspart (novoLOG) injection 0-9 Units  0-9 Units Subcutaneous TID WC Fritzi Mandes, MD   1 Units at 08/19/15 1649  . ipratropium-albuterol (DUONEB) 0.5-2.5 (3) MG/3ML nebulizer solution 3 mL  3 mL Nebulization Q6H Flora Lipps, MD   3 mL at 08/19/15 1459  . losartan (COZAAR) tablet 25 mg  25 mg Oral QHS Murlean Iba, MD   25 mg at 08/18/15  2132  . ondansetron (ZOFRAN) tablet 4 mg  4 mg Oral Q6H PRN Fritzi Mandes, MD       Or  . ondansetron (ZOFRAN) injection 4 mg  4 mg Intravenous Q6H PRN Fritzi Mandes, MD      . Derrill Memo ON 08/20/2015] predniSONE (DELTASONE) tablet 60 mg  60 mg Oral Q breakfast Fritzi Mandes, MD      . QUEtiapine (SEROQUEL) tablet 100 mg  100 mg Oral Daily Fritzi Mandes, MD   100 mg at 08/19/15 0954  . QUEtiapine (SEROQUEL) tablet 600 mg  600 mg Oral QHS Fritzi Mandes, MD   600 mg at 08/18/15 2133  . thiothixene (NAVANE) capsule 10 mg  10 mg Oral BID Fritzi Mandes, MD   10 mg at 08/19/15 2330    Musculoskeletal: Strength & Muscle Tone: within normal limits Gait & Station: normal Patient leans: N/A  Psychiatric Specialty Exam: Physical Exam  Constitutional: He appears well-developed and well-nourished.  HENT:  Head: Normocephalic and atraumatic.  Eyes: Conjunctivae are normal. Pupils are equal, round, and reactive to light.  Neck: Normal range of motion.  Cardiovascular: Normal heart sounds.   Respiratory: He is in respiratory distress.  GI: Soft.  Musculoskeletal: Normal range of motion.  Neurological: He is alert.  Skin: Skin is warm and dry.  Psychiatric: He has a normal mood and affect. Judgment and thought content normal. His speech is delayed. He is slowed. Cognition and memory  are normal.    Review of Systems  Constitutional: Positive for malaise/fatigue.  HENT: Negative.   Eyes: Negative.   Respiratory: Positive for shortness of breath.   Cardiovascular: Negative.   Gastrointestinal: Negative.   Musculoskeletal: Negative.   Skin: Negative.   Neurological: Positive for weakness.  Psychiatric/Behavioral: Negative.     Blood pressure 122/94, pulse 48, temperature 98.1 F (36.7 C), temperature source Oral, resp. rate 20, height 5' 6"  (1.676 m), weight 84.369 kg (186 lb), SpO2 85 %.Body mass index is 30.04 kg/(m^2).  General Appearance: Fairly Groomed  Eye Contact:  Good  Speech:  Slow  Volume:  Decreased   Mood:  Euthymic  Affect:  Appropriate  Thought Process:  Goal Directed  Orientation:  Full (Time, Place, and Person)  Thought Content:  Logical  Suicidal Thoughts:  No  Homicidal Thoughts:  No  Memory:  Immediate;   Good Recent;   Fair Remote;   Fair  Judgement:  Fair  Insight:  Fair  Psychomotor Activity:  Normal  Concentration:  Concentration: Fair  Recall:  [employer] of Knowledge:  Fair  Language:  Fair  Akathisia:  No  Handed:  Right  AIMS (if indicated):     Assets:  Communication Skills Desire for Improvement Housing Resilience Social Support  ADL's:  Intact  Cognition:  Impaired,  Mild  Sleep:        Treatment Plan Summary: Daily contact with patient to assess and evaluate symptoms and progress in treatment, Medication management and Plan Patient appears to be doing well. Mental state is at his baseline considering how sick he is. No need to change medicine. Lots of encouragement and support. I will check by on him again during his hospitalization if possible. No need to change anything now. I'm sure we have an outpatient appointment scheduled at some point.  Disposition: Patient does not meet criteria for psychiatric inpatient admission. Supportive therapy provided about ongoing stressors.  Alethia Berthold, MD 08/19/2015 7:42 PM

## 2015-08-19 NOTE — Progress Notes (Signed)
SATURATION QUALIFICATIONS: (This note is used to comply with regulatory documentation for home oxygen)  Patient Saturations on Room Air at Rest = 88%  Patient Saturations on Room Air while Ambulating = 85%  Patient Saturations on 3 Liters of oxygen while Ambulating = 91%  Please briefly explain why patient needs home oxygen:

## 2015-08-19 NOTE — Progress Notes (Signed)
[facility] [place] [facility]     Assessment and Plan:  Chronic interstitial lung disease. -Review of CT images of the chest shows bibasilar interstitial lung changes with traction bronchiectasis. Interstitial changes were seen on previous chest x-ray on 01/22/15. There is also hilar, paratracheal lymphadenopathy.  -Will need further workup of the patient's interstitial lung disease, including workup for possible sarcoidosis, Wegener's. Given his history of developmental disability, we will work this up conservatively with serologic testing first and consider further invasive testing on an outpatient basis based on findings and further discussions with caregivers.  --Ok to discharge from respiratory standpoint -- continue prednisone at 40 mg daily until follow up. My office will be calling him to arrange follow up.   Acute respiratory failure, due to volume overload. -Volume overload, related to acute on chronic renal failure. -Wean down oxygen as tolerated. -Continue antibiotics, can complete course outpatient.  -Wean down steroids, may need outpatient oxygen.  Bipolar disorder. -With schizophrenia, developmental disorder. Given these difficulties, discussion with the patient's mother. We will try to treat conservatively, and consider further interventional procedures outpatient if needed. He will likely require general anesthesia with bronchoscopy.  Chronic renal failure.  -Combination of ILD and renal disease, ?immunological disorder.    Pulmonary service will sign off for now, please call if there are any further questions or concerns.    Date: 08/19/2015  MRN# [id number] Walter Wong 29/29/1987   Walter Wong is a 30 y.o. old male seen in follow up for chief complaint of  Chief Complaint  Patient presents with  . Panic Attack   No new complaints today, feels that breathing is doing well.    Allergies:  Review of patient's allergies indicates no known  allergies.  Review of Systems: Gen:  Denies  fever, sweats. HEENT: Denies blurred vision. Cvc:  No dizziness, chest pain or heaviness Resp:   Denies cough or sputum porduction. Gi: Denies swallowing difficulty, stomach pain.  Gu:  Denies bladder incontinence, burning urine Ext:   No Joint pain, stiffness. Skin: No skin rash, easy bruising. Endoc:  No polyuria, polydipsia. Psych: No depression, insomnia. Other:  All other systems were reviewed and found to be negative other than what is mentioned in the HPI.   Physical Examination:   VS: BP 122/94 mmHg  Pulse 48  Temp(Src) 98.1 F (36.7 C) (Oral)  Resp 20  Ht  (1.676 m)  Wt 186 lb (84.369 kg)  BMI 30.04 kg/m2  SpO2 92%  General Appearance: No distress  Neuro:without focal findings,  speech normal,  HEENT: PERRLA, EOM intact. Pulmonary: normal breath sounds, No wheezing.   CardiovascularNormal S1,S2.  No m/r/g.   Abdomen: Benign, Soft, non-tender. Renal:  No costovertebral tenderness  GU:  Not performed at this time. Endoc: No evident thyromegaly, no signs of acromegaly. Skin:   warm, no rash. Extremities: normal, no cyanosis, clubbing.   LABORATORY PANEL:   CBC  Recent Labs Lab 08/17/15 0605  WBC 13.4*  HGB 11.8*  HCT 37.1*  PLT 341   ------------------------------------------------------------------------------------------------------------------  Chemistries   Recent Labs Lab 08/17/15 0605  08/19/15 0348  NA 139  < > 136  K 3.7  < > 4.3  CL 103  < > 104  CO2 26  < > 26  GLUCOSE 116*  < > 120*  BUN 25*  < > 21*  CREATININE 2.60*  < > 1.94*  CALCIUM 8.2*  < > 8.8*  AST 29  --   --  ALT 11*  --   --   ALKPHOS 51  --   --   BILITOT 0.3  --   --   < > = values in this interval not displayed. ------------------------------------------------------------------------------------------------------------------  Cardiac Enzymes  Recent Labs Lab 08/17/15 0605  TROPONINI 0.06*    ------------------------------------------------------------  RADIOLOGY:   No results found for this or any previous visit. Results for orders placed during the hospital encounter of 01/22/15  DG Chest 2 View   Narrative CLINICAL DATA:  Shortness of breath with exertion  EXAM: CHEST  2 VIEW  COMPARISON:  Jul 17, 2010  FINDINGS: Degree of inspiration is shallow. There is bibasilar interstitial edema. Heart is upper normal in size with mild pulmonary venous hypertension. No adenopathy. No bone lesions. No pneumothorax.  IMPRESSION: Findings indicative of a degree of congestive heart failure. No airspace consolidation.   Electronically Signed   By: Bretta BangWilliam  Woodruff III M.D.   On: 01/22/2015 11:53    ------------------------------------------------------------------------------------------------------------------  Thank  you for allowing [facility] to assist in the care of your patient. Our recommendations are noted above.  Please contact us if we can be of further service.   [name], MD.  [place] [facility] Office Number: [phone]  [name], M.D.  [name], M.D.  [name], M.D  08/19/2015

## 2015-08-20 ENCOUNTER — Telehealth: Payer: Self-pay | Admitting: *Deleted

## 2015-08-20 ENCOUNTER — Telehealth: Payer: Self-pay

## 2015-08-20 LAB — GLUCOSE, CAPILLARY
GLUCOSE-CAPILLARY: 77 mg/dL (ref 65–99)
GLUCOSE-CAPILLARY: 91 mg/dL (ref 65–99)

## 2015-08-20 MED ORDER — AZITHROMYCIN 250 MG PO TABS: ORAL_TABLET | ORAL | Status: DC

## 2015-08-20 MED ORDER — CEFUROXIME AXETIL 500 MG PO TABS: 500.0000 mg | ORAL_TABLET | Freq: Two times a day (BID) | ORAL | Status: DC

## 2015-08-20 MED ORDER — LOSARTAN POTASSIUM 25 MG PO TABS: 25.0000 mg | ORAL_TABLET | Freq: Every day | ORAL | Status: DC

## 2015-08-20 MED ORDER — PREDNISONE 10 MG PO TABS: 60.0000 mg | ORAL_TABLET | Freq: Every day | ORAL | Status: DC

## 2015-08-20 NOTE — Progress Notes (Signed)
Pt. Discharged to home via wc. Discharge instructions and medication regimen reviewed at bedside with patient and mother. Both verbalize understanding of instructions and medication regimen. Prescriptions sent to pharmacy, pt aware. Patient assessment unchanged from this morning. TELE and IV discontinued per policy.

## 2015-08-20 NOTE — Telephone Encounter (Signed)
-----   Message from Shane CrutchPradeep Ramachandran, MD sent at 08/19/2015  2:02 PM EDT ----- Regarding: hfu Please arrange follow up with me in 2-4 weeks with 2 view CXR. Thanks.

## 2015-08-20 NOTE — Care Management (Addendum)
Patient is for discharge home today.  Has qualified for home 02 and agency preference is [facility].  There is discussion that patient may qualify for [facility] and this is being investigated. He does have hisotry of schizoaffective disorder.   Patient lives with his mother and brother.  Able to perform his own adls. Current with his PCP Dr Butler DenmarkWroth.  Confirmed all of his contact information.  His personal cell phone number is [phone].  He will greatly benefit from home health nursing to present readmission.. he is iin agreement a preference is [facility].  Requested order from attending.  Patient's mother will transport home.  [facility]

## 2015-08-20 NOTE — Progress Notes (Signed)
Physical Therapy Treatment Patient Details Name: Walter Wong MRN: [id number] DOB: 10/17/1985 Today's Date: 08/20/2015    History of Present Illness Pt is a 30 y/o male that presents with increased shortness of breath, found to have pneumonia. He has a history of CKD, bipolar disorder and shizophrenia.     PT Comments    Pt resting in bed and agrees to ambulation.  He was able to ambulate around nursing unit with assist for O2 tank and it's management.  No lob's noted and ambulation generally safe.  Reviewed energy conservation techniques for home management.   Follow Up Recommendations  No PT follow up     Equipment Recommendations       Recommendations for Other Services       Precautions / Restrictions Restrictions Weight Bearing Restrictions: No    Mobility  Bed Mobility Overal bed mobility: Independent             General bed mobility comments: No deficits with bed mobility.   Transfers Overall transfer level: Independent Equipment used: None                Ambulation/Gait Ambulation/Gait assistance: Independent Ambulation Distance (Feet): 160 Feet Assistive device: None Gait Pattern/deviations: Step-through pattern   Gait velocity interpretation: Below normal speed for age/gender General Gait Details: respiratory rate stayed generally stable throughtout gait.   Stairs            Wheelchair Mobility    Modified Rankin (Stroke Patients Only)       Balance Overall balance assessment: Independent                                  Cognition Arousal/Alertness: Awake/alert Behavior During Therapy: WFL for tasks assessed/performed Overall Cognitive Status: History of cognitive impairments - at baseline                      Exercises      General Comments        Pertinent Vitals/Pain Pain Assessment: No/denies pain    Home Living                      Prior Function            PT Goals  (current goals can now be found in the care plan section) Acute Rehab PT Goals Patient Stated Goal: To return home     Frequency  Min 2X/week    PT Plan Current plan remains appropriate    Co-evaluation             End of Session Equipment Utilized During Treatment: Gait belt;Oxygen Activity Tolerance: Patient tolerated treatment well Patient left: in bed;with call bell/phone within reach;with bed alarm set     Time: [id number] PT Time Calculation (min) (ACUTE ONLY): 8 min  Charges:  $Gait Training: 8-22 mins                    G Codes:      Danielle DessSarah Nica Friske, PTA 08/20/2015, 11:49 AM

## 2015-08-20 NOTE — Discharge Instructions (Signed)
Use oxygen as instructed Use your incentive spirometer and flutter valve at home

## 2015-08-20 NOTE — Discharge Summary (Signed)
[facility] - [place] at [facility]   PATIENT NAME: Walter Wong    MR#:  [id number]  DATE OF BIRTH:  [birth year]  DATE OF ADMISSION:  08/17/2015 ADMITTING PHYSICIAN: Enedina Finner, MD  DATE OF DISCHARGE: 08/20/15  PRIMARY CARE PHYSICIAN: Mickel Fuchs, MD    ADMISSION DIAGNOSIS:  Hypoxia [R09.02] CAP (community acquired pneumonia) [J18.9] Sepsis, due to unspecified organism (HCC) [A41.9] Fever, unspecified fever cause [R50.9]  DISCHARGE DIAGNOSIS:  Severe bilateral Bronchiectasis Sepsis on admission-resolved Hypoxia and now on oxygen  SECONDARY DIAGNOSIS:   Past Medical History  Diagnosis Date  . ADHD (attention deficit hyperactivity disorder)   . Diabetes mellitus, type II (HCC)   . Bipolar disorder (HCC)   . Schizophrenia (HCC)   . Chronic kidney disease     HOSPITAL COURSE:  30 year old male with past medical history of ADHD, previous smoker, diabetes, chronic kidney disease stage III, who presents to the hospital due to shortness of breath.  1. Sepsis secondary to pneumonia -Presented with fever of 102.2, elevated white count, tachycardia, chest x-ray with bilateral coarse infiltrates - afebrile overnight. Cont. Rocephin, Zithromax. Hemodynamically stable.  -change to oral abxs today -BC negative  2. Acute on chronic hypoxic respiratory failure secondary to pneumonia/Chronic lung disease.  - CT chest w/out contrast showing Bronchiectasis/mediastinal lymphadenopathy. ?? Sarcoidosis.  - started on IV steroids and appreciate Pulmonary Input. Cont. Empiric abx as mentioned above.  -pt qualifies for home oxygen use -taper steroids and f/u pulmonary as out pt to complete w/u for sarcoidosis  3. Acute on chronic kidney disease stage III -Baseline creatinine 1.6-1.9. Cr. Today 2.1--1.94 - appreciate Nephro input and hx of FSGS and likely needs to be started on ACE and discussed w/ Dr. Thedore Mins.  - Renal dose meds and avoid nephrotoxins.    4. History of diabetes - BS stable. Cont. SSI.  - stable sugars. Not on any meds  6. History of bipolar disorder along with schizophrenia - cont. Seroquel, Tegretol, Thiothixene.  - Psych consult w/ Dr. Toni Amend appreciated. No change in meds - for anxiety can use PRN ativan   D/w pulmonary. D/c today with oxygen and outpt f/u  CONSULTS OBTAINED:  Treatment Team:  Mosetta Pigeon, MD Audery Amel, MD  DRUG ALLERGIES:  No Known Allergies  DISCHARGE MEDICATIONS:   Current Discharge Medication List    START taking these medications   Details  azithromycin (ZITHROMAX) 250 MG tablet Take as directed Qty: 6 each, Refills: 0    cefUROXime (CEFTIN) 500 MG tablet Take 1 tablet (500 mg total) by mouth 2 (two) times daily with a meal. Qty: 14 tablet, Refills: 0    losartan (COZAAR) 25 MG tablet Take 1 tablet (25 mg total) by mouth at bedtime. Qty: 30 tablet, Refills: 0    predniSONE (DELTASONE) 10 MG tablet Take 6 tablets (60 mg total) by mouth daily with breakfast. Taper by 10 mg daily then stop Qty: 21 tablet, Refills: 0      CONTINUE these medications which have NOT CHANGED   Details  carbamazepine (TEGRETOL) 200 MG tablet Take 1 tablet (200 mg total) by mouth 2 (two) times daily. Qty: 60 tablet, Refills: 5    furosemide (LASIX) 20 MG tablet Take 1 tablet (20 mg total) by mouth 2 (two) times daily. Qty: 30 tablet, Refills: 0    !! QUEtiapine (SEROQUEL) 100 MG tablet Take 1 tablet (100 mg total) by mouth daily. Qty: 30 tablet, Refills: 5    !! QUEtiapine (SEROQUEL)  300 MG tablet Take 2 tablets (600 mg total) by mouth at bedtime. Qty: 60 tablet, Refills: 5    thiothixene (NAVANE) 10 MG capsule Take 1 capsule (10 mg total) by mouth 2 (two) times daily. Qty: 60 capsule, Refills: 60     !! - Potential duplicate medications found. Please discuss with provider.      If you experience worsening of your admission symptoms, develop shortness of breath, life threatening  emergency, suicidal or homicidal thoughts you must seek medical attention immediately by calling 911 or calling your MD immediately  if symptoms less severe.  You Must read complete instructions/literature along with all the possible adverse reactions/side effects for all the Medicines you take and that have been prescribed to you. Take any new Medicines after you have completely understood and accept all the possible adverse reactions/side effects.   Please note  You were cared for by a hospitalist during your hospital stay. If you have any questions about your discharge medications or the care you received while you were in the hospital after you are discharged, you can call the unit and asked to speak with the hospitalist on call if the hospitalist that took care of you is not available. Once you are discharged, your primary care physician will handle any further medical issues. Please note that NO REFILLS for any discharge medications will be authorized once you are discharged, as it is imperative that you return to your primary care physician (or establish a relationship with a primary care physician if you do not have one) for your aftercare needs so that they can reassess your need for medications and monitor your lab values. Today   SUBJECTIVE   Excited to go home!!  VITAL SIGNS:  Blood pressure 95/57, pulse 84, temperature 97.5 F (36.4 C), temperature source Oral, resp. rate 16, height 5\' 6"  (1.676 m), weight 84.369 kg (186 lb), SpO2 100 %.  I/O:   Intake/Output Summary (Last 24 hours) at 08/20/15 0759 Last data filed at 08/20/15 0758  Gross per 24 hour  Intake    480 ml  Output   4000 ml  Net  -3520 ml    PHYSICAL EXAMINATION:  GENERAL:  30 y.o.-year-old patient lying in the bed with no acute distress.  EYES: Pupils equal, round, reactive to light and accommodation. No scleral icterus. Extraocular muscles intact.  HEENT: Head atraumatic, normocephalic. Oropharynx and  nasopharynx clear.  NECK:  Supple, no jugular venous distention. No thyroid enlargement, no tenderness.  LUNGS: Normal breath sounds bilaterally, no wheezing, rales,rhonchi or crepitation. No use of accessory muscles of respiration.  CARDIOVASCULAR: S1, S2 normal. No murmurs, rubs, or gallops.  ABDOMEN: Soft, non-tender, non-distended. Bowel sounds present. No organomegaly or mass.  EXTREMITIES: No pedal edema, cyanosis, or clubbing.  NEUROLOGIC: Cranial nerves II through XII are intact. Muscle strength 5/5 in all extremities. Sensation intact. Gait not checked.  PSYCHIATRIC: The patient is alert and oriented x 3.  SKIN: No obvious rash, lesion, or ulcer.   DATA REVIEW:   CBC   Recent Labs Lab 08/17/15 0605  WBC 13.4*  HGB 11.8*  HCT 37.1*  PLT 341    Chemistries   Recent Labs Lab 08/17/15 0605  08/19/15 0348  NA 139  < > 136  K 3.7  < > 4.3  CL 103  < > 104  CO2 26  < > 26  GLUCOSE 116*  < > 120*  BUN 25*  < > 21*  CREATININE 2.60*  < >  1.94*  CALCIUM 8.2*  < > 8.8*  AST 29  --   --   ALT 11*  --   --   ALKPHOS 51  --   --   BILITOT 0.3  --   --   < > = values in this interval not displayed.  Microbiology Results   Recent Results (from the past 240 hour(s))  Culture, blood (routine x 2)     Status: None (Preliminary result)   Collection Time: 08/17/15  6:46 AM  Result Value Ref Range Status   Specimen Description BLOOD LEFT FOREARM  Final   Special Requests   Final    BOTTLES DRAWN AEROBIC AND ANAEROBIC AER 8ML ANA 2ML   Culture NO GROWTH 2 DAYS  Final   Report Status PENDING  Incomplete  Urine culture     Status: None   Collection Time: 08/17/15  6:46 AM  Result Value Ref Range Status   Specimen Description URINE, RANDOM  Final   Special Requests Normal  Final   Culture NO GROWTH [facility]   Final   Report Status 08/18/2015 FINAL  Final  Culture, blood (routine x 2)     Status: None (Preliminary result)   Collection Time:  08/17/15  6:48 AM  Result Value Ref Range Status   Specimen Description BLOOD RIGHT AC  Final   Special Requests BOTTLES DRAWN AEROBIC AND ANAEROBIC  18ML  Final   Culture NO GROWTH 2 DAYS  Final   Report Status PENDING  Incomplete    RADIOLOGY:  Ct Chest Wo Contrast  08/18/2015  CLINICAL DATA:  MD notes: 30 year old male with past medical history significant for ADHD, diabetes, bipolar disorder, chronic kidney disease. Patient lives in a group home. Patient presented to ED with fever off 102.54F. Patient denies any chest pain. EXAM: CT CHEST WITHOUT CONTRAST TECHNIQUE: Multidetector CT imaging of the chest was performed following the standard protocol without IV contrast. COMPARISON:  Chest radiograph 08/17/2015 FINDINGS: Mediastinum/Nodes: Mild axillary adenopathy. RIGHT hilar axial lymph node measures 10 mm on image 5, series 2 Mild mediastinal adenopathy difficult to assess on this noncontrast exam. Subcarinal lymph node measures 20 mm. RIGHT paratracheal lymph node measures 10 mm. There is extensive peribronchial hilar thickening best seen on coronal imaging (Image 85, series 5. Lungs/Pleura: Bibasilar moderate to severe severe bronchiectasis with extensive peribronchial thickening the nodularity. Bronchiectasis in the RIGHT middle lobe and inferior lingula. Upper lobe bronchiectasis is also present to lesser degree. The groove interlobular peribronchial thickening is less in the upper lobes and lower lobes. There is diffuse ground-glass opacities throughout the lungs suggesting atelectasis. No discrete nodularity. Upper abdomen: Normal spleen on noncontrast exam. Normal limited view of the liver. Musculoskeletal: No aggressive osseous lesion. IMPRESSION: 1. SEVERE MULTILOBAR BRONCHIECTASIS with lower lobe predominance. 2. Extensive central perihilar peribronchial thickening and mild mediastinal lymphadenopathy. 3. Findings are consistent chronic pulmonary infectious / inflammatory process. Recommend  evaluation for SARCOIDOSIS. Chronic aspiration or remote infection could have a similar pattern bronchiectasis. Electronically Signed   By: Genevive BiStewart  Edmunds M.D.   On: 08/18/2015 11:05     Management plans discussed with the patient, family and they are in agreement.  CODE STATUS:     Code Status Orders        Start     Ordered   08/17/15 1435  Full code   Continuous     08/17/15 1435    Code Status History    Date Active Date Inactive  Code Status Order ID Comments User Context   01/22/2015  5:27 PM 01/24/2015  2:25 PM Full Code [id number]  Houston Siren, MD Inpatient      TOTAL TIME TAKING CARE OF THIS PATIENT: 40 minutes.    Shary Lamos M.D on 08/20/2015 at 7:59 AM  Between 7am to 6pm - Pager - [phone] After 6pm go to [email or web address] - password [email or web address] [facility]  [place]  [facility]  Office  [phone]  CC: Primary care physician; Mickel Fuchs, MD

## 2015-08-20 NOTE — Care Management Important Message (Signed)
Important Message  Patient Details  Name: Walter Wong MRN: [id number] Date of Birth: 02/20/1986   Medicare Important Message Given:  Yes    Eber HongGreene, Baron Parmelee R, RN 08/20/2015, 9:01 AM

## 2015-08-20 NOTE — Telephone Encounter (Signed)
Tried to call pt to schedule f/u appt with CXR prior. Number kept ringing. No VM available. Will call back later.

## 2015-08-20 NOTE — Progress Notes (Signed)
Subjective:  Patient known to our practice from outpatient.  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  He states he could not breathe right. Diagnostic workup so far has included cT scan which showed bronchiectasis and interstitial lung disease Pulmonary team is following Serum creatinine slightly improved to 1.94/GFR 52  Objective:  Vital signs in last 24 hours:  Temp:  [97.3 F (36.3 C)-98.1 F (36.7 C)] 98.1 F (36.7 C) (06/09 1211) Pulse Rate:  [84-97] 97 (06/09 1211) Resp:  [16] 16 (06/09 0518) BP: (95-143)/(57-107) 124/90 mmHg (06/09 1211) SpO2:  [92 %-100 %] 92 % (06/09 1211)  Weight change:  Filed Weights   08/17/15 0533 08/17/15 1533  Weight: 98.431 kg (217 lb) 84.369 kg (186 lb)    Intake/Output:    Intake/Output Summary (Last 24 hours) at 08/20/15 1605 Last data filed at 08/20/15 1304  Gross per 24 hour  Intake    480 ml  Output   2750 ml  Net  -2270 ml     Physical Exam: General: No acute distress, laying in bed  HEENT Anicteric, moist mucous membranes  Neck supple  Pulm/lungs normal effort, clear to auscultation  CVS/Heart Regular rate and rhythm  Abdomen:  Soft, nondistended, nontender  Extremities: No peripheral edema  Neurologic: Alert, oriented  Skin: No acute rashes          Basic Metabolic Panel:   Recent Labs Lab 08/17/15 0605 08/18/15 0406 08/19/15 0348  NA 139 136 136  K 3.7 3.6 4.3  CL 103 104 104  CO2 GLUCOSE 116* 90 120*  BUN 25* 22* 21*  CREATININE 2.60* 2.15* 1.94*  CALCIUM 8.2* 8.0* 8.8*     CBC:  Recent Labs Lab 08/17/15 0605  WBC 13.4*  NEUTROABS 11.6*  HGB 11.8*  HCT 37.1*  MCV 68.3*  PLT 341      Microbiology:  Recent Results (from the past 720 hour(s))  Culture, blood (routine x 2)     Status: None (Preliminary result)   Collection  Time: 08/17/15  6:46 AM  Result Value Ref Range Status   Specimen Description BLOOD LEFT FOREARM  Final   Special Requests   Final    BOTTLES DRAWN AEROBIC AND ANAEROBIC AER ANA   Culture NO GROWTH 3 DAYS  Final   Report Status PENDING  Incomplete  Urine culture     Status: None   Collection Time: 08/17/15  6:46 AM  Result Value Ref Range Status   Specimen Description URINE, RANDOM  Final   Special Requests Normal  Final   Culture NO GROWTH Performed at [facility]   Final   Report Status 08/18/2015 FINAL  Final  Culture, blood (routine x 2)     Status: None (Preliminary result)   Collection Time: 08/17/15  6:48 AM  Result Value Ref Range Status   Specimen Description BLOOD RIGHT AC  Final   Special Requests BOTTLES DRAWN AEROBIC AND ANAEROBIC   Final   Culture NO GROWTH 3 DAYS  Final   Report Status PENDING  Incomplete    Coagulation Studies: No results for input(s): LABPROT, INR in the last 72 hours.  Urinalysis: No results for input(s): COLORURINE, LABSPEC, PHURINE, GLUCOSEU, HGBUR, BILIRUBINUR, KETONESUR, PROTEINUR, UROBILINOGEN, NITRITE, LEUKOCYTESUR in the last 72 hours.  Invalid input(s): APPERANCEUR    Imaging:  No results found.   Medications:     . azithromycin  250 mg Oral Daily  . carbamazepine  200 mg Oral BID  . cefUROXime  500 mg Oral BID WC  . docusate sodium  100 mg Oral BID  . heparin  5,000 Units Subcutaneous Q8H  . insulin aspart  0-9 Units Subcutaneous TID WC  . ipratropium-albuterol  3 mL Nebulization Q6H  . losartan  25 mg Oral QHS  . predniSONE  60 mg Oral Q breakfast  . QUEtiapine  100 mg Oral Daily  . QUEtiapine  600 mg Oral QHS  . thiothixene  10 mg Oral BID   acetaminophen **OR** acetaminophen, albuterol, bisacodyl, ondansetron **OR** ondansetron (ZOFRAN) IV  Assessment/ Plan:  30 y.o.African American male with history of bipolar disorder, ADHD, history of smoking,chronic kidney disease presents for shortness of  breath  1. Chronic kidney disease stage III with acute renal failure-  Patient has biopsy-proven FSGS Recent worsening is likely due to volume shifts Out patient recent creatinine is 1.88 Today's creatinine is 1.94 which is close to his baseline  2. Proteinuria/FSGS Continue low-dose ARB  3. New diagnoses of severe bronchiectasis and mediastinal lymphadenopathy Pulmonary evaluation in progress Patient was started on prednisone    LOS: 3 Iceis Knab 6/9/20174:05 PM

## 2015-08-23 ENCOUNTER — Encounter: Payer: Self-pay | Admitting: *Deleted

## 2015-08-23 NOTE — Telephone Encounter (Signed)
Tried to call pt but number keeps ringing with no VM available. Will mail letter to pt.

## 2015-08-25 LAB — CULTURE, BLOOD (ROUTINE X 2)
Culture: NO GROWTH
Culture: NO GROWTH

## 2015-08-25 LAB — MPO/PR-3 (ANCA) ANTIBODIES: Myeloperoxidase Abs: <9 U/mL (ref 0.0–9.0)

## 2015-08-25 LAB — ANGIOTENSIN CONVERTING ENZYME: Angiotensin-Converting Enzyme: 19 U/L (ref 14–82)

## 2015-08-25 LAB — RHEUMATOID FACTOR: RHEUMATOID FACTOR: 12.7 [IU]/mL (ref 0.0–13.9)

## 2015-08-25 LAB — ANCA TITERS
Atypical P-ANCA titer: <1:20 {titer}
C-ANCA: <1:20 {titer}

## 2015-08-25 LAB — CYCLIC CITRUL PEPTIDE ANTIBODY, IGG/IGA: CCP ANTIBODIES IGG/IGA: 8 U (ref 0–19)

## 2015-08-26 ENCOUNTER — Inpatient Hospital Stay: Payer: Medicare Other | Admitting: Internal Medicine

## 2015-08-30 ENCOUNTER — Encounter: Payer: Self-pay | Admitting: Internal Medicine

## 2015-08-30 ENCOUNTER — Ambulatory Visit (INDEPENDENT_AMBULATORY_CARE_PROVIDER_SITE_OTHER): Payer: Medicare Other | Admitting: Internal Medicine

## 2015-08-30 VITALS — BP 118/72 | HR 113 | Ht 66.0 in | Wt 210.0 lb

## 2015-08-30 DIAGNOSIS — J849 Interstitial pulmonary disease, unspecified: Secondary | ICD-10-CM | POA: Diagnosis not present

## 2015-08-30 MED ORDER — TIOTROPIUM BROMIDE MONOHYDRATE 18 MCG IN CAPS
18.0000 ug | ORAL_CAPSULE | Freq: Every day | RESPIRATORY_TRACT | Status: AC
Start: 2015-08-30 — End: 2016-08-29

## 2015-08-30 NOTE — Patient Instructions (Addendum)
Take slow deep breaths.   -Continue oxygen at night and as needed during the day.  --Spiriva handihaler once daily.  --CXR 2 view  in 6 weeks and follow up at that time.

## 2015-08-30 NOTE — Progress Notes (Signed)
[facility]* [facility]     Assessment and Plan:  Interstitial lung disease. -Serological workup negative. -Interstitial changes with groundglass changes,  more consistent with an NSIP pattern, than UIP. -Uncertain if changes seen previously on CT scan were due to acute pneumonia changes with combination of pulmonary edema versus chronic changes of pulmonary fibrosis. His respiratory status appears to be improved today, with improved oxygenation. -We'll repeat chest x-ray again in approximately 6 weeks' time and see the patient again at that time. If interstitial changes are persistent at that time we'll repeat a CT of the chest high res. -Given the patient's developmental disability, psychiatric diagnoses, will try to take a conservative approach as the patient is unlikely to really understand his diagnoses, as well as the multiple interventions that would be required  Acute respiratory failure. -The patient was ambulated in the office today, initial oxygen saturation was 94% on room air with heart rate 110, the patient was ambulated 600 feet. Oxygen saturation at the end of the walk was 90% with a heart rate of 125. After the walk. The patient had some desats to the mid 80s, he appeared to take very small and shallow breaths, he was encouraged to take normal breaths at which time the oxygen saturation improved to above 90%. -It was recommended that he could continue using oxygen at night, and as needed during the day. He would not appear that he requires oxygen with ambulation at all times during the day.  Bronchiectasis. -Likely secondary to traction from interstitial lung disease. Does not appear to be primary bronchiectasis.     Date: 08/30/2015  MRN# [id number] Walter Wong 03/17/1985   Walter AntisJason A Bleau is a 30 y.o. old male seen in follow up for chief complaint of  Chief Complaint  Patient presents with  . Hospitalization Follow-up    pt states breathing has improved  since hosp. cont sob, prod cough white in color. fatigued, weakness. denies cp/tightness or wheezing     HPI:   Patient is a 30 year old group home resident with bipolar disorder and schizophrenia. He was recently seen in the hospital, it was noted that he had mild hilar and paratracheal lymphadenopathy, CT of the chest 08/18/15 showed interstitial lung disease with bronchiectasis. He was started on empiric steroids, and serology testing was sent as part of an ILD workup. This included Anca, Ace level,, CCP, RA, all of which were negative.  Today, he appears to be doing much better, he notes that his breathing is much better than when he was in the hospital. He has no new complaints today. He is in good spirits today, he is accompanied by his mother. He says that he has been doing much better.  Medication:   Outpatient Encounter Prescriptions as of 08/30/2015  Medication Sig  . azithromycin (ZITHROMAX) 250 MG tablet Take as directed  . carbamazepine (TEGRETOL) 200 MG tablet Take 1 tablet (200 mg total) by mouth 2 (two) times daily.  . cefUROXime (CEFTIN) 500 MG tablet Take 1 tablet (500 mg total) by mouth 2 (two) times daily with a meal.  . furosemide (LASIX) 20 MG tablet Take 1 tablet (20 mg total) by mouth 2 (two) times daily.  Marland Kitchen. losartan (COZAAR) 25 MG tablet Take 1 tablet (25 mg total) by mouth at bedtime.  . predniSONE (DELTASONE) 10 MG tablet Take 6 tablets (60 mg total) by mouth daily with breakfast. Taper by 10 mg daily then stop  . QUEtiapine (SEROQUEL) 100 MG tablet Take  1 tablet (100 mg total) by mouth daily.  . QUEtiapine (SEROQUEL) 300 MG tablet Take 2 tablets (600 mg total) by mouth at bedtime.  Marland Kitchen thiothixene (NAVANE) 10 MG capsule Take 1 capsule (10 mg total) by mouth 2 (two) times daily.   No facility-administered encounter medications on file as of 08/30/2015.     Allergies:  Review of patient's allergies indicates no known allergies.  Review of Systems: Gen:  Denies   fever, sweats. HEENT: Denies blurred vision. Cvc:  No dizziness, chest pain or heaviness Resp:   Denies cough or sputum porduction. Gi: Denies swallowing difficulty, stomach pain. constipation, bowel incontinence Gu:  Denies bladder incontinence, burning urine Ext:   No Joint pain, stiffness. Skin: No skin rash, easy bruising. Endoc:  No polyuria, polydipsia. Psych: No depression, insomnia. Other:  All other systems were reviewed and found to be negative other than what is mentioned in the HPI.   Physical Examination:   VS: BP 118/72 mmHg  Pulse 113  Ht  (1.676 m)  Wt 210 lb (95.255 kg)  BMI 33.91 kg/m2  SpO2 93%  General Appearance: No distress  Neuro:without focal findings,  speech normal,  HEENT: PERRLA, EOM intact. Pulmonary: normal breath sounds, No wheezing.  Small shallow respirations. CardiovascularNormal S1,S2.  No m/r/g.   Abdomen: Benign, Soft, non-tender. Renal:  No costovertebral tenderness  GU:  Not performed at this time. Endoc: No evident thyromegaly, no signs of acromegaly. Skin:   warm, no rash. Extremities: normal, no cyanosis, clubbing.   LABORATORY PANEL:   CBC No results for input(s): WBC, HGB, HCT, PLT in the last 168 hours. ------------------------------------------------------------------------------------------------------------------  Chemistries  No results for input(s): NA, K, CL, CO2, GLUCOSE, BUN, CREATININE, CALCIUM, MG, AST, ALT, ALKPHOS, BILITOT in the last 168 hours.  Invalid input(s): GFRCGP ------------------------------------------------------------------------------------------------------------------  Cardiac Enzymes No results for input(s): TROPONINI in the last 168 hours. ------------------------------------------------------------  RADIOLOGY:   No results found for this or any previous visit. Results for orders placed during the hospital encounter of 01/22/15  DG Chest 2 View   Narrative CLINICAL DATA:  Shortness of  breath with exertion  EXAM: CHEST  2 VIEW  COMPARISON:  Jul 17, 2010  FINDINGS: Degree of inspiration is shallow. There is bibasilar interstitial edema. Heart is upper normal in size with mild pulmonary venous hypertension. No adenopathy. No bone lesions. No pneumothorax.  IMPRESSION: Findings indicative of a degree of congestive heart failure. No airspace consolidation.   Electronically Signed   By: Bretta Bang III M.D.   On: 01/22/2015 11:53    ------------------------------------------------------------------------------------------------------------------  Thank  you for allowing [facility], [facility] to assist in the care of your patient. Our recommendations are noted above.  Please contact us if we can be of further service.   Wells Guiles, MD.   Pulmonary and Critical Care Office Number: [phone]  Santiago Glad, M.D.  Stephanie Acre, M.D.  Billy Fischer, M.D  08/30/2015

## 2015-08-31 NOTE — Telephone Encounter (Signed)
This medication was called into the pharmacy by the physician

## 2015-08-31 NOTE — Telephone Encounter (Signed)
documentation

## 2015-10-09 ENCOUNTER — Emergency Department: Payer: Medicare Other

## 2015-10-09 ENCOUNTER — Inpatient Hospital Stay
Admission: EM | Admit: 2015-10-09 | Discharge: 2015-10-13 | DRG: 291 | Disposition: A | Discharge status: Home Health | Payer: Medicare Other | Attending: Internal Medicine | Admitting: Internal Medicine

## 2015-10-09 ENCOUNTER — Inpatient Hospital Stay
Admit: 2015-10-09 | Discharge: 2015-10-09 | Disposition: A | Discharge status: Home / Self Care | Payer: Medicare Other | Attending: Internal Medicine | Admitting: Internal Medicine

## 2015-10-09 ENCOUNTER — Encounter: Payer: Self-pay | Admitting: Emergency Medicine

## 2015-10-09 DIAGNOSIS — Z8249 Family history of ischemic heart disease and other diseases of the circulatory system: Secondary | ICD-10-CM

## 2015-10-09 DIAGNOSIS — I132 Hypertensive heart and chronic kidney disease with heart failure and with stage 5 chronic kidney disease, or end stage renal disease: Secondary | ICD-10-CM | POA: Diagnosis present

## 2015-10-09 DIAGNOSIS — J849 Interstitial pulmonary disease, unspecified: Secondary | ICD-10-CM | POA: Diagnosis present

## 2015-10-09 DIAGNOSIS — K222 Esophageal obstruction: Secondary | ICD-10-CM | POA: Diagnosis not present

## 2015-10-09 DIAGNOSIS — I5033 Acute on chronic diastolic (congestive) heart failure: Secondary | ICD-10-CM | POA: Diagnosis present

## 2015-10-09 DIAGNOSIS — R0602 Shortness of breath: Secondary | ICD-10-CM

## 2015-10-09 DIAGNOSIS — J449 Chronic obstructive pulmonary disease, unspecified: Secondary | ICD-10-CM | POA: Diagnosis present

## 2015-10-09 DIAGNOSIS — Z841 Family history of disorders of kidney and ureter: Secondary | ICD-10-CM | POA: Diagnosis not present

## 2015-10-09 DIAGNOSIS — F909 Attention-deficit hyperactivity disorder, unspecified type: Secondary | ICD-10-CM | POA: Diagnosis present

## 2015-10-09 DIAGNOSIS — E1122 Type 2 diabetes mellitus with diabetic chronic kidney disease: Secondary | ICD-10-CM | POA: Diagnosis present

## 2015-10-09 DIAGNOSIS — K224 Dyskinesia of esophagus: Secondary | ICD-10-CM

## 2015-10-09 DIAGNOSIS — I959 Hypotension, unspecified: Secondary | ICD-10-CM | POA: Diagnosis present

## 2015-10-09 DIAGNOSIS — I509 Heart failure, unspecified: Secondary | ICD-10-CM

## 2015-10-09 DIAGNOSIS — J9621 Acute and chronic respiratory failure with hypoxia: Secondary | ICD-10-CM | POA: Diagnosis present

## 2015-10-09 DIAGNOSIS — F840 Autistic disorder: Secondary | ICD-10-CM | POA: Diagnosis present

## 2015-10-09 DIAGNOSIS — F259 Schizoaffective disorder, unspecified: Secondary | ICD-10-CM | POA: Diagnosis present

## 2015-10-09 DIAGNOSIS — K449 Diaphragmatic hernia without obstruction or gangrene: Secondary | ICD-10-CM | POA: Diagnosis present

## 2015-10-09 DIAGNOSIS — R933 Abnormal findings on diagnostic imaging of other parts of digestive tract: Secondary | ICD-10-CM | POA: Diagnosis not present

## 2015-10-09 DIAGNOSIS — N183 Chronic kidney disease, stage 3 (moderate): Secondary | ICD-10-CM | POA: Diagnosis present

## 2015-10-09 DIAGNOSIS — R198 Other specified symptoms and signs involving the digestive system and abdomen: Secondary | ICD-10-CM

## 2015-10-09 DIAGNOSIS — Z79899 Other long term (current) drug therapy: Secondary | ICD-10-CM

## 2015-10-09 DIAGNOSIS — N170 Acute kidney failure with tubular necrosis: Secondary | ICD-10-CM | POA: Diagnosis present

## 2015-10-09 DIAGNOSIS — Z9981 Dependence on supplemental oxygen: Secondary | ICD-10-CM

## 2015-10-09 DIAGNOSIS — R0603 Acute respiratory distress: Secondary | ICD-10-CM

## 2015-10-09 HISTORY — DX: Interstitial pulmonary disease, unspecified: J84.9

## 2015-10-09 HISTORY — DX: Heart failure, unspecified: I50.9

## 2015-10-09 LAB — CBC WITH DIFFERENTIAL/PLATELET
Basophils Absolute: 0.1 10*3/uL (ref 0–0.1)
Basophils Relative: 1 %
EOS ABS: 0.2 10*3/uL (ref 0–0.7)
EOS PCT: 2 %
HCT: 38.5 % — ABNORMAL LOW (ref 40.0–52.0)
Hemoglobin: 12.2 g/dL — ABNORMAL LOW (ref 13.0–18.0)
LYMPHS ABS: 1.1 10*3/uL (ref 1.0–3.6)
LYMPHS PCT: 10 %
MCH: 22 pg — AB (ref 26.0–34.0)
MCHC: 31.7 g/dL — AB (ref 32.0–36.0)
MCV: 69.3 fL — AB (ref 80.0–100.0)
MONO ABS: 0.6 10*3/uL (ref 0.2–1.0)
Monocytes Relative: 5 %
Neutro Abs: 9.2 10*3/uL — ABNORMAL HIGH (ref 1.4–6.5)
Neutrophils Relative %: 82 %
PLATELETS: 356 10*3/uL (ref 150–440)
RBC: 5.55 MIL/uL (ref 4.40–5.90)
RDW: 15.8 % — AB (ref 11.5–14.5)
WBC: 11.2 10*3/uL — AB (ref 3.8–10.6)

## 2015-10-09 LAB — URINALYSIS COMPLETE WITH MICROSCOPIC (ARMC ONLY)
Bacteria, UA: NONE SEEN
Bilirubin Urine: NEGATIVE
GLUCOSE, UA: NEGATIVE mg/dL
Ketones, ur: NEGATIVE mg/dL
LEUKOCYTES UA: NEGATIVE
NITRITE: POSITIVE — AB
PH: 5 (ref 5.0–8.0)
Protein, ur: 100 mg/dL — AB
RBC / HPF: NONE SEEN RBC/hpf (ref 0–5)
SPECIFIC GRAVITY, URINE: 1.005 (ref 1.005–1.030)
Squamous Epithelial / LPF: NONE SEEN

## 2015-10-09 LAB — BLOOD GAS, ARTERIAL
ACID-BASE DEFICIT: 1.2 mmol/L (ref 0.0–2.0)
ALLENS TEST (PASS/FAIL): POSITIVE — AB
Bicarbonate: 23.6 mEq/L (ref 21.0–28.0)
FIO2: 0.44
O2 Saturation: 96.3 %
PH ART: 7.39 (ref 7.350–7.450)
Patient temperature: 37
pCO2 arterial: 39 mmHg (ref 32.0–48.0)
pO2, Arterial: 85 mmHg (ref 83.0–108.0)

## 2015-10-09 LAB — COMPREHENSIVE METABOLIC PANEL
ALT: 10 U/L — ABNORMAL LOW (ref 17–63)
ANION GAP: 9 (ref 5–15)
AST: 25 U/L (ref 15–41)
Albumin: 3.2 g/dL — ABNORMAL LOW (ref 3.5–5.0)
Alkaline Phosphatase: 60 U/L (ref 38–126)
BUN: 24 mg/dL — ABNORMAL HIGH (ref 6–20)
CHLORIDE: 105 mmol/L (ref 101–111)
CO2: 23 mmol/L (ref 22–32)
CREATININE: 2.02 mg/dL — AB (ref 0.61–1.24)
Calcium: 8.9 mg/dL (ref 8.9–10.3)
GFR, EST AFRICAN AMERICAN: 50 mL/min — AB (ref >60)
GFR, EST NON AFRICAN AMERICAN: 43 mL/min — AB (ref >60)
Glucose, Bld: 107 mg/dL — ABNORMAL HIGH (ref 65–99)
POTASSIUM: 4.5 mmol/L (ref 3.5–5.1)
SODIUM: 137 mmol/L (ref 135–145)
Total Bilirubin: 0.6 mg/dL (ref 0.3–1.2)
Total Protein: 8.8 g/dL — ABNORMAL HIGH (ref 6.5–8.1)

## 2015-10-09 LAB — GLUCOSE, CAPILLARY
GLUCOSE-CAPILLARY: 134 mg/dL — AB (ref 65–99)
GLUCOSE-CAPILLARY: 132 mg/dL — AB (ref 65–99)
Glucose-Capillary: 123 mg/dL — ABNORMAL HIGH (ref 65–99)

## 2015-10-09 LAB — PROTEIN / CREATININE RATIO, URINE
Creatinine, Urine: 26 mg/dL
Protein Creatinine Ratio: 2.96 mg/mg{Cre} — ABNORMAL HIGH (ref 0.00–0.15)
Total Protein, Urine: 77 mg/dL

## 2015-10-09 LAB — TROPONIN I
TROPONIN I: 0.05 ng/mL — AB (ref <0.03)
TROPONIN I: 0.03 ng/mL — AB (ref <0.03)
Troponin I: 0.03 ng/mL (ref <0.03)

## 2015-10-09 LAB — TSH: TSH: 1.458 u[IU]/mL (ref 0.350–4.500)

## 2015-10-09 LAB — ECHOCARDIOGRAM COMPLETE
HEIGHTINCHES: 66 in
WEIGHTICAEL: 3360 [oz_av]

## 2015-10-09 LAB — BRAIN NATRIURETIC PEPTIDE: B NATRIURETIC PEPTIDE 5: 27 pg/mL (ref 0.0–100.0)

## 2015-10-09 LAB — HEMOGLOBIN A1C: HEMOGLOBIN A1C: 5 % (ref 4.0–6.0)

## 2015-10-09 MED ORDER — IPRATROPIUM-ALBUTEROL 0.5-2.5 (3) MG/3ML IN SOLN
3.0000 mL | Freq: Once | RESPIRATORY_TRACT | Status: AC
  Administered 2015-10-09: 3 mL via RESPIRATORY_TRACT

## 2015-10-09 MED ORDER — BENZONATATE 100 MG PO CAPS
100.0000 mg | ORAL_CAPSULE | Freq: Three times a day (TID) | ORAL | Status: DC
  Administered 2015-10-09 – 2015-10-13 (×11): 100 mg via ORAL
  Filled 2015-10-09 (×12): qty 1

## 2015-10-09 MED ORDER — ACETAMINOPHEN 650 MG RE SUPP: 650.0000 mg | Freq: Four times a day (QID) | RECTAL | Status: DC | PRN

## 2015-10-09 MED ORDER — LOSARTAN POTASSIUM 25 MG PO TABS: 25.0000 mg | ORAL_TABLET | Freq: Every day | ORAL | Status: DC

## 2015-10-09 MED ORDER — INSULIN ASPART 100 UNIT/ML ~~LOC~~ SOLN
0.0000 [IU] | Freq: Three times a day (TID) | SUBCUTANEOUS | Status: DC
  Administered 2015-10-09 (×2): 1 [IU] via SUBCUTANEOUS
  Administered 2015-10-10: 2 [IU] via SUBCUTANEOUS
  Administered 2015-10-12 (×2): 1 [IU] via SUBCUTANEOUS
  Filled 2015-10-09: qty 2
  Filled 2015-10-09 (×4): qty 1

## 2015-10-09 MED ORDER — CEFTRIAXONE SODIUM 1 G IJ SOLR
1.0000 g | INTRAMUSCULAR | Status: DC
  Administered 2015-10-09 – 2015-10-11 (×3): 1 g via INTRAVENOUS
  Filled 2015-10-09 (×3): qty 10

## 2015-10-09 MED ORDER — LOSARTAN POTASSIUM 25 MG PO TABS
25.0000 mg | ORAL_TABLET | Freq: Every day | ORAL | Status: DC
  Administered 2015-10-09 – 2015-10-10 (×2): 25 mg via ORAL
  Filled 2015-10-09 (×2): qty 1

## 2015-10-09 MED ORDER — HEPARIN SODIUM (PORCINE) 5000 UNIT/ML IJ SOLN
5000.0000 [IU] | Freq: Three times a day (TID) | INTRAMUSCULAR | Status: DC
  Administered 2015-10-09: 5000 [IU] via SUBCUTANEOUS
  Filled 2015-10-09 (×5): qty 1

## 2015-10-09 MED ORDER — QUETIAPINE FUMARATE 200 MG PO TABS
600.0000 mg | ORAL_TABLET | Freq: Every day | ORAL | Status: DC
  Administered 2015-10-09 – 2015-10-12 (×4): 600 mg via ORAL
  Filled 2015-10-09 (×4): qty 3

## 2015-10-09 MED ORDER — CARBAMAZEPINE 200 MG PO TABS
200.0000 mg | ORAL_TABLET | Freq: Two times a day (BID) | ORAL | Status: DC
  Administered 2015-10-09 – 2015-10-13 (×8): 200 mg via ORAL
  Filled 2015-10-09 (×8): qty 1

## 2015-10-09 MED ORDER — METAXALONE 800 MG PO TABS
400.0000 mg | ORAL_TABLET | Freq: Two times a day (BID) | ORAL | Status: AC
  Administered 2015-10-09 – 2015-10-10 (×4): 400 mg via ORAL
  Filled 2015-10-09: qty 0.5
  Filled 2015-10-09 (×2): qty 1
  Filled 2015-10-09: qty 0.5

## 2015-10-09 MED ORDER — DEXTROSE 5 % IV SOLN
500.0000 mg | INTRAVENOUS | Status: DC
  Administered 2015-10-09 – 2015-10-10 (×2): 500 mg via INTRAVENOUS
  Filled 2015-10-09 (×2): qty 500

## 2015-10-09 MED ORDER — LORAZEPAM 2 MG/ML IJ SOLN
INTRAMUSCULAR | Status: AC
  Administered 2015-10-09: 1 mg via INTRAVENOUS
  Filled 2015-10-09: qty 1

## 2015-10-09 MED ORDER — FUROSEMIDE 10 MG/ML IJ SOLN
20.0000 mg | Freq: Two times a day (BID) | INTRAMUSCULAR | Status: DC
  Filled 2015-10-09: qty 2

## 2015-10-09 MED ORDER — HYDROCOD POLST-CPM POLST ER 10-8 MG/5ML PO SUER
5.0000 mL | Freq: Once | ORAL | Status: AC
  Administered 2015-10-09: 5 mL via ORAL
  Filled 2015-10-09: qty 5

## 2015-10-09 MED ORDER — CLONAZEPAM 1 MG PO TABS
1.0000 mg | ORAL_TABLET | Freq: Two times a day (BID) | ORAL | Status: DC
  Administered 2015-10-09 – 2015-10-13 (×8): 1 mg via ORAL
  Filled 2015-10-09 (×8): qty 1

## 2015-10-09 MED ORDER — TIOTROPIUM BROMIDE MONOHYDRATE 18 MCG IN CAPS
18.0000 ug | ORAL_CAPSULE | Freq: Every day | RESPIRATORY_TRACT | Status: DC
  Administered 2015-10-10 – 2015-10-11 (×2): 18 ug via RESPIRATORY_TRACT
  Filled 2015-10-09: qty 5

## 2015-10-09 MED ORDER — IPRATROPIUM-ALBUTEROL 0.5-2.5 (3) MG/3ML IN SOLN
RESPIRATORY_TRACT | Status: AC
  Administered 2015-10-09: 3 mL via RESPIRATORY_TRACT
  Filled 2015-10-09: qty 3

## 2015-10-09 MED ORDER — ONDANSETRON HCL 4 MG/2ML IJ SOLN: 4.0000 mg | Freq: Four times a day (QID) | INTRAMUSCULAR | Status: DC | PRN

## 2015-10-09 MED ORDER — METOPROLOL TARTRATE 25 MG PO TABS
ORAL_TABLET | ORAL | Status: AC
  Administered 2015-10-09: 10:00:00
  Filled 2015-10-09: qty 1

## 2015-10-09 MED ORDER — INSULIN ASPART 100 UNIT/ML ~~LOC~~ SOLN: 0.0000 [IU] | Freq: Every day | SUBCUTANEOUS | Status: DC

## 2015-10-09 MED ORDER — METHYLPREDNISOLONE SODIUM SUCC 125 MG IJ SOLR
125.0000 mg | Freq: Once | INTRAMUSCULAR | Status: AC
  Administered 2015-10-09: 125 mg via INTRAVENOUS

## 2015-10-09 MED ORDER — QUETIAPINE FUMARATE 100 MG PO TABS
100.0000 mg | ORAL_TABLET | Freq: Every day | ORAL | Status: DC
  Administered 2015-10-09 – 2015-10-13 (×4): 100 mg via ORAL
  Filled 2015-10-09 (×7): qty 1

## 2015-10-09 MED ORDER — IPRATROPIUM-ALBUTEROL 0.5-2.5 (3) MG/3ML IN SOLN
3.0000 mL | Freq: Three times a day (TID) | RESPIRATORY_TRACT | Status: DC
  Administered 2015-10-10 – 2015-10-13 (×10): 3 mL via RESPIRATORY_TRACT
  Filled 2015-10-09 (×10): qty 3

## 2015-10-09 MED ORDER — METOPROLOL TARTRATE 25 MG PO TABS
25.0000 mg | ORAL_TABLET | Freq: Two times a day (BID) | ORAL | Status: DC
  Administered 2015-10-09 – 2015-10-10 (×3): 25 mg via ORAL
  Filled 2015-10-09 (×3): qty 1

## 2015-10-09 MED ORDER — SODIUM CHLORIDE 0.9% FLUSH
3.0000 mL | Freq: Two times a day (BID) | INTRAVENOUS | Status: DC
  Administered 2015-10-09 – 2015-10-13 (×8): 3 mL via INTRAVENOUS

## 2015-10-09 MED ORDER — THIOTHIXENE 10 MG PO CAPS
10.0000 mg | ORAL_CAPSULE | Freq: Two times a day (BID) | ORAL | Status: DC
  Administered 2015-10-09 – 2015-10-13 (×8): 10 mg via ORAL
  Filled 2015-10-09 (×9): qty 1

## 2015-10-09 MED ORDER — ONDANSETRON HCL 4 MG PO TABS: 4.0000 mg | ORAL_TABLET | Freq: Four times a day (QID) | ORAL | Status: DC | PRN

## 2015-10-09 MED ORDER — IPRATROPIUM-ALBUTEROL 0.5-2.5 (3) MG/3ML IN SOLN
3.0000 mL | RESPIRATORY_TRACT | Status: DC
  Administered 2015-10-09 (×4): 3 mL via RESPIRATORY_TRACT
  Filled 2015-10-09 (×4): qty 3

## 2015-10-09 MED ORDER — LORAZEPAM 2 MG/ML IJ SOLN
1.0000 mg | Freq: Once | INTRAMUSCULAR | Status: AC
  Administered 2015-10-09: 1 mg via INTRAVENOUS

## 2015-10-09 MED ORDER — METHYLPREDNISOLONE SODIUM SUCC 125 MG IJ SOLR
INTRAMUSCULAR | Status: AC
  Administered 2015-10-09: 125 mg via INTRAVENOUS
  Filled 2015-10-09: qty 2

## 2015-10-09 MED ORDER — ACETAMINOPHEN 325 MG PO TABS
650.0000 mg | ORAL_TABLET | Freq: Four times a day (QID) | ORAL | Status: DC | PRN
  Administered 2015-10-10: 650 mg via ORAL
  Filled 2015-10-09: qty 2

## 2015-10-09 MED ORDER — FUROSEMIDE 10 MG/ML IJ SOLN
20.0000 mg | Freq: Once | INTRAMUSCULAR | Status: AC
  Administered 2015-10-09: 20 mg via INTRAVENOUS
  Filled 2015-10-09: qty 4

## 2015-10-09 NOTE — ED Notes (Signed)
Dr sung notified of troponin 0.05

## 2015-10-09 NOTE — Progress Notes (Signed)
[facility]  ROUNDING NOTE   Subjective:  Patient well known to us from the office. He has diagnosis of FSGS with chronic kidney disease. He was seen in the office on 04/19/15. At that point in time EGFR was 55 with urine protein to creatine ratio of 4.9. He presents to the emergency department now with respiratory distress. He also appears to have a diagnosis of interstitial lung disease which has not been fully explored.   Objective:  Vital signs in last 24 hours:  Temp:  [97.7 F (36.5 C)-100.5 F (38.1 C)] 100.5 F (38.1 C) (07/29 0836) Pulse Rate:  [122-140] 122 (07/29 0836) Resp:  [21-64] 35 (07/29 0836) BP: (128-156)/(88-107) 128/88 (07/29 0836) SpO2:  [87 %-100 %] 100 % (07/29 0836) Weight:  [95.3 kg (210 lb)] 95.3 kg (210 lb) (07/29 0556)  Weight change:  Filed Weights   10/09/15 0556  Weight: 95.3 kg (210 lb)    Intake/Output: No intake/output data recorded.   Intake/Output this shift:  No intake/output data recorded.  Physical Exam: General: Sitting up in bed, tachypneic  Head: Normocephalic, atraumatic. Moist oral mucosal membranes  Eyes: Anicteric  Neck: Supple, trachea midline  Lungs:  Bilateral rhonchi, tachypnea noted  Heart: S1S2 no rubs  Abdomen:  Soft, nontender, BS present  Extremities: trace peripheral edema.  Neurologic: Nonfocal, moving all four extremities  Skin: No lesions  Access:     Basic Metabolic Panel:  Recent Labs Lab 10/09/15 0617  NA 137  K 4.5  CL 105  CO2 23  GLUCOSE 107*  BUN 24*  CREATININE 2.02*  CALCIUM 8.9    Liver Function Tests:  Recent Labs Lab 10/09/15 0617  AST 25  ALT 10*  ALKPHOS 60  BILITOT 0.6  PROT 8.8*  ALBUMIN 3.2*   No results for input(s): LIPASE, AMYLASE in the last 168 hours. No results for input(s): AMMONIA in the last 168 hours.  CBC:  Recent Labs Lab 10/09/15 0617  WBC 11.2*  NEUTROABS 9.2*  HGB 12.2*  HCT 38.5*  MCV 69.3*  PLT 356    Cardiac  Enzymes:  Recent Labs Lab 10/09/15 0617  TROPONINI 0.05*    BNP: Invalid input(s): POCBNP  CBG: No results for input(s): GLUCAP in the last 168 hours.  Microbiology: Results for orders placed or performed during the hospital encounter of 08/17/15  Culture, blood (routine x 2)     Status: None   Collection Time: 08/17/15  6:46 AM  Result Value Ref Range Status   Specimen Description BLOOD LEFT FOREARM  Final   Special Requests   Final    BOTTLES DRAWN AEROBIC AND ANAEROBIC AER 8ML ANA 2ML   Culture NO GROWTH 8 DAYS  Final   Report Status 08/25/2015 FINAL  Final  Urine culture     Status: None   Collection Time: 08/17/15  6:46 AM  Result Value Ref Range Status   Specimen Description URINE, RANDOM  Final   Special Requests Normal  Final   Culture NO GROWTH Performed at [facility]   Final   Report Status 08/18/2015 FINAL  Final  Culture, blood (routine x 2)     Status: None   Collection Time: 08/17/15  6:48 AM  Result Value Ref Range Status   Specimen Description BLOOD RIGHT AC  Final   Special Requests BOTTLES DRAWN AEROBIC AND ANAEROBIC  18ML  Final   Culture NO GROWTH 8 DAYS  Final   Report Status 08/25/2015 FINAL  Final      Coagulation Studies: No results for input(s): LABPROT, INR in the last 72 hours.  Urinalysis: No results for input(s): COLORURINE, LABSPEC, PHURINE, GLUCOSEU, HGBUR, BILIRUBINUR, KETONESUR, PROTEINUR, UROBILINOGEN, NITRITE, LEUKOCYTESUR in the last 72 hours.  Invalid input(s): APPERANCEUR    Imaging: Dg Chest Port 1 View  Result Date: 10/09/2015 CLINICAL DATA:  Breathing difficulty, shortness of Breath EXAM: PORTABLE CHEST 1 VIEW COMPARISON:  08/17/2015 FINDINGS: Low lung volumes with bibasilar atelectasis. Vascular congestion additional prominence, similar to prior study. No definite effusions or acute bony abnormality. IMPRESSION: Very low lung volumes with bibasilar atelectasis and stable diffuse interstitial prominence.  Electronically Signed   By: Kevin  Dover M.D.   On: 10/09/2015 07:14    Medications:     . azithromycin  500 mg Intravenous Q24H  . benzonatate  100 mg Oral TID  . cefTRIAXone (ROCEPHIN)  IV  1 g Intravenous Q24H  . clonazePAM  1 mg Oral BID  . insulin aspart  0-5 Units Subcutaneous QHS  . insulin aspart  0-9 Units Subcutaneous TID WC  . ipratropium-albuterol  3 mL Nebulization Q4H  . metaxalone  400 mg Oral BID  . metoprolol tartrate  25 mg Oral BID     Assessment/ Plan:  30 y.o.African American male with history of bipolar disorder, ADHD, history of smoking,chronic kidney disease presents for shortness of breath  1. 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 The patient has had mild fluctuations in renal function recently. His baseline creatinine is 1.9. Creatinine today is 2.02. The patient was previously on losartan 25 mg by mouth daily for his underlying proteinuria which we will restart. We will also check urine protein to creatinine ratio. Treatment of his shortness of breath and interstitial lung disease as per pulmonary as well as hospitalist.  2. Thanks for consultation.   LOS: 0 ,  7/29/201711:19 AM   

## 2015-10-09 NOTE — Consult Note (Signed)
[facility] Consultation      Assessment and Plan:  Acute respiratory failure, likely secondary to acute bronchitis.- --agree with current management on ceftriaxone and azithromycin IV. -continue Spiriva, when necessary albuterol. -advance activity, wean down oxygen as tolerated.  Chronic respiratory failure secondary to interstitial lung disease. -discussed with mother on previous admission as well as outpatient visit, given his other comorbidities, we have opted not to proceed with that any invasive workup of his interstitial lung disease but rather treat conservatively.  Developmental disability/autism. -as above. This complicates therapy given the patient's inability to understand many therapies instructions and interventions.  History of congestive heart failure. -treatment is indicated,we'll monitor as this likely exacerbates the patient's respiratory status.  Date: 10/09/2015  MRN# [id number] Walter Wong Dec 17, 1985  Referring Physician: Dr. Nemiah Commander.   Walter Wong is a 30 y.o. old male seen in consultation for chief complaint of:    Chief Complaint  Patient presents with  . Respiratory Distress    Pt. here via EMS from home for respiratory distress.    HPI:   The patient is a 30 year old male with a history of developmental disability, and autism. The patient has a history of COPD and interstitial lung disease. We have seen him in the past inpatient as well as in the outpatient setting. He also has bronchiectasis changes related to interstitial lung disease. The etiology of his lung disease is uncertain, given his developmental disability, and likely poor tolerance for any procedures, in consultation with his mother decided not to opt for any interventional diagnostic procedures or aggressive therapies. He also has a history of FSGS with CK D stage III, ADHD, schizophrenia and bipolar disorder, diabetes mellitus not on insulin, diastolic congestive  heart failure.   Currently, the patient is very sleepy therefore, all history is obtained from the chart and from staff. Presents to the hospital with complaints ofproductive cough, chills for the last 3-4 days.  Review of chest x-ray films from 7/29shows continued severe interstitial changes. Review of blood gases 7/29: 7.39/39/85/23.6, consistent with compensated respiratory failure.   PMHX:   Past Medical History:  Diagnosis Date  . ADHD (attention deficit hyperactivity disorder)   . Bipolar disorder (HCC)   . CHF (congestive heart failure) (HCC)    Diastolic CHF  . Chronic kidney disease    Stage 3  . Diabetes mellitus, type II (HCC)   . Interstitial lung disease (HCC)    on nocturnal home o2  . Schizophrenia [facility])    Surgical Hx:  Past Surgical History:  Procedure Laterality Date  . DENTAL SURGERY     Family Hx:  Family History  Problem Relation Age of Onset  . Kidney failure Mother   . Congestive Heart Failure Mother    Social Hx:   Social History  Substance Use Topics  . Smoking status: Never Smoker  . Smokeless tobacco: Never Used  . Alcohol use No   Medication:    Reviewed.    Allergies:  Review of patient's allergies indicates no known allergies.  Review of Systems: Could not be obtained due to lethargy and fatigue.  Physical Examination:   VS: BP 126/89 (BP Location: Left Arm)   Pulse 79   Temp 98.5 F (36.9 C) (Oral)   Resp (!) 26   Ht 5\' 6"  (1.676 m)   Wt 210 lb (95.3 kg)   SpO2 100%   BMI 33.89 kg/m   General Appearance: No distress  Neuro:without focal findings,  speech  normal,  HEENT: PERRLA, EOM intact.   Pulmonary: bilateral wheezing and crepitations heard diffusely. CardiovascularNormal S1,S2.  No m/r/g.   Abdomen: Benign, Soft, non-tender. Renal:  No costovertebral tenderness  GU:  No performed at this time. Endoc: No evident thyromegaly, no signs of acromegaly. Skin:   warm, no rashes, no ecchymosis  Extremities: normal, no  cyanosis, clubbing.  Other findings:    LABORATORY PANEL:   CBC  Recent Labs Lab 10/09/15 0617  WBC 11.2*  HGB 12.2*  HCT 38.5*  PLT 356   ------------------------------------------------------------------------------------------------------------------  Chemistries   Recent Labs Lab 10/09/15 0617  NA 137  K 4.5  CL 105  CO2 23  GLUCOSE 107*  BUN 24*  CREATININE 2.02*  CALCIUM 8.9  AST 25  ALT 10*  ALKPHOS 60  BILITOT 0.6   ------------------------------------------------------------------------------------------------------------------  Cardiac Enzymes  Recent Labs Lab 10/09/15 0617  TROPONINI 0.05*   ------------------------------------------------------------  RADIOLOGY:  Dg Chest Port 1 View  Result Date: 10/09/2015 CLINICAL DATA:  Breathing difficulty, shortness of Breath EXAM: PORTABLE CHEST 1 VIEW COMPARISON:  08/17/2015 FINDINGS: Low lung volumes with bibasilar atelectasis. Vascular congestion additional prominence, similar to prior study. No definite effusions or acute bony abnormality. IMPRESSION: Very low lung volumes with bibasilar atelectasis and stable diffuse interstitial prominence. Electronically Signed   By: Charlett Nose M.D.   On: 10/09/2015 07:14      Thank  you for the consultation and for allowing [facility], [facility] to assist in the care of your patient. Our recommendations are noted above.  Please contact us if we can be of further service.   Wells Guiles, MD.  Board Certified in Internal Medicine, Pulmonary Medicine, Critical Care Medicine, and Sleep Medicine.  [facility] Office Number: [phone]  Santiago Glad, M.D.  Stephanie Acre, M.D.  Billy Fischer, M.D  10/09/2015

## 2015-10-09 NOTE — Care Management Important Message (Signed)
Important Message  Patient Details  Name: Walter Wong MRN: [id number] Date of Birth: 04-16-1985   Medicare Important Message Given:  Yes    Thomasine Klutts A, RN 10/09/2015, 3:28 PM

## 2015-10-09 NOTE — Progress Notes (Signed)
Pharmacy Antibiotic Note  Walter Wong is a 30 y.o. male admitted on 10/09/2015 with bronchitis.  Pharmacy has been consulted for ceftriaxone dosing.  Plan: Ceftriaxone 1 g IV q24h. Pt also on azithromycin.   Height: 5\' 6"  (167.6 cm) Weight: 210 lb (95.3 kg) IBW/kg (Calculated) : 63.8  Temp (24hrs), Avg:99.1 F (37.3 C), Min:97.7 F (36.5 C), Max:100.5 F (38.1 C)   Recent Labs Lab 10/09/15 0617  WBC 11.2*  CREATININE 2.02*    Estimated Creatinine Clearance: 58.3 mL/min (by C-G formula based on SCr of 2.02 mg/dL).    No Known Allergies  Antimicrobials this admission:   Dose adjustments this admission:   Microbiology results:   Thank you for allowing pharmacy to be a part of this patient's care.  Marty Heck 10/09/2015 9:12 AM

## 2015-10-09 NOTE — ED Triage Notes (Signed)
Pt. Is an autistic pt. Coming from home for breathing difficulty.  Pt. Mother stated to EMS the pt. Has COPD and dx with pneumonia last month. Mother stated she wanted pt. Checked for possible UTI.  Pt. Is on 3 L home O2.

## 2015-10-09 NOTE — ED Provider Notes (Signed)
[facility] Emergency Department Provider Note   ____________________________________________   First MD Initiated Contact with Patient 10/09/15 [phone]     (approximate)  I have reviewed the triage vital signs and the nursing notes.   HISTORY  Chief Complaint Respiratory Distress (Pt. here via EMS from home for respiratory distress.)    HPI Walter Wong is a 30 y.o. male who presents to the ED from home via EMS with a chief complaint of respiratory distress.Patient has a history of bipolar disorder, CHF, interstitial lung disease who was recently placed on nighttime 3L oxygen. Complains of breathing difficulty and chest tightness for one day. Complains of nonproductive cough and progressive shortness of breath. Reportedly mother wanted patient to be checked for possible UTI as well. Patient states he has been taking AZO for dysuria. Patient denies recent fever, chills, abdominal pain, nausea, vomiting, diarrhea. Denies recent travel or trauma. Nothing makes his symptoms better or worse.   Past Medical History:  Diagnosis Date  . ADHD (attention deficit hyperactivity disorder)   . Bipolar disorder (HCC)   . CHF (congestive heart failure) (HCC)   . Chronic kidney disease   . Diabetes mellitus, type II (HCC)   . Interstitial lung disease (HCC)   . Schizophrenia [facility])     Patient Active Problem List   Diagnosis Date Noted  . Schizoaffective disorder (HCC) 08/19/2015  . Sepsis (HCC) 08/17/2015  . Acute on chronic diastolic heart failure (HCC) 02/19/2015  . CHF (congestive heart failure) (HCC) 01/22/2015  . Heart failure (HCC) 01/22/2015  . Bipolar 1 disorder, mixed, moderate (HCC) 11/12/2014  . Bipolar disorder, current episode mixed, moderate (HCC) 11/12/2014    Past Surgical History:  Procedure Laterality Date  . DENTAL SURGERY      Prior to Admission medications   Medication Sig Start Date End Date Taking? Authorizing Provider  carbamazepine  (TEGRETOL) 200 MG tablet Take 1 tablet (200 mg total) by mouth 2 (two) times daily. 05/13/15   Audery Amel, MD  cefUROXime (CEFTIN) 500 MG tablet Take 1 tablet (500 mg total) by mouth 2 (two) times daily with a meal. 08/20/15   Enedina Finner, MD  furosemide (LASIX) 20 MG tablet Take 1 tablet (20 mg total) by mouth 2 (two) times daily. 01/24/15   Katha Hamming, MD  losartan (COZAAR) 25 MG tablet Take 1 tablet (25 mg total) by mouth at bedtime. 08/20/15   Enedina Finner, MD  QUEtiapine (SEROQUEL) 100 MG tablet Take 1 tablet (100 mg total) by mouth daily. 05/13/15   Audery Amel, MD  QUEtiapine (SEROQUEL) 300 MG tablet Take 2 tablets (600 mg total) by mouth at bedtime. 05/13/15   Audery Amel, MD  thiothixene (NAVANE) 10 MG capsule Take 1 capsule (10 mg total) by mouth 2 (two) times daily. 05/13/15   Audery Amel, MD  tiotropium (SPIRIVA HANDIHALER) 18 MCG inhalation capsule Place 1 capsule (18 mcg total) into inhaler and inhale daily. 08/30/15 08/29/16  Shane Crutch, MD    Allergies Review of patient's allergies indicates no known allergies.  Family History  Problem Relation Age of Onset  . Kidney failure Mother   . Congestive Heart Failure Mother     Social History Social History  Substance Use Topics  . Smoking status: Never Smoker  . Smokeless tobacco: Never Used  . Alcohol use No    Review of Systems  Constitutional: No fever/chills. Eyes: No visual changes. ENT: No sore throat. Cardiovascular: Positive for chest pain. Respiratory:  Positive for shortness of breath. Gastrointestinal: No abdominal pain.  No nausea, no vomiting.  No diarrhea.  No constipation. Genitourinary: Negative for dysuria. Musculoskeletal: Negative for back pain. Skin: Negative for rash. Neurological: Negative for headaches, focal weakness or numbness.  10-point ROS otherwise negative.  ____________________________________________   PHYSICAL EXAM:  VITAL SIGNS: ED Triage Vitals  Enc Vitals  Group     BP      Pulse      Resp      Temp      Temp src      SpO2      Weight      Height      Head Circumference      Peak Flow      Pain Score      Pain Loc      Pain Edu?      Excl. in GC?     Constitutional: Alert and oriented. Well appearing and in moderate acute distress. Eyes: Conjunctivae are normal. PERRL. EOMI. Head: Atraumatic. Nose: No congestion/rhinnorhea. Mouth/Throat: Mucous membranes are moist.  Oropharynx non-erythematous. Neck: No stridor.   Cardiovascular: Normal rate, regular rhythm. Grossly normal heart sounds.  Good peripheral circulation. Trunk appears edematous. Respiratory: Increased respiratory effort.  Retractions. Lungs diminished bilaterally. Gastrointestinal: Soft and nontender. No distention. No abdominal bruits. No CVA tenderness. Musculoskeletal: No lower extremity tenderness nor edema.  No joint effusions. Neurologic:  Normal speech and language. No gross focal neurologic deficits are appreciated. No gait instability. Skin:  Skin is warm, dry and intact. No rash noted. Psychiatric: Mood and affect are normal. Speech and behavior are normal.  ____________________________________________   LABS (all labs ordered are listed, but only abnormal results are displayed)  Labs Reviewed  CBC WITH DIFFERENTIAL/PLATELET - Abnormal; Notable for the following:       Result Value   WBC 11.2 (*)    Hemoglobin 12.2 (*)    HCT 38.5 (*)    MCV 69.3 (*)    MCH 22.0 (*)    MCHC 31.7 (*)    RDW 15.8 (*)    Neutro Abs 9.2 (*)    All other components within normal limits  COMPREHENSIVE METABOLIC PANEL - Abnormal; Notable for the following:    Glucose, Bld 107 (*)    BUN 24 (*)    Creatinine, Ser 2.02 (*)    Total Protein 8.8 (*)    Albumin 3.2 (*)    ALT 10 (*)    GFR calc non Af Amer 43 (*)    GFR calc Af Amer 50 (*)    All other components within normal limits  TROPONIN I - Abnormal; Notable for the following:    Troponin I 0.05 (*)    All  other components within normal limits  BRAIN NATRIURETIC PEPTIDE  URINALYSIS COMPLETEWITH MICROSCOPIC ([facility] ONLY)   ____________________________________________  EKG  ED ECG REPORT I, SUNG,JADE J, the attending physician, personally viewed and interpreted this ECG.   Date: 10/09/2015  EKG Time: 0621  Rate: 123  Rhythm: sinus tachycardia  Axis: Normal  Intervals:none  ST&T Change: Nonspecific  ____________________________________________  RADIOLOGY  Portable chest x-ray (viewed by me): Consistent with congestive heart failure ____________________________________________   PROCEDURES  Procedure(s) performed: None  Procedures  Critical Care performed: Yes, see critical care note(s)   CRITICAL CARE Performed by: Irean Hong   Total critical care time: 30 minutes  Critical care time was exclusive of separately billable procedures and treating other patients.  Critical care was  necessary to treat or prevent imminent or life-threatening deterioration.  Critical care was time spent personally by me on the following activities: development of treatment plan with patient and/or surrogate as well as nursing, discussions with consultants, evaluation of patient's response to treatment, examination of patient, obtaining history from patient or surrogate, ordering and performing treatments and interventions, ordering and review of laboratory studies, ordering and review of radiographic studies, pulse oximetry and re-evaluation of patient's condition.  ____________________________________________   INITIAL IMPRESSION / ASSESSMENT AND PLAN / ED COURSE  Pertinent labs & imaging results that were available during my care of the patient were reviewed by me and considered in my medical decision making (see chart for details).  30 year old male with interstitial lung disease, CHF who presents in respiratory distress. Lungs are diminished bilaterally. Will initiate DuoNeb treatment,  IV Solu-Medrol, obtain screening lab work, chest x-ray and reassess. Anticipate hospital admission.  Clinical Course  Comment By Time  Better aeration after nebulizer treatment. Patient is anxious with rapid breathing. When instructed, he is able to slow his breathing. Will administer low-dose Ativan for calming effect. Discussed with hospitalist to evaluate patient in the emergency department for admission. Viewed chest x-ray; will add IV Lasix for diuresis. Irean Hong, MD 07/29 [phone]     ____________________________________________   FINAL CLINICAL IMPRESSION(S) / ED DIAGNOSES  Final diagnoses:  Respiratory distress  Acute on chronic congestive heart failure, unspecified congestive heart failure type (HCC)  Shortness of breath      NEW MEDICATIONS STARTED DURING THIS VISIT:  New Prescriptions   No medications on file     Note:  This document was prepared using Dragon voice recognition software and may include unintentional dictation errors.    Irean Hong, MD 10/09/15 [phone]

## 2015-10-09 NOTE — Progress Notes (Signed)
*  PRELIMINARY RESULTS* Echocardiogram 2D Echocardiogram has been performed.  Walter Wong 10/09/2015, 11:23 AM

## 2015-10-09 NOTE — H&P (Signed)
[facility] - [place] at [facility]   PATIENT NAME: Walter Wong    MR#:  [id number]  DATE OF BIRTH:  04-Jul-1985  DATE OF ADMISSION:  10/09/2015  PRIMARY CARE PHYSICIAN: Mickel Fuchs, MD   REQUESTING/REFERRING PHYSICIAN: Dr. Chiquita Loth  CHIEF COMPLAINT:   Chief Complaint  Patient presents with  . Respiratory Distress    Pt. here via EMS from home for respiratory distress.    HISTORY OF PRESENT ILLNESS:  Walter Wong  is a 30 y.o. male with a known history of FSGS with CK D stage III, ADHD, schizophrenia and bipolar disorder, diabetes mellitus not on insulin, recent diagnosis of interstitial lung disease, diastolic congestive heart failure presents to the hospital from home secondary to worsening shortness of breath. Patient was admitted here last month with similar complaints and was treated for pneumonia. He has seen pulmonary as an outpatient in the office after discharge and is diagnosed to have possible interstitial lung disease. No lung biopsy was attempted given his underlying other issues. He said he has been feeling fine up until last couple of days when his breathing got worse. He complains of productive cough, chills, no fevers. No nausea or vomiting or diarrhea. Respiratory tachypneic but able to speak in full sentences. He uses only nocturnal home oxygen, currently requiring 3 L oxygen. Chest x-ray here in the emergency room reveals interstitial markings, could be from CHF and also possible interstitial lung disease.  PAST MEDICAL HISTORY:   Past Medical History:  Diagnosis Date  . ADHD (attention deficit hyperactivity disorder)   . Bipolar disorder (HCC)   . CHF (congestive heart failure) (HCC)    Diastolic CHF  . Chronic kidney disease    Stage 3  . Diabetes mellitus, type II (HCC)   . Interstitial lung disease (HCC)    on nocturnal home o2  . Schizophrenia (HCC)     PAST SURGICAL HISTORY:   Past Surgical History:  Procedure  Laterality Date  . DENTAL SURGERY      SOCIAL HISTORY:   Social History  Substance Use Topics  . Smoking status: Never Smoker  . Smokeless tobacco: Never Used  . Alcohol use No    FAMILY HISTORY:   Family History  Problem Relation Age of Onset  . Kidney failure Mother   . Congestive Heart Failure Mother     DRUG ALLERGIES:  No Known Allergies  REVIEW OF SYSTEMS:   Review of Systems  Constitutional: Negative for chills, fever, malaise/fatigue and weight loss.  HENT: Negative for ear discharge, ear pain, hearing loss and nosebleeds.   Eyes: Negative for blurred vision, double vision and photophobia.  Respiratory: Positive for cough and shortness of breath. Negative for hemoptysis and wheezing.   Cardiovascular: Negative for chest pain, palpitations, orthopnea and leg swelling.  Gastrointestinal: Negative for abdominal pain, constipation, diarrhea, melena, nausea and vomiting.  Genitourinary: Negative for dysuria, frequency, hematuria and urgency.  Musculoskeletal: Positive for myalgias. Negative for back pain and neck pain.  Skin: Negative for rash.  Neurological: Negative for dizziness, tremors, sensory change, speech change, focal weakness and headaches.  Endo/Heme/Allergies: Does not bruise/bleed easily.  Psychiatric/Behavioral: Negative for depression. The patient is nervous/anxious.     MEDICATIONS AT HOME:   Prior to Admission medications   Medication Sig Start Date End Date Taking? Authorizing Provider  carbamazepine (TEGRETOL) 200 MG tablet Take 1 tablet (200 mg total) by mouth 2 (two) times daily. 05/13/15  Yes Audery Amel, MD  furosemide (  LASIX) 20 MG tablet Take 1 tablet (20 mg total) by mouth 2 (two) times daily. 01/24/15  Yes Katha Hamming, MD  QUEtiapine (SEROQUEL) 100 MG tablet Take 1 tablet (100 mg total) by mouth daily. 05/13/15  Yes Audery Amel, MD  QUEtiapine (SEROQUEL) 300 MG tablet Take 2 tablets (600 mg total) by mouth at bedtime. 05/13/15   Yes Audery Amel, MD  thiothixene (NAVANE) 10 MG capsule Take 1 capsule (10 mg total) by mouth 2 (two) times daily. 05/13/15  Yes Audery Amel, MD  tiotropium (SPIRIVA HANDIHALER) 18 MCG inhalation capsule Place 1 capsule (18 mcg total) into inhaler and inhale daily. 08/30/15 08/29/16 Yes Shane Crutch, MD  losartan (COZAAR) 25 MG tablet Take 1 tablet (25 mg total) by mouth at bedtime. 08/20/15   Enedina Finner, MD      VITAL SIGNS:  Blood pressure 126/89, pulse 79, temperature 98.5 F (36.9 C), temperature source Oral, resp. rate (!) 26, height 5\' 6"  (1.676 m), weight 95.3 kg (210 lb), SpO2 100 %.  PHYSICAL EXAMINATION:   Physical Exam  GENERAL:  30 y.o.-year-old patient lying in the bed with no acute distress. Tachypneic, but able to speak in full sentences EYES: Pupils equal, round, reactive to light and accommodation. No scleral icterus. Extraocular muscles intact.  HEENT: Head atraumatic, normocephalic. Oropharynx and nasopharynx clear.  NECK:  Supple, no jugular venous distention. No thyroid enlargement, no tenderness.  LUNGS: Normal breath sounds bilaterally, no wheezing, rales,rhonchi or crepitation. No use of accessory muscles of respiration. Fine bibasilar crackles CARDIOVASCULAR: S1, S2 normal. No murmurs, rubs, or gallops.  ABDOMEN: Soft, nontender, nondistended. Bowel sounds present. No organomegaly or mass.  EXTREMITIES: No pedal edema, cyanosis, or clubbing.  NEUROLOGIC: Cranial nerves II through XII are intact. Muscle strength 5/5 in all extremities. Sensation intact. Gait not checked.  PSYCHIATRIC: The patient is alert and oriented x 3.  SKIN: No obvious rash, lesion, or ulcer.   LABORATORY PANEL:   CBC  Recent Labs Lab 10/09/15 0617  WBC 11.2*  HGB 12.2*  HCT 38.5*  PLT 356   ------------------------------------------------------------------------------------------------------------------  Chemistries   Recent Labs Lab 10/09/15 0617  NA 137  K 4.5   CL 105  CO2 23  GLUCOSE 107*  BUN 24*  CREATININE 2.02*  CALCIUM 8.9  AST 25  ALT 10*  ALKPHOS 60  BILITOT 0.6   ------------------------------------------------------------------------------------------------------------------  Cardiac Enzymes  Recent Labs Lab 10/09/15 0617  TROPONINI 0.05*   ------------------------------------------------------------------------------------------------------------------  RADIOLOGY:  Dg Chest Port 1 View  Result Date: 10/09/2015 CLINICAL DATA:  Breathing difficulty, shortness of Breath EXAM: PORTABLE CHEST 1 VIEW COMPARISON:  08/17/2015 FINDINGS: Low lung volumes with bibasilar atelectasis. Vascular congestion additional prominence, similar to prior study. No definite effusions or acute bony abnormality. IMPRESSION: Very low lung volumes with bibasilar atelectasis and stable diffuse interstitial prominence. Electronically Signed   By: Charlett Nose M.D.   On: 10/09/2015 07:14   EKG:   Orders placed or performed during the hospital encounter of 10/09/15  . EKG 12-Lead  . EKG 12-Lead    IMPRESSION AND PLAN:   Walter Wong  is a 30 y.o. male with a known history of FSGS with CK D stage III, ADHD, schizophrenia and bipolar disorder, diabetes mellitus not on insulin, recent diagnosis of interstitial lung disease, diastolic congestive heart failure presents to the hospital from home secondary to worsening shortness of breath.  #1 acute hypoxic respiratory failure-secondary to acute on chronic diastolic CHF exacerbation. -IV Lasix twice a  day started. -Also has underlying interstitial lung disease. -No wheezing on exam. Pulmonary consulted. -Check echocardiogram. -Rocephin and azithromycin for bronchitis symptoms.  #2 acute renal failure on CK D stage III-baseline creatinine around 1.9 -Nephrology consulted. History of chronic lithium usage in the past. -Continue losartan low-dose for proteinuria. -Monitor while on Lasix.  #3  hypertension-on losartan and metoprolol.  #4 schizoaffective disorder-stable. Continue Navane, Seroquel and Tegretol. -Klonopin added for anxiety  #5 DVT prophylaxis-on a subcutaneous heparin    All the records are reviewed and case discussed with ED provider. Management plans discussed with the patient, family and they are in agreement.  CODE STATUS: Full Code  TOTAL TIME TAKING CARE OF THIS PATIENT: 50 minutes.    Enid Baas M.D on 10/09/2015 at 12:15 PM  Between 7am to 6pm - Pager - [phone]  After 6pm go to [email or web address] - password [email or web address] [facility]  [place] [facility]  Office  [phone]  CC: Primary care physician; Mickel Fuchs, MD

## 2015-10-10 LAB — BASIC METABOLIC PANEL
ANION GAP: 9 (ref 5–15)
BUN: 33 mg/dL — ABNORMAL HIGH (ref 6–20)
CALCIUM: 9 mg/dL (ref 8.9–10.3)
CO2: 26 mmol/L (ref 22–32)
CREATININE: 2.13 mg/dL — AB (ref 0.61–1.24)
Chloride: 100 mmol/L — ABNORMAL LOW (ref 101–111)
GFR, EST AFRICAN AMERICAN: 47 mL/min — AB (ref >60)
GFR, EST NON AFRICAN AMERICAN: 40 mL/min — AB (ref >60)
Glucose, Bld: 99 mg/dL (ref 65–99)
Potassium: 4 mmol/L (ref 3.5–5.1)
SODIUM: 135 mmol/L (ref 135–145)

## 2015-10-10 LAB — CBC
HCT: 35 % — ABNORMAL LOW (ref 40.0–52.0)
HEMOGLOBIN: 11.3 g/dL — AB (ref 13.0–18.0)
MCH: 22.2 pg — AB (ref 26.0–34.0)
MCHC: 32.2 g/dL (ref 32.0–36.0)
MCV: 69 fL — ABNORMAL LOW (ref 80.0–100.0)
PLATELETS: 324 10*3/uL (ref 150–440)
RBC: 5.07 MIL/uL (ref 4.40–5.90)
RDW: 16.2 % — ABNORMAL HIGH (ref 11.5–14.5)
WBC: 7.7 10*3/uL (ref 3.8–10.6)

## 2015-10-10 LAB — GLUCOSE, CAPILLARY
GLUCOSE-CAPILLARY: 75 mg/dL (ref 65–99)
GLUCOSE-CAPILLARY: 120 mg/dL — AB (ref 65–99)
Glucose-Capillary: 92 mg/dL (ref 65–99)
Glucose-Capillary: 187 mg/dL — ABNORMAL HIGH (ref 65–99)
Glucose-Capillary: 101 mg/dL — ABNORMAL HIGH (ref 65–99)

## 2015-10-10 MED ORDER — AZITHROMYCIN 250 MG PO TABS
500.0000 mg | ORAL_TABLET | Freq: Every day | ORAL | Status: DC
  Administered 2015-10-11: 500 mg via ORAL
  Filled 2015-10-10: qty 2

## 2015-10-10 MED ORDER — FUROSEMIDE 20 MG PO TABS
20.0000 mg | ORAL_TABLET | Freq: Every day | ORAL | Status: DC
  Filled 2015-10-10: qty 1

## 2015-10-10 MED ORDER — MORPHINE SULFATE (PF) 2 MG/ML IV SOLN
2.0000 mg | Freq: Once | INTRAVENOUS | Status: AC
  Administered 2015-10-10: 2 mg via INTRAVENOUS

## 2015-10-10 MED ORDER — MORPHINE SULFATE (PF) 2 MG/ML IV SOLN
INTRAVENOUS | Status: AC
  Filled 2015-10-10: qty 1

## 2015-10-10 NOTE — Progress Notes (Signed)
[facility] - [place] at [facility]   PATIENT NAME: Walter Wong    MR#:  [id number]  DATE OF BIRTH:  1985-04-02  SUBJECTIVE:  CHIEF COMPLAINT:   Chief Complaint  Patient presents with  . Respiratory Distress    Pt. here via EMS from home for respiratory distress.   -Breathing is improving. Patient doesn't take deep breaths and at baseline takes rapid shallow breaths. -Try to wean the oxygen. Remains on 4 L this morning  REVIEW OF SYSTEMS:  Review of Systems  Constitutional: Negative for chills, fever and malaise/fatigue.  HENT: Negative for ear discharge, ear pain and nosebleeds.   Eyes: Negative for blurred vision and double vision.  Respiratory: Positive for shortness of breath. Negative for cough and wheezing.   Cardiovascular: Negative for chest pain, palpitations and orthopnea.  Gastrointestinal: Negative for abdominal pain, constipation, diarrhea, nausea and vomiting.  Genitourinary: Negative for dysuria.  Musculoskeletal: Positive for back pain and myalgias.  Neurological: Negative for dizziness, sensory change, speech change, focal weakness, seizures and headaches.  Psychiatric/Behavioral: The patient is nervous/anxious.     DRUG ALLERGIES:  No Known Allergies  VITALS:  Blood pressure 106/65, pulse 87, temperature 97.9 F (36.6 C), temperature source Oral, resp. rate 18, height  (1.676 m), weight 95.3 kg (210 lb), SpO2 100 %.  PHYSICAL EXAMINATION:  Physical Exam  GENERAL:  30 y.o.-year-old patient lying in the bed with no acute distress. Less tachypneic today. EYES: Pupils equal, round, reactive to light and accommodation. No scleral icterus. Extraocular muscles intact.  HEENT: Head atraumatic, normocephalic. Oropharynx and nasopharynx clear.  NECK:  Supple, no jugular venous distention. No thyroid enlargement, no tenderness.  LUNGS: Normal breath sounds bilaterally, no wheezing, rhonchi or crepitation. No use of accessory muscles of  respiration. Fine bibasilar crackles. CARDIOVASCULAR: S1, S2 normal. No murmurs, rubs, or gallops.  ABDOMEN: Soft, nontender, nondistended. Bowel sounds present. No organomegaly or mass.  EXTREMITIES: No pedal edema, cyanosis, or clubbing.  NEUROLOGIC: Cranial nerves II through XII are intact. Muscle strength 5/5 in all extremities. Sensation intact. Gait not checked.  PSYCHIATRIC: The patient is alert and oriented x 3. Decreased intellectual ability. SKIN: No obvious rash, lesion, or ulcer.    LABORATORY PANEL:   CBC  Recent Labs Lab 10/10/15 0523  WBC 7.7  HGB 11.3*  HCT 35.0*  PLT 324   ------------------------------------------------------------------------------------------------------------------  Chemistries   Recent Labs Lab 10/09/15 0617 10/10/15 0523  NA 137 135  K 4.5 4.0  CL 105 100*  CO2 23 26  GLUCOSE 107* 99  BUN 24* 33*  CREATININE 2.02* 2.13*  CALCIUM 8.9 9.0  AST 25  --   ALT 10*  --   ALKPHOS 60  --   BILITOT 0.6  --    ------------------------------------------------------------------------------------------------------------------  Cardiac Enzymes  Recent Labs Lab 10/09/15 2316  TROPONINI 0.03*   ------------------------------------------------------------------------------------------------------------------  RADIOLOGY:  Dg Chest Port 1 View  Result Date: 10/09/2015 CLINICAL DATA:  Breathing difficulty, shortness of Breath EXAM: PORTABLE CHEST 1 VIEW COMPARISON:  08/17/2015 FINDINGS: Low lung volumes with bibasilar atelectasis. Vascular congestion additional prominence, similar to prior study. No definite effusions or acute bony abnormality. IMPRESSION: Very low lung volumes with bibasilar atelectasis and stable diffuse interstitial prominence. Electronically Signed   By: Charlett Nose M.D.   On: 10/09/2015 07:14   EKG:   Orders placed or performed during the hospital encounter of 10/09/15  . EKG 12-Lead  . EKG 12-Lead     ASSESSMENT AND PLAN:  Saicharan Sparacino  is a 30 y.o. male with a known history of FSGS with CK D stage III, ADHD, schizophrenia and bipolar disorder, diabetes mellitus not on insulin, recent diagnosis of interstitial lung disease, diastolic congestive heart failure presents to the hospital from home secondary to worsening shortness of breath.  #1 Acute hypoxic respiratory failure-secondary to acute on chronic diastolic CHF exacerbation. -IV Lasix started. Change to oral from tomorrow -Also has underlying interstitial lung disease. -No wheezing on exam. Pulmonary consulted. -repeat echocardiogram with normal LV function, EF >60%. -Rocephin and azithromycin for bronchitis symptoms. - wean o2 as tolerated. F/u CXR tomorrow  #2 acute renal failure on CKD stage III-baseline creatinine around 1.9 -Nephrology consulted. History of chronic lithium usage in the past. -Continue losartan low-dose for proteinuria. -Monitor while on Lasix.  #3 hypertension-on losartan and metoprolol. Soft BP today  #4 schizoaffective disorder-stable. Continue Navane, Seroquel and Tegretol. -Klonopin added for anxiety  #5 DVT prophylaxis-on a subcutaneous heparin   Encourage ambulation.   All the records are reviewed and case discussed with Care Management/Social Workerr. Management plans discussed with the patient, family and they are in agreement.  CODE STATUS: Full Code  TOTAL TIME TAKING CARE OF THIS PATIENT: 33 minutes.   POSSIBLE D/C IN 1-2 DAYS, DEPENDING ON CLINICAL CONDITION.   Esmond Hinch M.D on 10/10/2015 at 10:40 AM  Between 7am to 6pm - Pager - [phone]  After 6pm go to [email or web address] - password [email or web address] [facility]  [place] [facility]  Office  [phone]  CC: Primary care physician; Mickel Fuchs, MD

## 2015-10-10 NOTE — Progress Notes (Signed)
[facility]  ROUNDING NOTE   Subjective:  Patient resting comfortably in bed at the moment. Creatinine 2.13 today. Urine protein crit in ratio currently 2.9.   Objective:  Vital signs in last 24 hours:  Temp:  [97.6 F (36.4 C)-98.3 F (36.8 C)] 98.3 F (36.8 C) (07/30 1125) Pulse Rate:  [77-96] 96 (07/30 1125) Resp:  [18-22] 22 (07/30 1125) BP: (93-127)/(58-87) 112/68 (07/30 1125) SpO2:  [99 %-100 %] 100 % (07/30 1125)  Weight change:  Filed Weights   10/09/15 0556  Weight: 95.3 kg (210 lb)    Intake/Output: I/O last 3 completed shifts: In: 500 [P.O.:500] Out: 3500 [Urine:3500]   Intake/Output this shift:  Total I/O In: -  Out: 1700 [Urine:1700]  Physical Exam: General: Laying in bed NAD  Head: Normocephalic, atraumatic. Moist oral mucosal membranes  Eyes: Anicteric  Neck: Supple, trachea midline  Lungs:  Bilateral rhonchi, normal effort  Heart: S1S2 no rubs  Abdomen:  Soft, nontender, BS present  Extremities: trace peripheral edema.  Neurologic: Nonfocal, moving all four extremities  Skin: No lesions  Access:     Basic Metabolic Panel:  Recent Labs Lab 10/09/15 0617 10/10/15 0523  NA 137 135  K 4.5 4.0  CL 105 100*  CO2 23 26  GLUCOSE 107* 99  BUN 24* 33*  CREATININE 2.02* 2.13*  CALCIUM 8.9 9.0    Liver Function Tests:  Recent Labs Lab 10/09/15 0617  AST 25  ALT 10*  ALKPHOS 60  BILITOT 0.6  PROT 8.8*  ALBUMIN 3.2*   No results for input(s): LIPASE, AMYLASE in the last 168 hours. No results for input(s): AMMONIA in the last 168 hours.  CBC:  Recent Labs Lab 10/09/15 0617 10/10/15 0523  WBC 11.2* 7.7  NEUTROABS 9.2*  --   HGB 12.2* 11.3*  HCT 38.5* 35.0*  MCV 69.3* 69.0*  PLT 356 324    Cardiac Enzymes:  Recent Labs Lab 10/09/15 0617 10/09/15 1215 10/09/15 1713 10/09/15 2316  TROPONINI 0.05* 0.03* <0.03 0.03*    BNP: Invalid input(s): POCBNP  CBG:  Recent Labs Lab 10/09/15 1142  10/09/15 1636 10/09/15 2103 10/10/15 0731 10/10/15 1126  GLUCAP 134* 132* 123* 92 187*    Microbiology: Results for orders placed or performed during the hospital encounter of 08/17/15  Culture, blood (routine x 2)     Status: None   Collection Time: 08/17/15  6:46 AM  Result Value Ref Range Status   Specimen Description BLOOD LEFT FOREARM  Final   Special Requests   Final    BOTTLES DRAWN AEROBIC AND ANAEROBIC AER ANA   Culture NO GROWTH 8 DAYS  Final   Report Status 08/25/2015 FINAL  Final  Urine culture     Status: None   Collection Time: 08/17/15  6:46 AM  Result Value Ref Range Status   Specimen Description URINE, RANDOM  Final   Special Requests Normal  Final   Culture NO GROWTH Performed at [facility]   Final   Report Status 08/18/2015 FINAL  Final  Culture, blood (routine x 2)     Status: None   Collection Time: 08/17/15  6:48 AM  Result Value Ref Range Status   Specimen Description BLOOD RIGHT AC  Final   Special Requests BOTTLES DRAWN AEROBIC AND ANAEROBIC   Final   Culture NO GROWTH 8 DAYS  Final   Report Status 08/25/2015 FINAL  Final    Coagulation Studies: No results for input(s): LABPROT, INR in  the last 72 hours.  Urinalysis:  Recent Labs  10/09/15 0616  COLORURINE AMBER*  LABSPEC 1.005  PHURINE 5.0  GLUCOSEU NEGATIVE  HGBUR 1+*  BILIRUBINUR NEGATIVE  KETONESUR NEGATIVE  PROTEINUR 100*  NITRITE POSITIVE*  LEUKOCYTESUR NEGATIVE      Imaging: Dg Chest Port 1 View  Result Date: 10/09/2015 CLINICAL DATA:  Breathing difficulty, shortness of Breath EXAM: PORTABLE CHEST 1 VIEW COMPARISON:  08/17/2015 FINDINGS: Low lung volumes with bibasilar atelectasis. Vascular congestion additional prominence, similar to prior study. No definite effusions or acute bony abnormality. IMPRESSION: Very low lung volumes with bibasilar atelectasis and stable diffuse interstitial prominence. Electronically Signed   By: Charlett Nose M.D.   On:  10/09/2015 07:14    Medications:     . azithromycin  500 mg Intravenous Q24H  . benzonatate  100 mg Oral TID  . carbamazepine  200 mg Oral BID  . cefTRIAXone (ROCEPHIN)  IV  1 g Intravenous Q24H  . clonazePAM  1 mg Oral BID  . [START ON 10/11/2015] furosemide  20 mg Oral Daily  . heparin  5,000 Units Subcutaneous Q8H  . insulin aspart  0-5 Units Subcutaneous QHS  . insulin aspart  0-9 Units Subcutaneous TID WC  . ipratropium-albuterol  3 mL Nebulization TID  . losartan  25 mg Oral QHS  . metaxalone  400 mg Oral BID  . metoprolol tartrate  25 mg Oral BID  . QUEtiapine  100 mg Oral Daily  . QUEtiapine  600 mg Oral QHS  . sodium chloride flush  3 mL Intravenous Q12H  . thiothixene  10 mg Oral BID  . tiotropium  18 mcg Inhalation Daily     Assessment/ Plan:  30 y.o.African American male with history of bipolar disorder, ADHD, history of smoking,chronic kidney disease presents for shortness of breath  1. 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 The patient has had mild fluctuations in renal function recently. His baseline creatinine is 1.9. -  Renal function appears to be relatively stable. Creatinine currently 2.1 with urine protein to creatinine ratio of 2.96. Continue losartan for proteinuria reduction.     LOS: 1 [facility] 7/30/20171:01 PM

## 2015-10-10 NOTE — Progress Notes (Signed)
A&O. Developmentally delayed. Independent. On O2 at 3L as at home. No resp distress during the night. Slept well.

## 2015-10-10 NOTE — Progress Notes (Signed)
PHARMACIST - PHYSICIAN COMMUNICATION  CONCERNING: Antibiotic IV to Oral Route Change Policy  RECOMMENDATION: This patient is receiving AZITHROMYCIN by the intravenous route.  Based on criteria approved by the Pharmacy and Therapeutics Committee, the antibiotic(s) is/are being converted to the equivalent oral dose form(s).   DESCRIPTION: These criteria include:  Patient being treated for a respiratory tract infection, urinary tract infection, cellulitis or clostridium difficile associated diarrhea if on metronidazole  The patient is not neutropenic and does not exhibit a GI malabsorption state  The patient is eating (either orally or via tube) and/or has been taking other orally administered medications for a least 24 hours  The patient is improving clinically and has a Tmax < 100.5  If you have questions about this conversion, please contact the Pharmacy Department  []   [phone] )  [facility] [x]   [phone] )  [facility] []   [phone] )  [facility] []   [phone] )  [facility] []   [phone] )  Ilene Qua   Bari Mantis PharmD Clinical Pharmacist 10/10/2015

## 2015-10-10 NOTE — Progress Notes (Signed)
Weaned pt off of oxygen. Now on room air with 02 stats at 95. Will continue to monitor. Otilio Jefferson, RN

## 2015-10-10 NOTE — Consult Note (Signed)
[facility] Consultation      Assessment and Plan:  Acute respiratory failure, likely secondary to acute bronchitis.- --Continue current abx.  -continue Spiriva, when necessary albuterol. -advance activity, wean down oxygen as tolerated.  Chronic respiratory failure secondary to interstitial lung disease. -discussed with mother on previous admission as well as outpatient visit, given his other comorbidities, we have opted not to proceed with that any invasive workup of his interstitial lung disease but rather treat conservatively.  Developmental disability/autism. -as above. This complicates therapy given the patient's inability to understand many therapies instructions and interventions.  History of congestive heart failure. -treatment is indicated,we'll monitor as this likely exacerbates the patient's respiratory status.  Pt appears to be doing better, wheezing has resolved on auscultation today, can likely discharge from respiratory standpoint. Pulmonary service will sign off for now, please call with any further questions or concerns.    Date: 10/10/2015  MRN# [id number] ESIQUIO PIDCOCK [birth year]  Referring Physician: Dr. Nemiah Commander.   Walter Wong is a 30 y.o. old male seen in consultation for chief complaint of:    Chief Complaint  Patient presents with  . Respiratory Distress    Pt. here via EMS from home for respiratory distress.    Subjective.   Pt appears to be breathing better today.    Medication:    Reviewed.    Allergies:  Review of patient's allergies indicates no known allergies.  Review of Systems: Could not be obtained due to lethargy and fatigue.  Physical Examination:   VS: BP 112/68 (BP Location: Right Arm)   Pulse 96   Temp 98.3 F (36.8 C) (Oral)   Resp (!) 22   Ht 5\' 6"  (1.676 m)   Wt 210 lb (95.3 kg)   SpO2 100%   BMI 33.89 kg/m   General Appearance: No distress  Neuro:without focal findings. HEENT: PERRLA,  EOM intact.   Pulmonary: bilateral wheezing significant improved/minimal to absent today.  CardiovascularNormal S1,S2.  No m/r/g.   Abdomen: Benign, Soft, non-tender. Renal:  No costovertebral tenderness  GU:  No performed at this time. Endoc: No evident thyromegaly, no signs of acromegaly. Skin:   warm, no rashes, no ecchymosis  Extremities: normal, no cyanosis, clubbing.  Other findings:    LABORATORY PANEL:   CBC  Recent Labs Lab 10/10/15 0523  WBC 7.7  HGB 11.3*  HCT 35.0*  PLT 324   ------------------------------------------------------------------------------------------------------------------  Chemistries   Recent Labs Lab 10/09/15 0617 10/10/15 0523  NA 137 135  K 4.5 4.0  CL 105 100*  CO2 23 26  GLUCOSE 107* 99  BUN 24* 33*  CREATININE 2.02* 2.13*  CALCIUM 8.9 9.0  AST 25  --   ALT 10*  --   ALKPHOS 60  --   BILITOT 0.6  --    ------------------------------------------------------------------------------------------------------------------  Cardiac Enzymes  Recent Labs Lab 10/09/15 2316  TROPONINI 0.03*   ------------------------------------------------------------  RADIOLOGY:  Dg Chest Port 1 View  Result Date: 10/09/2015 CLINICAL DATA:  Breathing difficulty, shortness of Breath EXAM: PORTABLE CHEST 1 VIEW COMPARISON:  08/17/2015 FINDINGS: Low lung volumes with bibasilar atelectasis. Vascular congestion additional prominence, similar to prior study. No definite effusions or acute bony abnormality. IMPRESSION: Very low lung volumes with bibasilar atelectasis and stable diffuse interstitial prominence. Electronically Signed   By: Charlett Nose M.D.   On: 10/09/2015 07:14      Thank  you for the consultation and for allowing [facility] to assist in  the care of your patient. Our recommendations are noted above.  Please contact us if we can be of further service.   Wells Guiles, MD.  Board Certified in Internal  Medicine, Pulmonary Medicine, Critical Care Medicine, and Sleep Medicine.  [facility] Office Number: [phone]  Santiago Glad, M.D.  Stephanie Acre, M.D.  Billy Fischer, M.D  10/10/2015

## 2015-10-11 ENCOUNTER — Inpatient Hospital Stay: Payer: Medicare Other

## 2015-10-11 DIAGNOSIS — J9621 Acute and chronic respiratory failure with hypoxia: Secondary | ICD-10-CM

## 2015-10-11 LAB — BASIC METABOLIC PANEL
ANION GAP: 7 (ref 5–15)
BUN: 34 mg/dL — AB (ref 6–20)
CHLORIDE: 102 mmol/L (ref 101–111)
CO2: 27 mmol/L (ref 22–32)
Calcium: 8.8 mg/dL — ABNORMAL LOW (ref 8.9–10.3)
Creatinine, Ser: 2.23 mg/dL — ABNORMAL HIGH (ref 0.61–1.24)
GFR calc Af Amer: 44 mL/min — ABNORMAL LOW (ref >60)
GFR, EST NON AFRICAN AMERICAN: 38 mL/min — AB (ref >60)
Glucose, Bld: 89 mg/dL (ref 65–99)
POTASSIUM: 4.4 mmol/L (ref 3.5–5.1)
SODIUM: 136 mmol/L (ref 135–145)

## 2015-10-11 LAB — GLUCOSE, CAPILLARY
GLUCOSE-CAPILLARY: 107 mg/dL — AB (ref 65–99)
GLUCOSE-CAPILLARY: 87 mg/dL (ref 65–99)
GLUCOSE-CAPILLARY: 99 mg/dL (ref 65–99)
Glucose-Capillary: 79 mg/dL (ref 65–99)

## 2015-10-11 MED ORDER — SODIUM CHLORIDE 0.9 % IV BOLUS (SEPSIS)
250.0000 mL | Freq: Once | INTRAVENOUS | Status: AC
  Administered 2015-10-11: 250 mL via INTRAVENOUS

## 2015-10-11 MED ORDER — METHYLPREDNISOLONE SODIUM SUCC 40 MG IJ SOLR
40.0000 mg | Freq: Two times a day (BID) | INTRAMUSCULAR | Status: DC
  Administered 2015-10-11 – 2015-10-13 (×4): 40 mg via INTRAVENOUS
  Filled 2015-10-11 (×4): qty 1

## 2015-10-11 MED ORDER — SODIUM CHLORIDE 0.9 % IV SOLN
3.0000 g | Freq: Four times a day (QID) | INTRAVENOUS | Status: DC
  Administered 2015-10-11 – 2015-10-13 (×8): 3 g via INTRAVENOUS
  Filled 2015-10-11 (×11): qty 3

## 2015-10-11 NOTE — Progress Notes (Signed)
A&O with developmental delay. Back on 3L O2 due to low O2 SATs on RA. No complaints.  Patient with shortness of breath with minimal exertion.

## 2015-10-11 NOTE — Progress Notes (Signed)
[facility]  Patient Status: Active  [facility] is providing the following services: SN  If patient discharges after hours, please call [phone].   Walter Wong 10/11/2015, 9:16 AM

## 2015-10-11 NOTE — Care Management Important Message (Signed)
Important Message  Patient Details  Name: Walter Wong MRN: [id number] Date of Birth: 1985-08-09   Medicare Important Message Given:  Yes    Marily Memos, RN 10/11/2015, 8:59 AM

## 2015-10-11 NOTE — Progress Notes (Deleted)
[facility] Pulmonary Medicine     Assessment and Plan:  Interstitial lung disease. -Serological workup negative. -Interstitial changes with groundglass changes,  more consistent with an NSIP pattern, than UIP. -Uncertain if changes seen previously on CT scan were due to acute pneumonia changes with combination of pulmonary edema versus chronic changes of pulmonary fibrosis. His respiratory status appears to be improved today, with improved oxygenation. -We'll repeat chest x-ray again in approximately 6 weeks' time and see the patient again at that time. If interstitial changes are persistent at that time we'll repeat a CT of the chest high res. -Given the patient's developmental disability, psychiatric diagnoses, will try to take a conservative approach as the patient is unlikely to really understand his diagnoses, as well as the multiple interventions that would be required  Acute respiratory failure. -The patient was ambulated in the office today, initial oxygen saturation was 94% on room air with heart rate 110, the patient was ambulated 600 feet. Oxygen saturation at the end of the walk was 90% with a heart rate of 125. After the walk. The patient had some desats to the mid 80s, he appeared to take very small and shallow breaths, he was encouraged to take normal breaths at which time the oxygen saturation improved to above 90%. -It was recommended that he could continue using oxygen at night, and as needed during the day. He would not appear that he requires oxygen with ambulation at all times during the day.  Bronchiectasis. -Likely secondary to traction from interstitial lung disease. Does not appear to be primary bronchiectasis.     Date: 10/11/2015  MRN# [id number] TYKE OUTMAN 1985-10-28   Walter Wong is a 30 y.o. old male seen in follow up for chief complaint of  No chief complaint on file.    HPI:   Patient is a 30 year old group home resident with bipolar disorder  and schizophrenia. He was recently seen in the hospital, it was noted that he had mild hilar and paratracheal lymphadenopathy, CT of the chest 08/18/15 showed interstitial lung disease with bronchiectasis. He was started on empiric steroids, and serology testing was sent as part of an ILD workup. This included Anca, Ace level,, CCP, RA, all of which were negative.  Today, he appears to be doing much better, he notes that his breathing is much better than when he was in the hospital. He has no new complaints today. He is in good spirits today, he is accompanied by his mother. He says that he has been doing much better.  Medication:   Facility-Administered Encounter Medications as of 10/12/2015  Medication  . acetaminophen (TYLENOL) tablet 650 mg   Or  . acetaminophen (TYLENOL) suppository 650 mg  . Ampicillin-Sulbactam (UNASYN) 3 g in sodium chloride 0.9 % 100 mL IVPB  . benzonatate (TESSALON) capsule 100 mg  . carbamazepine (TEGRETOL) tablet 200 mg  . clonazePAM (KLONOPIN) tablet 1 mg  . heparin injection 5,000 Units  . insulin aspart (novoLOG) injection 0-5 Units  . insulin aspart (novoLOG) injection 0-9 Units  . ipratropium-albuterol (DUONEB) 0.5-2.5 (3) MG/3ML nebulizer solution 3 mL  . methylPREDNISolone sodium succinate (SOLU-MEDROL) 40 mg/mL injection 40 mg  . ondansetron (ZOFRAN) tablet 4 mg   Or  . ondansetron (ZOFRAN) injection 4 mg  . QUEtiapine (SEROQUEL) tablet 100 mg  . QUEtiapine (SEROQUEL) tablet 600 mg  . sodium chloride flush (NS) 0.9 % injection 3 mL  . thiothixene (NAVANE) capsule 10 mg   Outpatient Encounter Prescriptions  as of 10/12/2015  Medication Sig  . carbamazepine (TEGRETOL) 200 MG tablet Take 1 tablet (200 mg total) by mouth 2 (two) times daily.  . furosemide (LASIX) 20 MG tablet Take 1 tablet (20 mg total) by mouth 2 (two) times daily.  Marland Kitchen losartan (COZAAR) 25 MG tablet Take 1 tablet (25 mg total) by mouth at bedtime. (Patient not taking: Reported on 10/09/2015)    . QUEtiapine (SEROQUEL) 100 MG tablet Take 1 tablet (100 mg total) by mouth daily.  . QUEtiapine (SEROQUEL) 300 MG tablet Take 2 tablets (600 mg total) by mouth at bedtime.  Marland Kitchen thiothixene (NAVANE) 10 MG capsule Take 1 capsule (10 mg total) by mouth 2 (two) times daily.  Marland Kitchen tiotropium (SPIRIVA HANDIHALER) 18 MCG inhalation capsule Place 1 capsule (18 mcg total) into inhaler and inhale daily.     Allergies:  Review of patient's allergies indicates no known allergies.  Review of Systems: Gen:  Denies  fever, sweats. HEENT: Denies blurred vision. Cvc:  No dizziness, chest pain or heaviness Resp:   Denies cough or sputum porduction. Gi: Denies swallowing difficulty, stomach pain. constipation, bowel incontinence Gu:  Denies bladder incontinence, burning urine Ext:   No Joint pain, stiffness. Skin: No skin rash, easy bruising. Endoc:  No polyuria, polydipsia. Psych: No depression, insomnia. Other:  All other systems were reviewed and found to be negative other than what is mentioned in the HPI.   Physical Examination:   VS: There were no vitals taken for this visit.  General Appearance: No distress  Neuro:without focal findings,  speech normal,  HEENT: PERRLA, EOM intact. Pulmonary: normal breath sounds, No wheezing.  Small shallow respirations. CardiovascularNormal S1,S2.  No m/r/g.   Abdomen: Benign, Soft, non-tender. Renal:  No costovertebral tenderness  GU:  Not performed at this time. Endoc: No evident thyromegaly, no signs of acromegaly. Skin:   warm, no rash. Extremities: normal, no cyanosis, clubbing.   LABORATORY PANEL:   CBC  Recent Labs Lab 10/10/15 0523  WBC 7.7  HGB 11.3*  HCT 35.0*  PLT 324   ------------------------------------------------------------------------------------------------------------------  Chemistries   Recent Labs Lab 10/09/15 0617  10/11/15 0443  NA 137  < > 136  K 4.5  < > 4.4  CL 105  < > 102  CO2 23  < > 27  GLUCOSE 107*  <  > 89  BUN 24*  < > 34*  CREATININE 2.02*  < > 2.23*  CALCIUM 8.9  < > 8.8*  AST 25  --   --   ALT 10*  --   --   ALKPHOS 60  --   --   BILITOT 0.6  --   --   < > = values in this interval not displayed. ------------------------------------------------------------------------------------------------------------------  Cardiac Enzymes  Recent Labs Lab 10/09/15 2316  TROPONINI 0.03*   ------------------------------------------------------------  RADIOLOGY:   No results found for this or any previous visit. Results for orders placed during the hospital encounter of 01/22/15  DG Chest 2 View   Narrative CLINICAL DATA:  Shortness of breath with exertion  EXAM: CHEST  2 VIEW  COMPARISON:  Jul 17, 2010  FINDINGS: Degree of inspiration is shallow. There is bibasilar interstitial edema. Heart is upper normal in size with mild pulmonary venous hypertension. No adenopathy. No bone lesions. No pneumothorax.  IMPRESSION: Findings indicative of a degree of congestive heart failure. No airspace consolidation.   Electronically Signed   By: Bretta Bang III M.D.   On: 01/22/2015 11:53    ------------------------------------------------------------------------------------------------------------------  Thank  you for allowing [facility] [place] [facility] to assist in the care of your patient. Our recommendations are noted above.  Please contact us if we can be of further service.   Wells Guiles, MD.  [place] [facility] Office Number: [phone]  Santiago Glad, M.D.  Stephanie Acre, M.D.  Billy Fischer, M.D  10/11/2015

## 2015-10-11 NOTE — Progress Notes (Signed)
Initial Heart Failure Clinic appointment scheduled for October 25, 2015 at 9:00am. Thank you.

## 2015-10-11 NOTE — Progress Notes (Signed)
Speech Therapy Note: received order, reviewed chart notes. Pt is currently off the floor for testing. ST services will f/u w/ pt later this afternoon or in the morning for BSE. Recommend aspiration precautions w/ any oral intake at this time; NSG supervision at meals if indicated and meds given in a puree for easier swallowing if indicated.

## 2015-10-11 NOTE — Discharge Instructions (Signed)
Heart Failure Clinic appointment on October 25, 2015 at 9:00am with Clarisa Kindred, FNP. Please call [phone] to reschedule.

## 2015-10-11 NOTE — Progress Notes (Signed)
   Name: Walter Wong MRN: [id number] DOB: 06/11/1985    ADMISSION DATE:  10/09/2015 CONSULTATION DATE:10/09/15  REFERRING MD :  Dr.Kalisetti  CHIEF COMPLAINT: ILD, Tachpnea  BRIEF PATIENT DESCRIPTION:   30 year old male with developmental disability and autism with hx of ADHD, bipolar disorder, CHF, chronic kidney disease, interstitial lung disease and schizophrenia. Patient noted to have bronchiectasis changes related to ILD .   STUDIES:  6/7 CT chest Extensive central perihilar peribronchial thickening and mildmediastinal lymphadenopathy. . Findings are consistent chronic pulmonary infectious /inflammatory process. Recommend evaluation for SARCOIDOSIS. Chronicaspiration or remote infection could have a similar pattern bronchiectasis.  SUBJECTIVE:  Patient states that patient has been feeling better, denies any shortness of breath, tachypnea  VITAL SIGNS: Temp:  [97.8 F (36.6 C)-98.2 F (36.8 C)] 98 F (36.7 C) (07/31 1106) Pulse Rate:  [77-104] 77 (07/31 1127) Resp:  [16-22] 22 (07/31 1106) BP: (82-133)/(41-91) 90/44 (07/31 1127) SpO2:  [70 %-100 %] 98 % (07/31 1106) FiO2 (%):  [32 %] 32 % (07/31 0748)  PHYSICAL EXAMINATION: General:  Young AA male, on 3l of O2 n/c, appears to be in no acute distress. Neuro:  Awake , alert, oriented, follows command, no focal deficits. HEENT:  Atraumatic, normocephalic, PERRLA, no discharge noted Cardiovascular:  S1S2, RRR., no MRG noted Lungs:  Diminished throughout Abdomen:  Firm, non tender,active bowel sounds Musculoskeletal:  No inflammation/deformity noted Skin:  Grossly intact   Recent Labs Lab 10/09/15 0617 10/10/15 0523 10/11/15 0443  NA 137 135 136  K 4.5 4.0 4.4  CL 105 100* 102  CO2 23 26 27   BUN 24* 33* 34*  CREATININE 2.02* 2.13* 2.23*  GLUCOSE 107* 99 89    Recent Labs Lab 10/09/15 0617 10/10/15 0523  HGB 12.2* 11.3*  HCT 38.5* 35.0*  WBC 11.2* 7.7  PLT 356 324   Dg Chest 2 View  Result Date:  10/11/2015 CLINICAL DATA:  Shortness of Breath EXAM: CHEST  2 VIEW COMPARISON:  10/09/2015 FINDINGS: Cardiac shadow is stable. Low lung volumes are again identified with bibasilar infiltrative changes. The overall appearance is slightly worse when compared with the prior exam. No acute bony abnormality is noted. IMPRESSION: Slight increase in bibasilar infiltrates. Electronically Signed   By: Alcide Clever M.D.   On: 10/11/2015 07:50   ASSESSMENT / PLAN: -Acute on chronic hypoxic respiratory failure  -Markedly abnormal CT cvhest withhronic Interstitial lung disease, extensive bronchiectasis and dense bibasilar consolidation  P -Change antibiotics to ceftriaxone and Metronidazole -SLP eval -Barium swallow to assess for esophageal dysmotility -Initiate systemic steroids as discussed with Dr Nemiah Commander -Continue bronchodilators -Rest per primary   Bincy Varughese,AG-ACNP Pulmonary and Critical Care Medicine [facility]   10/11/2015, 12:23 PM  PCCM ATTENDING: This is a challenging case. He has chronic (dating at least back to Nov 2016) interstitial lung disease with extensive, multilobar bronchiectasis and a basilar predominance. This CT appearance could be consistent with CF but this would be highly unusual in an African American. The report suggests possible sarcoidosis but but the CT appearance is not classic for this. Demographically (based on age and race), this should be a consideration. I note that the current CT chest reveals abundant food debris in the esophagus and the CT pattern would be consistent with chronic aspiration  The note above including assessment and plan has been amended by me  Billy Fischer, MD PCCM service Mobile [phone] Pager [phone] 10/11/2015

## 2015-10-11 NOTE — Progress Notes (Signed)
[facility] - [place] at [facility]   PATIENT NAME: Walter Wong    MR#:  [id number]  DATE OF BIRTH:  [birth year]  SUBJECTIVE:  CHIEF COMPLAINT:   Chief Complaint  Patient presents with  . Respiratory Distress    Pt. here via EMS from home for respiratory distress.   -had another episode of dyspnea, hypoxia last evening - BP low today, urine output of >5Liters yesterday  REVIEW OF SYSTEMS:  Review of Systems  Constitutional: Negative for chills, fever and malaise/fatigue.  HENT: Negative for ear discharge, ear pain and nosebleeds.   Eyes: Negative for blurred vision and double vision.  Respiratory: Positive for shortness of breath. Negative for cough and wheezing.   Cardiovascular: Negative for chest pain, palpitations and orthopnea.  Gastrointestinal: Negative for abdominal pain, constipation, diarrhea, nausea and vomiting.  Genitourinary: Negative for dysuria.  Musculoskeletal: Positive for back pain and myalgias.  Neurological: Negative for dizziness, sensory change, speech change, focal weakness, seizures and headaches.  Psychiatric/Behavioral: The patient is nervous/anxious.     DRUG ALLERGIES:  No Known Allergies  VITALS:  Blood pressure (!) 90/44, pulse 77, temperature 98 F (36.7 C), temperature source Oral, resp. rate (!) 22, height 5\' 6"  (1.676 m), weight 95.3 kg (210 lb), SpO2 98 %.  PHYSICAL EXAMINATION:  Physical Exam  GENERAL:  30 y.o.-year-old patient lying in the bed with no acute distress. Less tachypneic today. EYES: Pupils equal, round, reactive to light and accommodation. No scleral icterus. Extraocular muscles intact.  HEENT: Head atraumatic, normocephalic. Oropharynx and nasopharynx clear.  NECK:  Supple, no jugular venous distention. No thyroid enlargement, no tenderness.  LUNGS: Normal breath sounds bilaterally, no wheezing, rhonchi or crepitation. No use of accessory muscles of respiration. CARDIOVASCULAR: S1, S2 normal. No  murmurs, rubs, or gallops.  ABDOMEN: Soft, nontender, nondistended. Bowel sounds present. No organomegaly or mass.  EXTREMITIES: No pedal edema, cyanosis, or clubbing.  NEUROLOGIC: Cranial nerves II through XII are intact. Muscle strength 5/5 in all extremities. Sensation intact. Gait not checked.  PSYCHIATRIC: The patient is alert and oriented x 3. Decreased intellectual ability. SKIN: No obvious rash, lesion, or ulcer.    LABORATORY PANEL:   CBC  Recent Labs Lab 10/10/15 0523  WBC 7.7  HGB 11.3*  HCT 35.0*  PLT 324   ------------------------------------------------------------------------------------------------------------------  Chemistries   Recent Labs Lab 10/09/15 0617  10/11/15 0443  NA 137  < > 136  K 4.5  < > 4.4  CL 105  < > 102  CO2 23  < > 27  GLUCOSE 107*  < > 89  BUN 24*  < > 34*  CREATININE 2.02*  < > 2.23*  CALCIUM 8.9  < > 8.8*  AST 25  --   --   ALT 10*  --   --   ALKPHOS 60  --   --   BILITOT 0.6  --   --   < > = values in this interval not displayed. ------------------------------------------------------------------------------------------------------------------  Cardiac Enzymes  Recent Labs Lab 10/09/15 2316  TROPONINI 0.03*   ------------------------------------------------------------------------------------------------------------------  RADIOLOGY:  Dg Chest 2 View  Result Date: 10/11/2015 CLINICAL DATA:  Shortness of Breath EXAM: CHEST  2 VIEW COMPARISON:  10/09/2015 FINDINGS: Cardiac shadow is stable. Low lung volumes are again identified with bibasilar infiltrative changes. The overall appearance is slightly worse when compared with the prior exam. No acute bony abnormality is noted. IMPRESSION: Slight increase in bibasilar infiltrates. Electronically Signed   By: Alcide Clever  M.D.   On: 10/11/2015 07:50   EKG:   Orders placed or performed during the hospital encounter of 10/09/15  . EKG 12-Lead  . EKG 12-Lead    ASSESSMENT  AND PLAN:   Walter Wong  is a 30 y.o. male with a known history of FSGS with CK D stage III, ADHD, schizophrenia and bipolar disorder, diabetes mellitus not on insulin, recent diagnosis of interstitial lung disease, diastolic congestive heart failure presents to the hospital from home secondary to worsening shortness of breath.  #1 Acute hypoxic respiratory failure-secondary to acute on chronic diastolic CHF exacerbation. -IV Lasix started. Change to oral from tomorrow - also has chronic underlying lung disease-? Bronchiectasis vs ILD - Hold lasix as BP low and worsening renal function - added steroids, chronic aspiration concern present - added unasyn, swallow eval -repeat echocardiogram with normal LV function, EF >60%. On 3L o2, chronic home o2 - Pulmonary eval today  #2 acute renal failure on CKD stage III-baseline creatinine around 1.9 -Nephrology consulted. History of chronic lithium usage in the past. - slow worsening creatinine, may be over-diuresis - IV fluid bolus today -held losartan today  #3 hypertension-meds held due to hypotension, gentle fluid bolus today  #4 schizoaffective disorder-stable. Continue Navane, Seroquel and Tegretol. -Klonopin added for anxiety  #5 DVT prophylaxis-on a subcutaneous heparin   Encourage ambulation.   All the records are reviewed and case discussed with Care Management/Social Workerr. Management plans discussed with the patient, family and they are in agreement.  CODE STATUS: Full Code  TOTAL TIME TAKING CARE OF THIS PATIENT: 33 minutes.   POSSIBLE D/C TOMORROW, DEPENDING ON CLINICAL CONDITION.   Enid Baas M.D on 10/11/2015 at 12:57 PM  Between 7am to 6pm - Pager - [phone]  After 6pm go to [email or web address] - password [email or web address] [facility]  [place] [facility]  Office  [phone]  CC: Primary care physician; Mickel Fuchs, MD

## 2015-10-11 NOTE — Progress Notes (Signed)
[facility]  ROUNDING NOTE   Subjective:   Resting comfortably. Denies any respiratory symptoms.  UOP 5300 Furosemide  PO daily   Objective:  Vital signs in last 24 hours:  Temp:  [97.8 F (36.6 C)-98.2 F (36.8 C)] 98 F (36.7 C) (07/31 1106) Pulse Rate:  [77-104] 77 (07/31 1127) Resp:  [16-22] 22 (07/31 1106) BP: (82-133)/(41-91) 90/44 (07/31 1127) SpO2:  [70 %-100 %] 98 % (07/31 1106) FiO2 (%):  [32 %] 32 % (07/31 0748)  Weight change:  Filed Weights   10/09/15 0556  Weight: 95.3 kg (210 lb)    Intake/Output: I/O last 3 completed shifts: In: 600 [P.O.:600] Out: 5700 [Urine:5700]   Intake/Output this shift:  Total I/O In: 360 [P.O.:360] Out: 900 [Urine:900]  Physical Exam: General: Laying in bed NAD  Head: Normocephalic, atraumatic. Moist oral mucosal membranes  Eyes: Anicteric  Neck: Supple, trachea midline  Lungs:  Clear bilaterally  Heart: S1S2 no rubs  Abdomen:  Soft, nontender, BS present  Extremities: No peripheral edema.  Neurologic: Nonfocal, moving all four extremities  Skin: No lesions  Access:     Basic Metabolic Panel:  Recent Labs Lab 10/09/15 0617 10/10/15 0523 10/11/15 0443  NA 137 135 136  K 4.5 4.0 4.4  CL 105 100* 102  CO2 GLUCOSE 107* 99 89  BUN 24* 33* 34*  CREATININE 2.02* 2.13* 2.23*  CALCIUM 8.9 9.0 8.8*    Liver Function Tests:  Recent Labs Lab 10/09/15 0617  AST 25  ALT 10*  ALKPHOS 60  BILITOT 0.6  PROT 8.8*  ALBUMIN 3.2*   No results for input(s): LIPASE, AMYLASE in the last 168 hours. No results for input(s): AMMONIA in the last 168 hours.  CBC:  Recent Labs Lab 10/09/15 0617 10/10/15 0523  WBC 11.2* 7.7  NEUTROABS 9.2*  --   HGB 12.2* 11.3*  HCT 38.5* 35.0*  MCV 69.3* 69.0*  PLT 356 324    Cardiac Enzymes:  Recent Labs Lab 10/09/15 0617 10/09/15 1215 10/09/15 1713 10/09/15 2316  TROPONINI 0.05* 0.03* <0.03 0.03*    BNP: Invalid input(s):  POCBNP  CBG:  Recent Labs Lab 10/10/15 1632 10/10/15 2038 10/10/15 2215 10/11/15 0721 10/11/15 1104  GLUCAP 101* 75 120* 107* 87    Microbiology: Results for orders placed or performed during the hospital encounter of 08/17/15  Culture, blood (routine x 2)     Status: None   Collection Time: 08/17/15  6:46 AM  Result Value Ref Range Status   Specimen Description BLOOD LEFT FOREARM  Final   Special Requests   Final    BOTTLES DRAWN AEROBIC AND ANAEROBIC AER ANA   Culture NO GROWTH 8 DAYS  Final   Report Status 08/25/2015 FINAL  Final  Urine culture     Status: None   Collection Time: 08/17/15  6:46 AM  Result Value Ref Range Status   Specimen Description URINE, RANDOM  Final   Special Requests Normal  Final   Culture NO GROWTH Performed at [facility]   Final   Report Status 08/18/2015 FINAL  Final  Culture, blood (routine x 2)     Status: None   Collection Time: 08/17/15  6:48 AM  Result Value Ref Range Status   Specimen Description BLOOD RIGHT [facility]  Final   Special Requests BOTTLES DRAWN AEROBIC AND ANAEROBIC   Final   Culture NO GROWTH 8 DAYS  Final   Report Status 08/25/2015 FINAL  Final    Coagulation Studies: No results for input(s): LABPROT, INR in the last 72 hours.  Urinalysis:  Recent Labs  10/09/15 0616  COLORURINE AMBER*  LABSPEC 1.005  PHURINE 5.0  GLUCOSEU NEGATIVE  HGBUR 1+*  BILIRUBINUR NEGATIVE  KETONESUR NEGATIVE  PROTEINUR 100*  NITRITE POSITIVE*  LEUKOCYTESUR NEGATIVE      Imaging: Dg Chest 2 View  Result Date: 10/11/2015 CLINICAL DATA:  Shortness of Breath EXAM: CHEST  2 VIEW COMPARISON:  10/09/2015 FINDINGS: Cardiac shadow is stable. Low lung volumes are again identified with bibasilar infiltrative changes. The overall appearance is slightly worse when compared with the prior exam. No acute bony abnormality is noted. IMPRESSION: Slight increase in bibasilar infiltrates. Electronically Signed   By: Alcide Clever  M.D.   On: 10/11/2015 07:50    Medications:     . azithromycin  500 mg Oral Daily  . benzonatate  100 mg Oral TID  . carbamazepine  200 mg Oral BID  . cefTRIAXone (ROCEPHIN)  IV  1 g Intravenous Q24H  . clonazePAM  1 mg Oral BID  . furosemide  20 mg Oral Daily  . heparin  5,000 Units Subcutaneous Q8H  . insulin aspart  0-5 Units Subcutaneous QHS  . insulin aspart  0-9 Units Subcutaneous TID WC  . ipratropium-albuterol  3 mL Nebulization TID  . losartan  25 mg Oral QHS  . metoprolol tartrate  25 mg Oral BID  . QUEtiapine  100 mg Oral Daily  . QUEtiapine  600 mg Oral QHS  . sodium chloride flush  3 mL Intravenous Q12H  . thiothixene  10 mg Oral BID  . tiotropium  18 mcg Inhalation Daily     Assessment/ Plan:  30 y.o.African American male with history of bipolar disorder, ADHD, history of smoking,chronic kidney disease presents for shortness of breath  1. 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  - Continue losartan - Monitor volume status.   2. Hypertension: hypotensive currently.  - currently on metoprolol, losartan and furosemide.   3. Interstitial lung disease - appreciate pulmonary input.     LOS: 2 Aayla Marrocco 7/31/201711:55 AM

## 2015-10-12 ENCOUNTER — Ambulatory Visit: Payer: Medicare Other | Admitting: Internal Medicine

## 2015-10-12 ENCOUNTER — Inpatient Hospital Stay: Payer: Medicare Other | Admitting: Registered Nurse

## 2015-10-12 ENCOUNTER — Encounter
Admission: EM | Disposition: A | Discharge status: Home Health | Payer: Self-pay | Source: Home / Self Care | Attending: Internal Medicine

## 2015-10-12 ENCOUNTER — Encounter: Payer: Self-pay | Admitting: *Deleted

## 2015-10-12 DIAGNOSIS — K222 Esophageal obstruction: Secondary | ICD-10-CM

## 2015-10-12 DIAGNOSIS — R933 Abnormal findings on diagnostic imaging of other parts of digestive tract: Secondary | ICD-10-CM

## 2015-10-12 DIAGNOSIS — R198 Other specified symptoms and signs involving the digestive system and abdomen: Secondary | ICD-10-CM

## 2015-10-12 HISTORY — PX: ESOPHAGOGASTRODUODENOSCOPY (EGD) WITH PROPOFOL: SHX5813

## 2015-10-12 LAB — GLUCOSE, CAPILLARY
GLUCOSE-CAPILLARY: 135 mg/dL — AB (ref 65–99)
GLUCOSE-CAPILLARY: 103 mg/dL — AB (ref 65–99)
GLUCOSE-CAPILLARY: 130 mg/dL — AB (ref 65–99)
Glucose-Capillary: 65 mg/dL (ref 65–99)

## 2015-10-12 LAB — BASIC METABOLIC PANEL
ANION GAP: 6 (ref 5–15)
BUN: 37 mg/dL — AB (ref 6–20)
CHLORIDE: 103 mmol/L (ref 101–111)
CO2: 27 mmol/L (ref 22–32)
Calcium: 9.2 mg/dL (ref 8.9–10.3)
Creatinine, Ser: 2.1 mg/dL — ABNORMAL HIGH (ref 0.61–1.24)
GFR calc Af Amer: 47 mL/min — ABNORMAL LOW (ref >60)
GFR calc non Af Amer: 41 mL/min — ABNORMAL LOW (ref >60)
Glucose, Bld: 115 mg/dL — ABNORMAL HIGH (ref 65–99)
POTASSIUM: 4.9 mmol/L (ref 3.5–5.1)
SODIUM: 136 mmol/L (ref 135–145)

## 2015-10-12 SURGERY — ESOPHAGOGASTRODUODENOSCOPY (EGD) WITH PROPOFOL
Anesthesia: General

## 2015-10-12 MED ORDER — PROPOFOL 500 MG/50ML IV EMUL
INTRAVENOUS | Status: DC | PRN
  Administered 2015-10-12: 180 ug/kg/min via INTRAVENOUS

## 2015-10-12 MED ORDER — SODIUM CHLORIDE 0.9 % IV SOLN
INTRAVENOUS | Status: DC
  Administered 2015-10-12: 1000 mL via INTRAVENOUS

## 2015-10-12 MED ORDER — SODIUM CHLORIDE 0.9 % IV BOLUS (SEPSIS)
250.0000 mL | Freq: Once | INTRAVENOUS | Status: AC
  Administered 2015-10-12: 250 mL via INTRAVENOUS

## 2015-10-12 MED ORDER — LIDOCAINE HCL (CARDIAC) 20 MG/ML IV SOLN
INTRAVENOUS | Status: DC | PRN
  Administered 2015-10-12: 50 mg via INTRAVENOUS

## 2015-10-12 MED ORDER — PROPOFOL 10 MG/ML IV BOLUS
INTRAVENOUS | Status: DC | PRN
  Administered 2015-10-12: 70 mg via INTRAVENOUS
  Administered 2015-10-12: 20 mg via INTRAVENOUS

## 2015-10-12 NOTE — Anesthesia Postprocedure Evaluation (Signed)
Anesthesia Post Note  Patient: Walter Wong  Procedure(s) Performed: Procedure(s) (LRB): ESOPHAGOGASTRODUODENOSCOPY (EGD) WITH PROPOFOL (N/A)  Patient location during evaluation: Endoscopy Anesthesia Type: General Level of consciousness: awake and alert Pain management: pain level controlled Vital Signs Assessment: post-procedure vital signs reviewed and stable Respiratory status: spontaneous breathing, nonlabored ventilation, respiratory function stable and patient connected to nasal cannula oxygen Cardiovascular status: blood pressure returned to baseline and stable Postop Assessment: no signs of nausea or vomiting Anesthetic complications: no    Last Vitals:  Vitals:   10/12/15 1742 10/12/15 1934  BP: (!) 133/91 116/78  Pulse: 87 (!) 59  Resp: 19 18  Temp: 36.8 C 36.5 C    Last Pain:  Vitals:   10/12/15 1934  TempSrc: Oral  PainSc:                  Lenard Simmer

## 2015-10-12 NOTE — Anesthesia Procedure Notes (Signed)
Date/Time: 10/12/2015 4:28 PM Performed by: Stormy Fabian Pre-anesthesia Checklist: Patient identified, Emergency Drugs available, Suction available and Patient being monitored Patient Re-evaluated:Patient Re-evaluated prior to inductionOxygen Delivery Method: Nasal cannula Intubation Type: IV induction Dental Injury: Teeth and Oropharynx as per pre-operative assessment  Comments: Nasal cannula with etCO2 monitoring

## 2015-10-12 NOTE — Transfer of Care (Signed)
Immediate Anesthesia Transfer of Care Note  Patient: Walter Wong  Procedure(s) Performed: Procedure(s): ESOPHAGOGASTRODUODENOSCOPY (EGD) WITH PROPOFOL (N/A)  Patient Location: PACU  Anesthesia Type:General  Level of Consciousness: sedated  Airway & Oxygen Therapy: Patient Spontanous Breathing and Patient connected to face mask oxygen  Post-op Assessment: Report given to RN and Post -op Vital signs reviewed and stable  Post vital signs: Reviewed and stable  Last Vitals:  Vitals:   10/12/15 1615 10/12/15 1647  BP: 136/88 (!) 127/109  Pulse:    Resp: 20   Temp:  (!) 35.8 C    Complications: No apparent anesthesia complications

## 2015-10-12 NOTE — Op Note (Signed)
[facility] Patient Name: Walter Wong Procedure Date: 10/12/2015 4:20 PM MRN: [id number] Account #: [id number] Date of Birth: 10-04-1985 Admit Type: Inpatient Age: 30 Room: [facility] ENDO ROOM 4 Gender: Male Note Status: Finalized Procedure:            Upper GI endoscopy Indications:          Abnormal UGI series Providers:            Midge Minium MD, MD Referring MD:         No Local Md, MD (Referring MD) Medicines:            Propofol per Anesthesia Complications:        No immediate complications. Procedure:            Pre-Anesthesia Assessment:                       - Prior to the procedure, a History and Physical was                        performed, and patient medications and allergies were                        reviewed. The patient's tolerance of previous                        anesthesia was also reviewed. The risks and benefits of                        the procedure and the sedation options and risks were                        discussed with the patient. All questions were                        answered, and informed consent was obtained. Prior                        Anticoagulants: The patient has taken no previous                        anticoagulant or antiplatelet agents. ASA Grade                        Assessment: II - A patient with mild systemic disease.                        After reviewing the risks and benefits, the patient was                        deemed in satisfactory condition to undergo the                        procedure.                       After obtaining informed consent, the endoscope was                        passed under direct vision. Throughout the procedure,  the patient's blood pressure, pulse, and oxygen                        saturations were monitored continuously. The Endoscope                        was introduced through the mouth, and advanced to the   second part of duodenum. The upper GI endoscopy was                        accomplished without difficulty. The patient tolerated                        the procedure well. Findings:      A medium-sized hiatal hernia was present.      One mild benign-appearing, intrinsic stenosis was found at the       gastroesophageal junction. And was traversed. A TTS dilator was passed       through the scope. Dilation with a 12-13.5-15 mm balloon dilator was       performed to 18 mm.      A large amount of food (residue) was found in the entire examined       stomach.      The examined duodenum was normal. Impression:           - Medium-sized hiatal hernia.                       - Benign-appearing esophageal stenosis. Dilated.                       - A large amount of food (residue) in the stomach.                       - Normal examined duodenum.                       - No specimens collected. Recommendation:       - Follow an antireflux regimen. Procedure Code(s):    --- Professional ---                       [phone], Esophagogastroduodenoscopy, flexible, transoral;                        with transendoscopic balloon dilation of esophagus                        (less than 30 mm diameter) Diagnosis Code(s):    --- Professional ---                       R93.3, Abnormal findings on diagnostic imaging of other                        parts of digestive tract                       K22.2, Esophageal obstruction CPT copyright 2016 [employer]. All rights reserved. The codes documented in this report are preliminary and upon coder review may  be revised to meet current compliance requirements. Midge Minium MD, MD 10/12/2015 4:49:28 PM This report has been signed electronically. Number  of Addenda: 0 Note Initiated On: 10/12/2015 4:20 PM      [facility]

## 2015-10-12 NOTE — Consult Note (Signed)
Midge Minium, MD [facility]  [address] [place], [place] [address] Phone: [phone] Fax : [phone]  Consultation  Referring Provider:     No ref. provider found Primary Care Physician:  Mickel Fuchs, MD Primary Gastroenterologist:  None.         Reason for Consultation:     Esophageal dysmotility  Date of Admission:  10/09/2015 Date of Consultation:  10/12/2015         HPI:   Walter Wong is a 30 y.o. male who has a history of end-stage renal disease and schizophrenia with bipolar disorder diabetes and interstitial lung disease. The patient had an upper GI x-ray with barium that showed esophageal dysmotility and a possible distal esophageal stricture. The patient is thought to possibly having aspiration as the cause of his shortness of breath and oxygen requirements. The patient had a history of autism and ADHD.  Past Medical History:  Diagnosis Date  . ADHD (attention deficit hyperactivity disorder)   . Bipolar disorder (HCC)   . CHF (congestive heart failure) (HCC)    Diastolic CHF  . Chronic kidney disease    Stage 3  . Diabetes mellitus, type II (HCC)   . Interstitial lung disease (HCC)    on nocturnal home o2  . Schizophrenia [facility])     Past Surgical History:  Procedure Laterality Date  . DENTAL SURGERY      Prior to Admission medications   Medication Sig Start Date End Date Taking? Authorizing Provider  carbamazepine (TEGRETOL) 200 MG tablet Take 1 tablet (200 mg total) by mouth 2 (two) times daily. 05/13/15  Yes Audery Amel, MD  furosemide (LASIX) 20 MG tablet Take 1 tablet (20 mg total) by mouth 2 (two) times daily. 01/24/15  Yes Katha Hamming, MD  QUEtiapine (SEROQUEL) 100 MG tablet Take 1 tablet (100 mg total) by mouth daily. 05/13/15  Yes Audery Amel, MD  QUEtiapine (SEROQUEL) 300 MG tablet Take 2 tablets (600 mg total) by mouth at bedtime. 05/13/15  Yes Audery Amel, MD  thiothixene (NAVANE) 10 MG capsule Take 1 capsule (10 mg total) by  mouth 2 (two) times daily. 05/13/15  Yes Audery Amel, MD  tiotropium (SPIRIVA HANDIHALER) 18 MCG inhalation capsule Place 1 capsule (18 mcg total) into inhaler and inhale daily. 08/30/15 08/29/16 Yes Shane Crutch, MD  losartan (COZAAR) 25 MG tablet Take 1 tablet (25 mg total) by mouth at bedtime. Patient not taking: Reported on 10/09/2015 08/20/15   Enedina Finner, MD    Family History  Problem Relation Age of Onset  . Kidney failure Mother   . Congestive Heart Failure Mother      Social History  Substance Use Topics  . Smoking status: Never Smoker  . Smokeless tobacco: Never Used  . Alcohol use No    Allergies as of 10/09/2015  . (No Known Allergies)    Review of Systems:    All systems reviewed and negative except where noted in HPI.   Physical Exam:  Vital signs in last 24 hours: Temp:  [97.4 F (36.3 C)-98.1 F (36.7 C)] 97.8 F (36.6 C) (08/01 1137) Pulse Rate:  [77-107] 89 (08/01 1137) Resp:  [18-20] 20 (08/01 1137) BP: (95-125)/(57-80) 95/57 (08/01 1137) SpO2:  [98 %-100 %] 100 % (08/01 1137) Last BM Date: 10/10/15 General:   Pleasant, cooperative in NAD Head:  Normocephalic and atraumatic. Eyes:   No icterus.   Conjunctiva pink. PERRLA. Ears:  Normal auditory acuity. Neck:  Supple;  no masses or thyroidomegaly Lungs: Respirations even and unlabored. Lungs clear to auscultation bilaterally.   No wheezes, crackles, or rhonchi.  Heart:  Regular rate and rhythm;  Without murmur, clicks, rubs or gallops Abdomen:  Soft, nondistended, nontender. Normal bowel sounds. No appreciable masses or hepatomegaly.  No rebound or guarding.  Rectal:  Not performed. Msk:  Symmetrical without gross deformities.    Extremities:  Without edema, cyanosis or clubbing. Neurologic:  Alert Skin:  Intact without significant lesions or rashes. Cervical Nodes:  No significant cervical adenopathy. Psych:  Alert and cooperative. Normal affect.  LAB RESULTS:  Recent Labs  10/10/15 0523    WBC 7.7  HGB 11.3*  HCT 35.0*  PLT 324   BMET  Recent Labs  10/10/15 0523 10/11/15 0443 10/12/15 0529  NA 135 136 136  K 4.0 4.4 4.9  CL 100* 102 103  CO2 26 27 27   GLUCOSE 99 89 115*  BUN 33* 34* 37*  CREATININE 2.13* 2.23* 2.10*  CALCIUM 9.0 8.8* 9.2   LFT No results for input(s): PROT, ALBUMIN, AST, ALT, ALKPHOS, BILITOT, BILIDIR, IBILI in the last 72 hours. PT/INR No results for input(s): LABPROT, INR in the last 72 hours.  STUDIES: Dg Chest 2 View  Result Date: 10/11/2015 CLINICAL DATA:  Shortness of Breath EXAM: CHEST  2 VIEW COMPARISON:  10/09/2015 FINDINGS: Cardiac shadow is stable. Low lung volumes are again identified with bibasilar infiltrative changes. The overall appearance is slightly worse when compared with the prior exam. No acute bony abnormality is noted. IMPRESSION: Slight increase in bibasilar infiltrates. Electronically Signed   By: Alcide Clever M.D.   On: 10/11/2015 07:50  Dg Esophagus  Result Date: 10/11/2015 CLINICAL DATA:  30 year old diabetic male with shortness of breath. Question dysmotility. Subsequent encounter. EXAM: ESOPHOGRAM/BARIUM SWALLOW TECHNIQUE: Single contrast examination was performed using  thin barium. FLUOROSCOPY TIME:  Radiation Exposure Index (as provided by the fluoroscopic device): 24.8 micro Gray Fluoroscopy Time:  48 seconds COMPARISON:  10/11/2015 chest x-ray.  08/18/2015 chest CT. FINDINGS: The patient was not able to changed positions to any degree and exam had to be tailored to what the patient was able to perform. This includes single contrast technique with patient in a semi erect frontal position. Marked retained secretions within dilated esophagus. Poor stripping wave. Smooth circumferential narrowing of the distal esophagus. Patient did not ingest a 13 mm barium tablet. Because of the retained secretions, limited for detecting mucosa abnormality or mass. IMPRESSION: Limited exam reveals significant esophageal dysmotility.  Marked retained secretions within dilated esophagus. Poor stripping wave. Smooth circumferential narrowing of the distal esophagus. Electronically Signed   By: Lacy Duverney M.D.   On: 10/11/2015 15:35   Ct Chest High Resolution  Result Date: 10/12/2015 CLINICAL DATA:  30 year old male with history of chronic kidney, and clinical concern for potential interstitial lung disease. EXAM: CT CHEST WITHOUT CONTRAST TECHNIQUE: Multidetector CT imaging of the chest was performed following the standard protocol without intravenous contrast. High resolution imaging of the lungs, as well as inspiratory and expiratory imaging, was performed. COMPARISON:  Chest CT 08/18/2015. FINDINGS: Cardiovascular: Heart size is normal. There is no significant pericardial fluid, thickening or pericardial calcification. No significant atherosclerotic calcifications are identified in the thoracic aorta or the coronary arteries. Mediastinum/Nodes: Multiple borderline enlarged mediastinal lymph nodes are noted, which are nonspecific. Please note that accurate exclusion of hilar adenopathy is limited on noncontrast CT scans. Esophagus is dilated and filled with a large amount of retained food and debris.  Lungs/Pleura: There are extensive areas of subpleural and peribronchovascular reticulation, severe thickening of the peribronchovascular interstitium, parenchymal banding and traction bronchiectasis throughout the lungs bilaterally. The distribution of these findings has a definitive craniocaudal gradient. No frank honeycombing is confidently identified at this time. Inspiratory and expiratory imaging images remarkable for some mild air trapping. No acute consolidative airspace disease. Trace right pleural effusion lying dependently. Upper Abdomen: Unremarkable. Musculoskeletal: There are no aggressive appearing lytic or blastic lesions noted in the visualized portions of the skeleton. IMPRESSION: 1. The appearance of the lungs is compatible  with interstitial lung disease. Given the strong craniocaudal gradient and advanced nature of the findings, this is concerning for potential usual interstitial pneumonia (UIP), however, no frank honeycombing is identified at this time. The possibility of severe fibrotic phase nonspecific interstitial pneumonia (NSIP) is also considered. Repeat high-resolution chest CT is recommended in 6-12 months to assess for further temporal changes in the appearance of the lung parenchyma. 2. Dilated esophagus filled with retained food and debris. The possibility of achalasia should be considered, and further clinical correlation is recommended. Evaluation for signs and symptoms of scleroderma is suggested. 3. Trace right pleural effusion lying dependently. Electronically Signed   By: Trudie Reed M.D.   On: 10/12/2015 10:11      Impression / Plan:   Walter Wong is a 30 y.o. y/o male with with imaging showing esophageal dysmotility and a possible distal esophageal stricture. The patient will be set up for an EGD for today. The patient has been explained the plan and agrees with it.   Thank you for involving me in the care of this patient.      LOS: 3 days   Midge Minium, MD  10/12/2015, 3:26 PM   Note: This dictation was prepared with Dragon dictation along with smaller phrase technology. Any transcriptional errors that result from this process are unintentional.

## 2015-10-12 NOTE — Evaluation (Signed)
Clinical/Bedside Swallow Evaluation Patient Details  Name: Walter Wong MRN: [id number] Date of Birth: [birth year]  Today's Date: 10/12/2015 Time: SLP Start Time (ACUTE ONLY): 0750 SLP Stop Time (ACUTE ONLY): 0835 SLP Time Calculation (min) (ACUTE ONLY): 45 min  Past Medical History:  Past Medical History:  Diagnosis Date  . ADHD (attention deficit hyperactivity disorder)   . Bipolar disorder (HCC)   . CHF (congestive heart failure) (HCC)    Diastolic CHF  . Chronic kidney disease    Stage 3  . Diabetes mellitus, type II (HCC)   . Interstitial lung disease (HCC)    on nocturnal home o2  . Schizophrenia [facility])    Past Surgical History:  Past Surgical History:  Procedure Laterality Date  . DENTAL SURGERY     HPI:  Pt is a 30 y.o. male with a known history of FSGS with CK D stage III, ADHD, schizophrenia and bipolar disorder, diabetes mellitus not on insulin, recent diagnosis of interstitial lung disease, diastolic congestive heart failure presents to the hospital from home secondary to worsening shortness of breath. Pt has been followed by Pulmonology recnetly for potential lung disease. Due to pt's c/o Esophageal dysmotility and discomfort, MD ordered for pt to have a Barium swallow study which was completed yesterday. Per report, there was marked, retained secretions within dilated esophagus. Poor stripping wave. Smooth circumferential narrowing of the distal esophagus. MD has now recommended GI f/u. Pt is awake, alert and talkative. Suspected intellectual decline is noted; pt gave understanding to basic, general questions re: self and followed basic directions w/ verbal cue.    Assessment / Plan / Recommendation Clinical Impression  Pt appeared to adequately tolerate few trials of thin liquids and puree/soft solids w/ no immediate, overt s/s of aspiration noted. Pt consumed trials w/ no coughing or change in vocal quality/ respiratory status. Oral phase of swallowing appeared wfl during  oral intake; pt did appear min impulsive but suspect this is baseline. Pt instructed on general aspiration and Reflux precautions to reduce risk for regurgitation of Reflux which could then be aspirated. Pt's h/o Esophageal phase dysmotility could likely be impacting pt's Esophageal tolerance as well as his risk for aspiration of Regurgitated Reflux material. Recommend GI f/u for further assessment and management. Recommend a mech soft diet consistency moistened well; ongoing discussion w/ pt on following general aspiration precautions to include slowing down when eating/drinking, small bites and sips, and alternating food/liquid. Discussed general Reflux precautions also to include NOT lying down after meals. Pt verbally agreed but suspect would benefit from intermittent monitoring around/after meals for follow through w/ recommendations secondary to baseline status.     Aspiration Risk   (reduced following general precautions)    Diet Recommendation  Mech Soft (dysphagia 3); thin liquids. Foods moistened well; general aspiration and Reflux precautions. Monitoring at meals for follow through w/ precautions by Caregivers.  Medication Administration: Whole meds with puree (if easier for Esophageal clearing and swallowing)    Other  Recommendations Recommended Consults: Consider GI evaluation;Consider esophageal assessment Oral Care Recommendations: Oral care BID;Staff/trained caregiver to provide oral care   Follow up Recommendations  None    Frequency and Duration min 2x/week  1 week (while admitted)       Prognosis Prognosis for Safe Diet Advancement: Good Barriers to Reach Goals:  (baseline medical status)      Swallow Study   General Date of Onset: 10/09/15 HPI: Pt is a 30 y.o. male with a known history of FSGS with  CK D stage III, ADHD, schizophrenia and bipolar disorder, diabetes mellitus not on insulin, recent diagnosis of interstitial lung disease, diastolic congestive heart failure  presents to the hospital from home secondary to worsening shortness of breath. Pt has been followed by Pulmonology recnetly for potential lung disease. Due to pt's c/o Esophageal dysmotility and discomfort, MD ordered for pt to have a Barium swallow study which was completed yesterday. Per report, there was marked, retained secretions within dilated esophagus. Poor stripping wave. Smooth circumferential narrowing of the distal esophagus. MD has now recommended GI f/u. Pt is awake, alert and talkative. Suspected intellectual decline is noted; pt gave understanding to basic, general questions re: self and followed basic directions w/ verbal cue.  Type of Study: Bedside Swallow Evaluation Previous Swallow Assessment: none indicated Diet Prior to this Study: Regular;Thin liquids Temperature Spikes Noted: No (wbc not elevated) Respiratory Status: Nasal cannula (3 liters) History of Recent Intubation: No Behavior/Cognition: Alert;Cooperative;Pleasant mood;Requires cueing;Distractible Oral Cavity Assessment: Within Functional Limits Oral Care Completed by SLP: Recent completion by staff Oral Cavity - Dentition: Adequate natural dentition Vision: Functional for self-feeding Self-Feeding Abilities: Able to feed self;Needs set up Patient Positioning: Upright in bed Baseline Vocal Quality: Normal Volitional Cough: Strong Volitional Swallow: Able to elicit    Oral/Motor/Sensory Function Overall Oral Motor/Sensory Function: Within functional limits   Ice Chips Ice chips: Not tested   Thin Liquid Thin Liquid: Within functional limits Presentation: Cup;Self Fed;Straw (8 trials total)    Nectar Thick Nectar Thick Liquid: Not tested   Honey Thick Honey Thick Liquid: Not tested   Puree Puree: Within functional limits Presentation: Spoon;Self Fed (~3 ozs)   Solid   GO   Solid: Within functional limits Presentation: Self Fed (3 trials) Other Comments: moistened foods         Jerilynn Som, MS,  CCC-SLP  Orpha Dain 10/12/2015,1:30 PM

## 2015-10-12 NOTE — Progress Notes (Signed)
[facility] -  at [facility]   PATIENT NAME: Walter Wong    MR#:  [id number]  DATE OF BIRTH:  09/25/1985  SUBJECTIVE:  CHIEF COMPLAINT:   Chief Complaint  Patient presents with  . Respiratory Distress    Pt. here via EMS from home for respiratory distress.   -Blood pressure low normal. Responding to IV fluids. -Chronic aspiration may be causing his chronic respiratory issues. Awaiting GI input. Kept nothing by mouth after breakfast. -Might need EGD.  REVIEW OF SYSTEMS:  Review of Systems  Constitutional: Negative for chills, fever and malaise/fatigue.  HENT: Negative for ear discharge, ear pain and nosebleeds.   Eyes: Negative for blurred vision and double vision.  Respiratory: Positive for shortness of breath. Negative for cough and wheezing.   Cardiovascular: Negative for chest pain, palpitations and orthopnea.  Gastrointestinal: Negative for abdominal pain, constipation, diarrhea, nausea and vomiting.  Genitourinary: Negative for dysuria.  Musculoskeletal: Positive for back pain and myalgias.  Neurological: Negative for dizziness, sensory change, speech change, focal weakness, seizures and headaches.  Psychiatric/Behavioral: The patient is nervous/anxious.     DRUG ALLERGIES:  No Known Allergies  VITALS:  Blood pressure (!) 95/57, pulse 89, temperature 97.8 F (36.6 C), temperature source Oral, resp. rate 20, height 5\' 6"  (1.676 m), weight 95.3 kg (210 lb), SpO2 100 %.  PHYSICAL EXAMINATION:  Physical Exam  GENERAL:  30 y.o.-year-old patient lying in the bed with no acute distress. Less tachypneic today. EYES: Pupils equal, round, reactive to light and accommodation. No scleral icterus. Extraocular muscles intact.  HEENT: Head atraumatic, normocephalic. Oropharynx and nasopharynx clear.  NECK:  Supple, no jugular venous distention. No thyroid enlargement, no tenderness.  LUNGS: Normal breath sounds bilaterally, no wheezing, rhonchi or  crepitation. No use of accessory muscles of respiration.Bibasilar Rales. CARDIOVASCULAR: S1, S2 normal. No murmurs, rubs, or gallops.  ABDOMEN: Soft, nontender, nondistended. Bowel sounds present. No organomegaly or mass.  EXTREMITIES: No pedal edema, cyanosis, or clubbing.  NEUROLOGIC: Cranial nerves II through XII are intact. Muscle strength 5/5 in all extremities. Sensation intact. Gait not checked.  PSYCHIATRIC: The patient is alert and oriented x 3. Decreased intellectual ability. SKIN: No obvious rash, lesion, or ulcer.    LABORATORY PANEL:   CBC  Recent Labs Lab 10/10/15 0523  WBC 7.7  HGB 11.3*  HCT 35.0*  PLT 324   ------------------------------------------------------------------------------------------------------------------  Chemistries   Recent Labs Lab 10/09/15 0617  10/12/15 0529  NA 137  < > 136  K 4.5  < > 4.9  CL 105  < > 103  CO2 23  < > 27  GLUCOSE 107*  < > 115*  BUN 24*  < > 37*  CREATININE 2.02*  < > 2.10*  CALCIUM 8.9  < > 9.2  AST 25  --   --   ALT 10*  --   --   ALKPHOS 60  --   --   BILITOT 0.6  --   --   < > = values in this interval not displayed. ------------------------------------------------------------------------------------------------------------------  Cardiac Enzymes  Recent Labs Lab 10/09/15 2316  TROPONINI 0.03*   ------------------------------------------------------------------------------------------------------------------  RADIOLOGY:  Dg Chest 2 View  Result Date: 10/11/2015 CLINICAL DATA:  Shortness of Breath EXAM: CHEST  2 VIEW COMPARISON:  10/09/2015 FINDINGS: Cardiac shadow is stable. Low lung volumes are again identified with bibasilar infiltrative changes. The overall appearance is slightly worse when compared with the prior exam. No acute bony abnormality is noted. IMPRESSION: Slight  increase in bibasilar infiltrates. Electronically Signed   By: Alcide Clever M.D.   On: 10/11/2015 07:50  Dg  Esophagus  Result Date: 10/11/2015 CLINICAL DATA:  30 year old diabetic male with shortness of breath. Question dysmotility. Subsequent encounter. EXAM: ESOPHOGRAM/BARIUM SWALLOW TECHNIQUE: Single contrast examination was performed using  thin barium. FLUOROSCOPY TIME:  Radiation Exposure Index (as provided by the fluoroscopic device): 24.8 micro Gray Fluoroscopy Time:  48 seconds COMPARISON:  10/11/2015 chest x-ray.  08/18/2015 chest CT. FINDINGS: The patient was not able to changed positions to any degree and exam had to be tailored to what the patient was able to perform. This includes single contrast technique with patient in a semi erect frontal position. Marked retained secretions within dilated esophagus. Poor stripping wave. Smooth circumferential narrowing of the distal esophagus. Patient did not ingest a 13 mm barium tablet. Because of the retained secretions, limited for detecting mucosa abnormality or mass. IMPRESSION: Limited exam reveals significant esophageal dysmotility. Marked retained secretions within dilated esophagus. Poor stripping wave. Smooth circumferential narrowing of the distal esophagus. Electronically Signed   By: Lacy Duverney M.D.   On: 10/11/2015 15:35   Ct Chest High Resolution  Result Date: 10/12/2015 CLINICAL DATA:  30 year old male with history of chronic kidney, and clinical concern for potential interstitial lung disease. EXAM: CT CHEST WITHOUT CONTRAST TECHNIQUE: Multidetector CT imaging of the chest was performed following the standard protocol without intravenous contrast. High resolution imaging of the lungs, as well as inspiratory and expiratory imaging, was performed. COMPARISON:  Chest CT 08/18/2015. FINDINGS: Cardiovascular: Heart size is normal. There is no significant pericardial fluid, thickening or pericardial calcification. No significant atherosclerotic calcifications are identified in the thoracic aorta or the coronary arteries. Mediastinum/Nodes: Multiple  borderline enlarged mediastinal lymph nodes are noted, which are nonspecific. Please note that accurate exclusion of hilar adenopathy is limited on noncontrast CT scans. Esophagus is dilated and filled with a large amount of retained food and debris. Lungs/Pleura: There are extensive areas of subpleural and peribronchovascular reticulation, severe thickening of the peribronchovascular interstitium, parenchymal banding and traction bronchiectasis throughout the lungs bilaterally. The distribution of these findings has a definitive craniocaudal gradient. No frank honeycombing is confidently identified at this time. Inspiratory and expiratory imaging images remarkable for some mild air trapping. No acute consolidative airspace disease. Trace right pleural effusion lying dependently. Upper Abdomen: Unremarkable. Musculoskeletal: There are no aggressive appearing lytic or blastic lesions noted in the visualized portions of the skeleton. IMPRESSION: 1. The appearance of the lungs is compatible with interstitial lung disease. Given the strong craniocaudal gradient and advanced nature of the findings, this is concerning for potential usual interstitial pneumonia (UIP), however, no frank honeycombing is identified at this time. The possibility of severe fibrotic phase nonspecific interstitial pneumonia (NSIP) is also considered. Repeat high-resolution chest CT is recommended in 6-12 months to assess for further temporal changes in the appearance of the lung parenchyma. 2. Dilated esophagus filled with retained food and debris. The possibility of achalasia should be considered, and further clinical correlation is recommended. Evaluation for signs and symptoms of scleroderma is suggested. 3. Trace right pleural effusion lying dependently. Electronically Signed   By: Trudie Reed M.D.   On: 10/12/2015 10:11    EKG:   Orders placed or performed during the hospital encounter of 10/09/15  . EKG 12-Lead  . EKG 12-Lead     ASSESSMENT AND PLAN:   Walter Wong  is a 30 y.o. male with a known history of FSGS with CK  D stage III, ADHD, schizophrenia and bipolar disorder, diabetes mellitus not on insulin, recent diagnosis of interstitial lung disease, diastolic congestive heart failure presents to the hospital from home secondary to worsening shortness of breath.  #1 Acute hypoxic respiratory failure-secondary to acute on chronic diastolic CHF exacerbation. - Also from chronic aspiration - Really appreciate Dr. Sung Amabile input- due to the chronic changes being present in bilateral lower lobes of the lung on CT, aspiration question was entertained. -Barium swallow done yesterday showing dilated esophagus, significant dysmotility and distal esophageal narrowing. -GI consult requested. Possible EGD today. Patient consented for the procedure after explaining all the risks. Also trying to reach his mother at [phone], unable to leave voicemail - added steroids, Lasix is on hold - Continue unasyn, swallow eval -repeat echocardiogram with normal LV function, EF >60%. On 3L o2, chronic home o2  #2 acute renal failure on CKD stage III-baseline creatinine around 1.9 -Likely ATN. -Nephrology consulted. History of chronic lithium usage in the past. - slow worsening creatinine, may be over-diuresis - lasix discontinued, gentle hydration. -held losartan   #3 hypertension-meds held due to hypotension, gentle fluid bolus today  #4 schizoaffective disorder-stable. Continue Navane, Seroquel and Tegretol. -Klonopin added for anxiety  #5 DVT prophylaxis-on a subcutaneous heparin-discontinue as likely EGD needed   Encourage ambulation.   All the records are reviewed and case discussed with Care Management/Social Workerr. Management plans discussed with the patient, family and they are in agreement.  CODE STATUS: Full Code  TOTAL TIME TAKING CARE OF THIS PATIENT: 33 minutes.   POSSIBLE D/C in 1-2 days,  DEPENDING ON CLINICAL CONDITION.   Janthony Holleman M.D on 10/12/2015 at 1:31 PM  Between 7am to 6pm - Pager - [phone]  After 6pm go to [email or web address] - password [email or web address] [facility]  [facility]  Office  [phone]  CC: Primary care physician; Mickel Fuchs, MD

## 2015-10-12 NOTE — Anesthesia Preprocedure Evaluation (Signed)
Anesthesia Evaluation  Patient identified by MRN, date of birth, ID band Patient awake    Reviewed: Allergy & Precautions, H&P , NPO status , Patient's Chart, lab work & pertinent test results, reviewed documented beta blocker date and time   History of Anesthesia Complications Negative for: history of anesthetic complications  Airway Mallampati: III  TM Distance: >3 FB Neck ROM: full    Dental no notable dental hx. (+) Missing, Poor Dentition, Chipped   Pulmonary shortness of breath, with exertion and Long-Term Oxygen Therapy, neg sleep apnea, pneumonia, resolved, neg COPD, neg recent URI,  Interstitial lung disease   Pulmonary exam normal breath sounds clear to auscultation       Cardiovascular Exercise Tolerance: Good hypertension, (-) angina+CHF  (-) CAD, (-) Past MI, (-) Cardiac Stents and (-) CABG Normal cardiovascular exam(-) dysrhythmias (-) Valvular Problems/Murmurs Rhythm:regular Rate:Normal     Neuro/Psych PSYCHIATRIC DISORDERS (Bipolar and schizophrenia) negative neurological ROS     GI/Hepatic negative GI ROS, Neg liver ROS,   Endo/Other  diabetes  Renal/GU CRFRenal disease  negative genitourinary   Musculoskeletal   Abdominal   Peds  Hematology negative hematology ROS (+)   Anesthesia Other Findings Past Medical History: No date: ADHD (attention deficit hyperactivity disorder) No date: Bipolar disorder (HCC) No date: CHF (congestive heart failure) (HCC)     Comment: Diastolic CHF No date: Chronic kidney disease     Comment: Stage 3 No date: Diabetes mellitus, type II (HCC) No date: Interstitial lung disease (HCC)     Comment: on nocturnal home o2 No date: Schizophrenia (HCC)   Reproductive/Obstetrics negative OB ROS                             Anesthesia Physical Anesthesia Plan  ASA: III  Anesthesia Plan: General   Post-op Pain Management:    Induction:    Airway Management Planned:   Additional Equipment:   Intra-op Plan:   Post-operative Plan:   Informed Consent: I have reviewed the patients History and Physical, chart, labs and discussed the procedure including the risks, benefits and alternatives for the proposed anesthesia with the patient or authorized representative who has indicated his/her understanding and acceptance.   Dental Advisory Given  Plan Discussed with: Anesthesiologist, CRNA and Surgeon  Anesthesia Plan Comments:         Anesthesia Quick Evaluation

## 2015-10-12 NOTE — Progress Notes (Signed)
[facility]  ROUNDING NOTE   Subjective:   UOP 4500 Creatinine 2.1 (2.23)  Objective:  Vital signs in last 24 hours:  Temp:  [97.4 F (36.3 C)-98.1 F (36.7 C)] 97.4 F (36.3 C) (08/01 0450) Pulse Rate:  [77-107] 77 (08/01 0450) Resp:  [18] 18 (08/01 0450) BP: (90-125)/(44-80) 101/68 (08/01 0450) SpO2:  [98 %-100 %] 100 % (08/01 0450)  Weight change:  Filed Weights   10/09/15 0556  Weight: 95.3 kg (210 lb)    Intake/Output: I/O last 3 completed shifts: In: 1360 [P.O.:1360] Out: 6600 [Urine:6600]   Intake/Output this shift:  Total I/O In: 720 [P.O.:720] Out: -   Physical Exam: General: Laying in bed NAD  Head: Normocephalic, atraumatic. Moist oral mucosal membranes  Eyes: Anicteric  Neck: Supple, trachea midline  Lungs:  Clear bilaterally  Heart: S1S2 no rubs  Abdomen:  Soft, nontender, BS present  Extremities: No peripheral edema.  Neurologic: Nonfocal, moving all four extremities  Skin: No lesions  Access:     Basic Metabolic Panel:  Recent Labs Lab 10/09/15 0617 10/10/15 0523 10/11/15 0443 10/12/15 0529  NA 137 135 136 136  K 4.5 4.0 4.4 4.9  CL 105 100* 102 103  CO2 23 26 27 27   GLUCOSE 107* 99 89 115*  BUN 24* 33* 34* 37*  CREATININE 2.02* 2.13* 2.23* 2.10*  CALCIUM 8.9 9.0 8.8* 9.2    Liver Function Tests:  Recent Labs Lab 10/09/15 0617  AST 25  ALT 10*  ALKPHOS 60  BILITOT 0.6  PROT 8.8*  ALBUMIN 3.2*   No results for input(s): LIPASE, AMYLASE in the last 168 hours. No results for input(s): AMMONIA in the last 168 hours.  CBC:  Recent Labs Lab 10/09/15 0617 10/10/15 0523  WBC 11.2* 7.7  NEUTROABS 9.2*  --   HGB 12.2* 11.3*  HCT 38.5* 35.0*  MCV 69.3* 69.0*  PLT 356 324    Cardiac Enzymes:  Recent Labs Lab 10/09/15 0617 10/09/15 1215 10/09/15 1713 10/09/15 2316  TROPONINI 0.05* 0.03* <0.03 0.03*    BNP: Invalid input(s): POCBNP  CBG:  Recent Labs Lab 10/11/15 0721 10/11/15 1104  10/11/15 1620 10/11/15 2116 10/12/15 0756  GLUCAP 107* 87 79 99 135*    Microbiology: Results for orders placed or performed during the hospital encounter of 08/17/15  Culture, blood (routine x 2)     Status: None   Collection Time: 08/17/15  6:46 AM  Result Value Ref Range Status   Specimen Description BLOOD LEFT FOREARM  Final   Special Requests   Final    BOTTLES DRAWN AEROBIC AND ANAEROBIC AER ANA   Culture NO GROWTH 8 DAYS  Final   Report Status 08/25/2015 FINAL  Final  Urine culture     Status: None   Collection Time: 08/17/15  6:46 AM  Result Value Ref Range Status   Specimen Description URINE, RANDOM  Final   Special Requests Normal  Final   Culture NO GROWTH Performed at [facility]   Final   Report Status 08/18/2015 FINAL  Final  Culture, blood (routine x 2)     Status: None   Collection Time: 08/17/15  6:48 AM  Result Value Ref Range Status   Specimen Description BLOOD RIGHT [facility]  Final   Special Requests BOTTLES DRAWN AEROBIC AND ANAEROBIC   Final   Culture NO GROWTH 8 DAYS  Final   Report Status 08/25/2015 FINAL  Final    Coagulation Studies: No results  for input(s): LABPROT, INR in the last 72 hours.  Urinalysis: No results for input(s): COLORURINE, LABSPEC, PHURINE, GLUCOSEU, HGBUR, BILIRUBINUR, KETONESUR, PROTEINUR, UROBILINOGEN, NITRITE, LEUKOCYTESUR in the last 72 hours.  Invalid input(s): APPERANCEUR    Imaging: Dg Chest 2 View  Result Date: 10/11/2015 CLINICAL DATA:  Shortness of Breath EXAM: CHEST  2 VIEW COMPARISON:  10/09/2015 FINDINGS: Cardiac shadow is stable. Low lung volumes are again identified with bibasilar infiltrative changes. The overall appearance is slightly worse when compared with the prior exam. No acute bony abnormality is noted. IMPRESSION: Slight increase in bibasilar infiltrates. Electronically Signed   By: Alcide Clever M.D.   On: 10/11/2015 07:50  Dg Esophagus  Result Date: 10/11/2015 CLINICAL DATA:   30 year old diabetic male with shortness of breath. Question dysmotility. Subsequent encounter. EXAM: ESOPHOGRAM/BARIUM SWALLOW TECHNIQUE: Single contrast examination was performed using  thin barium. FLUOROSCOPY TIME:  Radiation Exposure Index (as provided by the fluoroscopic device): 24.8 micro Gray Fluoroscopy Time:  48 seconds COMPARISON:  10/11/2015 chest x-ray.  08/18/2015 chest CT. FINDINGS: The patient was not able to changed positions to any degree and exam had to be tailored to what the patient was able to perform. This includes single contrast technique with patient in a semi erect frontal position. Marked retained secretions within dilated esophagus. Poor stripping wave. Smooth circumferential narrowing of the distal esophagus. Patient did not ingest a 13 mm barium tablet. Because of the retained secretions, limited for detecting mucosa abnormality or mass. IMPRESSION: Limited exam reveals significant esophageal dysmotility. Marked retained secretions within dilated esophagus. Poor stripping wave. Smooth circumferential narrowing of the distal esophagus. Electronically Signed   By: Lacy Duverney M.D.   On: 10/11/2015 15:35   Ct Chest High Resolution  Result Date: 10/12/2015 CLINICAL DATA:  30 year old male with history of chronic kidney, and clinical concern for potential interstitial lung disease. EXAM: CT CHEST WITHOUT CONTRAST TECHNIQUE: Multidetector CT imaging of the chest was performed following the standard protocol without intravenous contrast. High resolution imaging of the lungs, as well as inspiratory and expiratory imaging, was performed. COMPARISON:  Chest CT 08/18/2015. FINDINGS: Cardiovascular: Heart size is normal. There is no significant pericardial fluid, thickening or pericardial calcification. No significant atherosclerotic calcifications are identified in the thoracic aorta or the coronary arteries. Mediastinum/Nodes: Multiple borderline enlarged mediastinal lymph nodes are noted,  which are nonspecific. Please note that accurate exclusion of hilar adenopathy is limited on noncontrast CT scans. Esophagus is dilated and filled with a large amount of retained food and debris. Lungs/Pleura: There are extensive areas of subpleural and peribronchovascular reticulation, severe thickening of the peribronchovascular interstitium, parenchymal banding and traction bronchiectasis throughout the lungs bilaterally. The distribution of these findings has a definitive craniocaudal gradient. No frank honeycombing is confidently identified at this time. Inspiratory and expiratory imaging images remarkable for some mild air trapping. No acute consolidative airspace disease. Trace right pleural effusion lying dependently. Upper Abdomen: Unremarkable. Musculoskeletal: There are no aggressive appearing lytic or blastic lesions noted in the visualized portions of the skeleton. IMPRESSION: 1. The appearance of the lungs is compatible with interstitial lung disease. Given the strong craniocaudal gradient and advanced nature of the findings, this is concerning for potential usual interstitial pneumonia (UIP), however, no frank honeycombing is identified at this time. The possibility of severe fibrotic phase nonspecific interstitial pneumonia (NSIP) is also considered. Repeat high-resolution chest CT is recommended in 6-12 months to assess for further temporal changes in the appearance of the lung parenchyma. 2. Dilated esophagus filled with  retained food and debris. The possibility of achalasia should be considered, and further clinical correlation is recommended. Evaluation for signs and symptoms of scleroderma is suggested. 3. Trace right pleural effusion lying dependently. Electronically Signed   By: Trudie Reed M.D.   On: 10/12/2015 10:11     Medications:     . ampicillin-sulbactam (UNASYN) IV  3 g Intravenous Q6H  . benzonatate  100 mg Oral TID  . carbamazepine  200 mg Oral BID  . clonazePAM  1 mg  Oral BID  . heparin  5,000 Units Subcutaneous Q8H  . insulin aspart  0-5 Units Subcutaneous QHS  . insulin aspart  0-9 Units Subcutaneous TID WC  . ipratropium-albuterol  3 mL Nebulization TID  . methylPREDNISolone (SOLU-MEDROL) injection  40 mg Intravenous Q12H  . QUEtiapine  100 mg Oral Daily  . QUEtiapine  600 mg Oral QHS  . sodium chloride flush  3 mL Intravenous Q12H  . thiothixene  10 mg Oral BID     Assessment/ Plan:  30 y.o.African American male with history of bipolar disorder, ADHD, history of smoking,chronic kidney disease presents for shortness of breath  1. 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  - Continue losartan - Monitor volume status.   2. Hypertension: hypotensive currently.  - currently on metoprolol, losartan and furosemide.   3. Interstitial lung disease: with aspirations and esophageal dysmotility. Now on aspiration precautions.  - appreciate pulmonary input.     LOS: 3 Mlissa Tamayo 8/1/201711:17 AM

## 2015-10-12 NOTE — Care Management (Signed)
Attempted to call Loree Fee, patients contact without success. No family at bedside.

## 2015-10-13 ENCOUNTER — Encounter: Payer: Self-pay | Admitting: Gastroenterology

## 2015-10-13 LAB — BASIC METABOLIC PANEL
Anion gap: 7 (ref 5–15)
BUN: 35 mg/dL — ABNORMAL HIGH (ref 6–20)
CALCIUM: 9 mg/dL (ref 8.9–10.3)
CO2: 26 mmol/L (ref 22–32)
CREATININE: 1.9 mg/dL — AB (ref 0.61–1.24)
Chloride: 104 mmol/L (ref 101–111)
GFR calc non Af Amer: 46 mL/min — ABNORMAL LOW (ref >60)
GFR, EST AFRICAN AMERICAN: 54 mL/min — AB (ref >60)
Glucose, Bld: 121 mg/dL — ABNORMAL HIGH (ref 65–99)
Potassium: 4.8 mmol/L (ref 3.5–5.1)
SODIUM: 137 mmol/L (ref 135–145)

## 2015-10-13 LAB — GLUCOSE, CAPILLARY: Glucose-Capillary: 130 mg/dL — ABNORMAL HIGH (ref 65–99)

## 2015-10-13 MED ORDER — BENZONATATE 100 MG PO CAPS: 100.0000 mg | ORAL_CAPSULE | Freq: Three times a day (TID) | ORAL | 0 refills | Status: DC

## 2015-10-13 MED ORDER — PANTOPRAZOLE SODIUM 40 MG PO TBEC: 40.0000 mg | DELAYED_RELEASE_TABLET | Freq: Two times a day (BID) | ORAL | 2 refills | Status: AC

## 2015-10-13 MED ORDER — PREDNISONE 10 MG (21) PO TBPK: 10.0000 mg | ORAL_TABLET | Freq: Every day | ORAL | 0 refills | Status: DC

## 2015-10-13 MED ORDER — PANTOPRAZOLE SODIUM 40 MG PO TBEC
40.0000 mg | DELAYED_RELEASE_TABLET | Freq: Two times a day (BID) | ORAL | Status: DC
  Administered 2015-10-13: 40 mg via ORAL
  Filled 2015-10-13: qty 1

## 2015-10-13 MED ORDER — AMOXICILLIN-POT CLAVULANATE 875-125 MG PO TABS: 1.0000 | ORAL_TABLET | Freq: Two times a day (BID) | ORAL | 0 refills | Status: DC

## 2015-10-13 NOTE — Progress Notes (Signed)
Patient discharged home as per order, discharge instruction provided, iv removed tele removed, patient discharge home as per order.

## 2015-10-13 NOTE — Progress Notes (Signed)
Speech Therapy Note: pt seen by GI for EGD yesterday afternoon. Per GI note, esophageal dilitation performed. Today, per chart notes, pt is tolerating an oral diet and is being discharged home. ST will be available for any questions until discharged.

## 2015-10-13 NOTE — Discharge Summary (Addendum)
[employer] [facility] - [place] at [facility]   PATIENT NAME: Walter Wong    MR#:  [id number]  DATE OF BIRTH:  [birth year]  DATE OF ADMISSION:  10/09/2015   ADMITTING PHYSICIAN: Enid Baas, MD  DATE OF DISCHARGE: 10/13/2015  PRIMARY CARE PHYSICIAN: Mickel Fuchs, MD   ADMISSION DIAGNOSIS:   Shortness of breath [R06.02] Respiratory distress [R06.00] Acute on chronic congestive heart failure, unspecified congestive heart failure type (HCC) [I50.9]  DISCHARGE DIAGNOSIS:   Active Problems:   CHF exacerbation (HCC)   Stricture and stenosis of esophagus   Abnormal findings-gastrointestinal tract   SECONDARY DIAGNOSIS:   Past Medical History:  Diagnosis Date  . ADHD (attention deficit hyperactivity disorder)   . Bipolar disorder (HCC)   . CHF (congestive heart failure) (HCC)    Diastolic CHF  . Chronic kidney disease    Stage 3  . Diabetes mellitus, type II (HCC)   . Interstitial lung disease (HCC)    on nocturnal home o2  . Schizophrenia [facility])     HOSPITAL COURSE:   Walter Torainis a 30 y.o. malewith a known history of FSGS with CK D stage III, ADHD, schizophrenia and bipolar disorder, diabetes mellitus not on insulin, recent diagnosis of interstitial lung disease, diastolic congestive heart failure presents to the hospital from home secondary to worsening shortness of breath.  #1 Acute hypoxic respiratory failure-secondary to acute on chronic diastolic CHF exacerbation. - Also from chronic aspiration - Really appreciate Dr. Sung Amabile input- due to the chronic changes being present in bilateral lower lobes of the lung on CT, aspiration question was entertained. -Barium swallow done  showing dilated esophagus, significant dysmotility and distal esophageal narrowing. -GI consult requested. EGD done showing Esophageal stricture that was dilated. -Patient tolerating regular diet fine. -Started on Protonix twice a day. Also aspiration precautions about  sitting up right after a meal discussed. - Change steroids to prednisone taper. -Continue to hold Lasix and can be restarted as an outpatient. - Unasyn changed to oral Augmentin at discharge. - can benefit from Afflac vest for secretions, due to underlying illness, patient unable to produce secretions when flutter valve and suctioning tried in the hospital. -repeat echocardiogram with normal LV function, EF >60%. - Patient has 2 L oxygen at home that he uses at nighttime and also exertion. Continue to use oxygen  #2 acute renal failure on CKD stage III-baseline creatinine around 1.9 -Likely ATN. Renal function is now back to baseline. -Nephrology consulted. History of chronic lithium usage in the past. - lasix  and losartan held in the hospital due to soft blood pressures. They can be restarted as outpatient.  #3 hypertension-meds held due to hypotension, continue to hold low dose losartan. It was anyways started for proteinuria  #4 schizoaffective disorder-stable. Continue Navane, Seroquel and Tegretol. -Outpatient follow-up recommended   Patient can be discharged home today.  DISCHARGE CONDITIONS:   Stable   CONSULTS OBTAINED:   Treatment Team:  Midge Minium, MD  DRUG ALLERGIES:   No Known Allergies DISCHARGE MEDICATIONS:     Medication List    STOP taking these medications   furosemide 20 MG tablet Commonly known as:  LASIX   losartan 25 MG tablet Commonly known as:  COZAAR     TAKE these medications   amoxicillin-clavulanate 875-125 MG tablet Commonly known as:  AUGMENTIN Take 1 tablet by mouth 2 (two) times daily. X 6 more days   benzonatate 100 MG capsule Commonly known as:  TESSALON Take 1 capsule (  100 mg total) by mouth 3 (three) times daily. X 5 days   carbamazepine 200 MG tablet Commonly known as:  TEGRETOL Take 1 tablet (200 mg total) by mouth 2 (two) times daily.   pantoprazole 40 MG tablet Commonly known as:  PROTONIX Take 1 tablet (40 mg  total) by mouth 2 (two) times daily.   predniSONE 10 MG (21) Tbpk tablet Commonly known as:  STERAPRED UNI-PAK 21 TAB Take 1 tablet (10 mg total) by mouth daily. 6 tabs PO x 1 day 5 tabs PO x 1 day 4 tabs PO x 1 day 3 tabs PO x 1 day 2 tabs PO x 1 day 1 tab PO x 1 day and stop   QUEtiapine 300 MG tablet Commonly known as:  SEROQUEL Take 2 tablets (600 mg total) by mouth at bedtime.   QUEtiapine 100 MG tablet Commonly known as:  SEROQUEL Take 1 tablet (100 mg total) by mouth daily.   thiothixene 10 MG capsule Commonly known as:  NAVANE Take 1 capsule (10 mg total) by mouth 2 (two) times daily.   tiotropium 18 MCG inhalation capsule Commonly known as:  SPIRIVA HANDIHALER Place 1 capsule (18 mcg total) into inhaler and inhale daily.        DISCHARGE INSTRUCTIONS:    DIET:   Low-sodium diet  ACTIVITY:   As tolerated  OXYGEN:   Home Oxygen: Yes.    Oxygen Delivery: 2 liters/min via Patient connected to nasal cannula oxygen  DISCHARGE LOCATION:   home   If you experience worsening of your admission symptoms, develop shortness of breath, life threatening emergency, suicidal or homicidal thoughts you must seek medical attention immediately by calling 911 or calling your MD immediately  if symptoms less severe.  You Must read complete instructions/literature along with all the possible adverse reactions/side effects for all the Medicines you take and that have been prescribed to you. Take any new Medicines after you have completely understood and accpet all the possible adverse reactions/side effects.   Please note  You were cared for by a hospitalist during your hospital stay. If you have any questions about your discharge medications or the care you received while you were in the hospital after you are discharged, you can call the unit and asked to speak with the hospitalist on call if the hospitalist that took care of you is not available. Once you are discharged, your  primary care physician will handle any further medical issues. Please note that NO REFILLS for any discharge medications will be authorized once you are discharged, as it is imperative that you return to your primary care physician (or establish a relationship with a primary care physician if you do not have one) for your aftercare needs so that they can reassess your need for medications and monitor your lab values.    On the day of Discharge:  VITAL SIGNS:   Blood pressure 117/86, pulse 87, temperature 97.4 F (36.3 C), temperature source Oral, resp. rate 18, height 5\' 6"  (1.676 m), weight 95.3 kg (210 lb), SpO2 93 %.  PHYSICAL EXAMINATION:    GENERAL:  30 y.o.-year-old patient lying in the bed with no acute distress. Does not appear to be tachypneic EYES: Pupils equal, round, reactive to light and accommodation. No scleral icterus. Extraocular muscles intact.  HEENT: Head atraumatic, normocephalic. Oropharynx and nasopharynx clear.  NECK:  Supple, no jugular venous distention. No thyroid enlargement, no tenderness.  LUNGS: Normal breath sounds bilaterally, no wheezing, rales or crepitation.  No use of accessory muscles of respiration. Coarse rhonchi at the bases. CARDIOVASCULAR: S1, S2 normal. No murmurs, rubs, or gallops.  ABDOMEN: Soft, non-tender, non-distended. Bowel sounds present. No organomegaly or mass.  EXTREMITIES: No pedal edema, cyanosis, or clubbing.  NEUROLOGIC: Cranial nerves II through XII are intact. Muscle strength 5/5 in all extremities. Sensation intact. Gait normal.  PSYCHIATRIC: The patient is alert and oriented x 3. Decreased intellectual ability. SKIN: No obvious rash, lesion, or ulcer.   DATA REVIEW:   CBC  Recent Labs Lab 10/10/15 0523  WBC 7.7  HGB 11.3*  HCT 35.0*  PLT 324    Chemistries   Recent Labs Lab 10/09/15 0617  10/13/15 0432  NA 137  < > 137  K 4.5  < > 4.8  CL 105  < > 104  CO2 23  < > 26  GLUCOSE 107*  < > 121*  BUN 24*  < >  35*  CREATININE 2.02*  < > 1.90*  CALCIUM 8.9  < > 9.0  AST 25  --   --   ALT 10*  --   --   ALKPHOS 60  --   --   BILITOT 0.6  --   --   < > = values in this interval not displayed.   Microbiology Results  Results for orders placed or performed during the hospital encounter of 08/17/15  Culture, blood (routine x 2)     Status: None   Collection Time: 08/17/15  6:46 AM  Result Value Ref Range Status   Specimen Description BLOOD LEFT FOREARM  Final   Special Requests   Final    BOTTLES DRAWN AEROBIC AND ANAEROBIC AER ANA   Culture NO GROWTH 8 DAYS  Final   Report Status 08/25/2015 FINAL  Final  Urine culture     Status: None   Collection Time: 08/17/15  6:46 AM  Result Value Ref Range Status   Specimen Description URINE, RANDOM  Final   Special Requests Normal  Final   Culture NO GROWTH Performed at [facility]   Final   Report Status 08/18/2015 FINAL  Final  Culture, blood (routine x 2)     Status: None   Collection Time: 08/17/15  6:48 AM  Result Value Ref Range Status   Specimen Description BLOOD RIGHT [facility]  Final   Special Requests BOTTLES DRAWN AEROBIC AND ANAEROBIC   Final   Culture NO GROWTH 8 DAYS  Final   Report Status 08/25/2015 FINAL  Final    RADIOLOGY:  No results found.   Management plans discussed with the patient, family and they are in agreement.  CODE STATUS:     Code Status Orders        Start     Ordered   10/09/15 1129  Full code  Continuous     10/09/15 1128    Code Status History    Date Active Date Inactive Code Status Order ID Comments User Context   08/17/2015  2:35 PM 08/20/2015  5:27 PM Full Code [id number]  Enedina Finner, MD ED   01/22/2015  5:27 PM 01/24/2015  2:25 PM Full Code [id number]  Houston Siren, MD Inpatient      TOTAL TIME TAKING CARE OF THIS PATIENT: 37 minutes.    Walter Wong M.D on 10/13/2015 at 8:57 AM  Between 7am to 6pm - Pager - [phone]  After 6pm go to [email or web address] - [employer] [facility]  Office  [phone]  CC: Primary care physician; Mickel Fuchs, MD   Note: This dictation was prepared with Dragon dictation along with smaller phrase technology. Any transcriptional errors that result from this process are unintentional.

## 2015-10-13 NOTE — Care Management (Signed)
Notified [facility] of discharge and orders to resume home health nursing

## 2015-10-13 NOTE — Progress Notes (Signed)
[facility]  ROUNDING NOTE   Subjective:   Creatinine back to baseline Endoscopy and esophageal dilation yesterday by Dr. Servando Snare  Objective:  Vital signs in last 24 hours:  Temp:  [96.4 F (35.8 C)-98.4 F (36.9 C)] 97.4 F (36.3 C) (08/02 0845) Pulse Rate:  [59-89] 87 (08/02 0845) Resp:  [16-20] 18 (08/02 0845) BP: (95-136)/(57-109) 117/86 (08/02 0845) SpO2:  [93 %-100 %] 93 % (08/02 0845)  Weight change:  Filed Weights   10/09/15 0556  Weight: 95.3 kg (210 lb)    Intake/Output: I/O last 3 completed shifts: In: 1160 [P.O.:960; I.V.:200] Out: 4400 [Urine:4400]   Intake/Output this shift:  Total I/O In: 480 [P.O.:480] Out: 1050 [Urine:1050]  Physical Exam: General: NAD, sitting in chair  Head: Normocephalic, atraumatic. Moist oral mucosal membranes  Eyes: Anicteric, PERRL  Neck: Supple, trachea midline  Lungs:  Clear to auscultation  Heart: Regular rate and rhythm  Abdomen:  Soft, nontender,   Extremities:  no peripheral edema.  Neurologic: Nonfocal, moving all four extremities  Skin: No lesions       Basic Metabolic Panel:  Recent Labs Lab 10/09/15 0617 10/10/15 0523 10/11/15 0443 10/12/15 0529 10/13/15 0432  NA 137 135 136 136 137  K 4.5 4.0 4.4 4.9 4.8  CL 105 100* 102 103 104  CO2 23 26 27 27 26   GLUCOSE 107* 99 89 115* 121*  BUN 24* 33* 34* 37* 35*  CREATININE 2.02* 2.13* 2.23* 2.10* 1.90*  CALCIUM 8.9 9.0 8.8* 9.2 9.0    Liver Function Tests:  Recent Labs Lab 10/09/15 0617  AST 25  ALT 10*  ALKPHOS 60  BILITOT 0.6  PROT 8.8*  ALBUMIN 3.2*   No results for input(s): LIPASE, AMYLASE in the last 168 hours. No results for input(s): AMMONIA in the last 168 hours.  CBC:  Recent Labs Lab 10/09/15 0617 10/10/15 0523  WBC 11.2* 7.7  NEUTROABS 9.2*  --   HGB 12.2* 11.3*  HCT 38.5* 35.0*  MCV 69.3* 69.0*  PLT 356 324    Cardiac Enzymes:  Recent Labs Lab 10/09/15 0617 10/09/15 1215 10/09/15 1713 10/09/15 2316   TROPONINI 0.05* 0.03* <0.03 0.03*    BNP: Invalid input(s): POCBNP  CBG:  Recent Labs Lab 10/12/15 0756 10/12/15 1136 10/12/15 1732 10/12/15 2045 10/13/15 0728  GLUCAP 135* 103* 130* 65 130*    Microbiology: Results for orders placed or performed during the hospital encounter of 08/17/15  Culture, blood (routine x 2)     Status: None   Collection Time: 08/17/15  6:46 AM  Result Value Ref Range Status   Specimen Description BLOOD LEFT FOREARM  Final   Special Requests   Final    BOTTLES DRAWN AEROBIC AND ANAEROBIC AER ANA   Culture NO GROWTH 8 DAYS  Final   Report Status 08/25/2015 FINAL  Final  Urine culture     Status: None   Collection Time: 08/17/15  6:46 AM  Result Value Ref Range Status   Specimen Description URINE, RANDOM  Final   Special Requests Normal  Final   Culture NO GROWTH Performed at [facility]   Final   Report Status 08/18/2015 FINAL  Final  Culture, blood (routine x 2)     Status: None   Collection Time: 08/17/15  6:48 AM  Result Value Ref Range Status   Specimen Description BLOOD RIGHT [facility]  Final   Special Requests BOTTLES DRAWN AEROBIC AND ANAEROBIC   Final   Culture  NO GROWTH 8 DAYS  Final   Report Status 08/25/2015 FINAL  Final    Coagulation Studies: No results for input(s): LABPROT, INR in the last 72 hours.  Urinalysis: No results for input(s): COLORURINE, LABSPEC, PHURINE, GLUCOSEU, HGBUR, BILIRUBINUR, KETONESUR, PROTEINUR, UROBILINOGEN, NITRITE, LEUKOCYTESUR in the last 72 hours.  Invalid input(s): APPERANCEUR    Imaging: Dg Esophagus  Result Date: 10/11/2015 CLINICAL DATA:  30 year old diabetic male with shortness of breath. Question dysmotility. Subsequent encounter. EXAM: ESOPHOGRAM/BARIUM SWALLOW TECHNIQUE: Single contrast examination was performed using  thin barium. FLUOROSCOPY TIME:  Radiation Exposure Index (as provided by the fluoroscopic device): 24.8 micro Gray Fluoroscopy Time:  48 seconds  COMPARISON:  10/11/2015 chest x-ray.  08/18/2015 chest CT. FINDINGS: The patient was not able to changed positions to any degree and exam had to be tailored to what the patient was able to perform. This includes single contrast technique with patient in a semi erect frontal position. Marked retained secretions within dilated esophagus. Poor stripping wave. Smooth circumferential narrowing of the distal esophagus. Patient did not ingest a 13 mm barium tablet. Because of the retained secretions, limited for detecting mucosa abnormality or mass. IMPRESSION: Limited exam reveals significant esophageal dysmotility. Marked retained secretions within dilated esophagus. Poor stripping wave. Smooth circumferential narrowing of the distal esophagus. Electronically Signed   By: Lacy Duverney M.D.   On: 10/11/2015 15:35     Medications:     . ampicillin-sulbactam (UNASYN) IV  3 g Intravenous Q6H  . benzonatate  100 mg Oral TID  . carbamazepine  200 mg Oral BID  . clonazePAM  1 mg Oral BID  . insulin aspart  0-5 Units Subcutaneous QHS  . insulin aspart  0-9 Units Subcutaneous TID WC  . ipratropium-albuterol  3 mL Nebulization TID  . methylPREDNISolone (SOLU-MEDROL) injection  40 mg Intravenous Q12H  . pantoprazole  40 mg Oral BID  . QUEtiapine  100 mg Oral Daily  . QUEtiapine  600 mg Oral QHS  . sodium chloride flush  3 mL Intravenous Q12H  . thiothixene  10 mg Oral BID   acetaminophen **OR** acetaminophen, ondansetron **OR** ondansetron (ZOFRAN) IV  Assessment/ Plan:  Mr. FERRY MATTHIS is a 30 y.o. black male with developmental disorder, bipolar disorder, ADHD, history of smoking, admitted on 10/09/2015  1. 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  - Continue losartan - Monitor volume status.   2. Hypertension: hypotensive currently.  - currently on metoprolol, losartan and furosemide.   3. Interstitial  lung disease: with aspirations and esophageal dysmotility. Status post esophageal dilation.   Will need outpatient follow up with Nephrology.    LOS: 4 Aceyn Kathol 8/2/201711:09 AM

## 2015-10-22 ENCOUNTER — Encounter: Payer: Self-pay | Admitting: Emergency Medicine

## 2015-10-22 ENCOUNTER — Inpatient Hospital Stay
Admission: EM | Admit: 2015-10-22 | Discharge: 2015-10-26 | DRG: 190 | Disposition: A | Discharge status: Home / Self Care | Payer: Medicare Other | Attending: Specialist | Admitting: Specialist

## 2015-10-22 ENCOUNTER — Emergency Department: Payer: Medicare Other

## 2015-10-22 DIAGNOSIS — R0902 Hypoxemia: Secondary | ICD-10-CM | POA: Diagnosis present

## 2015-10-22 DIAGNOSIS — Z79899 Other long term (current) drug therapy: Secondary | ICD-10-CM | POA: Diagnosis not present

## 2015-10-22 DIAGNOSIS — J9621 Acute and chronic respiratory failure with hypoxia: Secondary | ICD-10-CM | POA: Diagnosis present

## 2015-10-22 DIAGNOSIS — Z8249 Family history of ischemic heart disease and other diseases of the circulatory system: Secondary | ICD-10-CM | POA: Diagnosis not present

## 2015-10-22 DIAGNOSIS — Y95 Nosocomial condition: Secondary | ICD-10-CM | POA: Diagnosis present

## 2015-10-22 DIAGNOSIS — E1122 Type 2 diabetes mellitus with diabetic chronic kidney disease: Secondary | ICD-10-CM | POA: Diagnosis present

## 2015-10-22 DIAGNOSIS — N183 Chronic kidney disease, stage 3 (moderate): Secondary | ICD-10-CM | POA: Diagnosis present

## 2015-10-22 DIAGNOSIS — J849 Interstitial pulmonary disease, unspecified: Secondary | ICD-10-CM | POA: Diagnosis present

## 2015-10-22 DIAGNOSIS — Z9889 Other specified postprocedural states: Secondary | ICD-10-CM | POA: Diagnosis not present

## 2015-10-22 DIAGNOSIS — J440 Chronic obstructive pulmonary disease with acute lower respiratory infection: Principal | ICD-10-CM | POA: Diagnosis present

## 2015-10-22 DIAGNOSIS — F259 Schizoaffective disorder, unspecified: Secondary | ICD-10-CM | POA: Diagnosis present

## 2015-10-22 DIAGNOSIS — I5032 Chronic diastolic (congestive) heart failure: Secondary | ICD-10-CM | POA: Diagnosis present

## 2015-10-22 DIAGNOSIS — F319 Bipolar disorder, unspecified: Secondary | ICD-10-CM | POA: Diagnosis present

## 2015-10-22 DIAGNOSIS — K219 Gastro-esophageal reflux disease without esophagitis: Secondary | ICD-10-CM | POA: Diagnosis present

## 2015-10-22 DIAGNOSIS — Z841 Family history of disorders of kidney and ureter: Secondary | ICD-10-CM

## 2015-10-22 DIAGNOSIS — Z9981 Dependence on supplemental oxygen: Secondary | ICD-10-CM

## 2015-10-22 DIAGNOSIS — J8410 Pulmonary fibrosis, unspecified: Secondary | ICD-10-CM | POA: Diagnosis not present

## 2015-10-22 DIAGNOSIS — J441 Chronic obstructive pulmonary disease with (acute) exacerbation: Secondary | ICD-10-CM | POA: Diagnosis present

## 2015-10-22 DIAGNOSIS — R0682 Tachypnea, not elsewhere classified: Secondary | ICD-10-CM | POA: Diagnosis present

## 2015-10-22 DIAGNOSIS — J189 Pneumonia, unspecified organism: Secondary | ICD-10-CM | POA: Diagnosis present

## 2015-10-22 DIAGNOSIS — T380X5A Adverse effect of glucocorticoids and synthetic analogues, initial encounter: Secondary | ICD-10-CM | POA: Diagnosis present

## 2015-10-22 DIAGNOSIS — F840 Autistic disorder: Secondary | ICD-10-CM | POA: Diagnosis present

## 2015-10-22 LAB — URINALYSIS COMPLETE WITH MICROSCOPIC (ARMC ONLY)
Bilirubin Urine: NEGATIVE
Glucose, UA: NEGATIVE mg/dL
HGB URINE DIPSTICK: NEGATIVE
KETONES UR: NEGATIVE mg/dL
Leukocytes, UA: NEGATIVE
NITRITE: NEGATIVE
PROTEIN: 100 mg/dL — AB
SPECIFIC GRAVITY, URINE: 1.005 (ref 1.005–1.030)
Squamous Epithelial / LPF: NONE SEEN
pH: 6 (ref 5.0–8.0)

## 2015-10-22 LAB — CBC WITH DIFFERENTIAL/PLATELET
BASOS ABS: 0.1 10*3/uL (ref 0–0.1)
BASOS PCT: 1 %
EOS ABS: 0.3 10*3/uL (ref 0–0.7)
EOS PCT: 2 %
HCT: 41 % (ref 40.0–52.0)
Hemoglobin: 12.9 g/dL — ABNORMAL LOW (ref 13.0–18.0)
LYMPHS ABS: 2.4 10*3/uL (ref 1.0–3.6)
Lymphocytes Relative: 13 %
MCH: 21.6 pg — AB (ref 26.0–34.0)
MCHC: 31.5 g/dL — ABNORMAL LOW (ref 32.0–36.0)
MCV: 68.7 fL — ABNORMAL LOW (ref 80.0–100.0)
Monocytes Absolute: 1.7 10*3/uL — ABNORMAL HIGH (ref 0.2–1.0)
Monocytes Relative: 9 %
NEUTROS PCT: 75 %
Neutro Abs: 14.7 10*3/uL — ABNORMAL HIGH (ref 1.4–6.5)
PLATELETS: 380 10*3/uL (ref 150–440)
RBC: 5.96 MIL/uL — AB (ref 4.40–5.90)
RDW: 17 % — ABNORMAL HIGH (ref 11.5–14.5)
WBC: 19.3 10*3/uL — AB (ref 3.8–10.6)

## 2015-10-22 LAB — COMPREHENSIVE METABOLIC PANEL
ALT: 14 U/L — AB (ref 17–63)
AST: 18 U/L (ref 15–41)
Albumin: 2.8 g/dL — ABNORMAL LOW (ref 3.5–5.0)
Alkaline Phosphatase: 67 U/L (ref 38–126)
Anion gap: 5 (ref 5–15)
BILIRUBIN TOTAL: 0.6 mg/dL (ref 0.3–1.2)
BUN: 26 mg/dL — AB (ref 6–20)
CO2: 23 mmol/L (ref 22–32)
CREATININE: 2.36 mg/dL — AB (ref 0.61–1.24)
Calcium: 8.3 mg/dL — ABNORMAL LOW (ref 8.9–10.3)
Chloride: 109 mmol/L (ref 101–111)
GFR calc Af Amer: 41 mL/min — ABNORMAL LOW (ref >60)
GFR, EST NON AFRICAN AMERICAN: 36 mL/min — AB (ref >60)
Glucose, Bld: 120 mg/dL — ABNORMAL HIGH (ref 65–99)
Potassium: 4 mmol/L (ref 3.5–5.1)
Sodium: 137 mmol/L (ref 135–145)
TOTAL PROTEIN: 7.8 g/dL (ref 6.5–8.1)

## 2015-10-22 LAB — TROPONIN I
Troponin I: 0.1 ng/mL (ref <0.03)
Troponin I: 0.1 ng/mL (ref <0.03)

## 2015-10-22 LAB — GLUCOSE, CAPILLARY: GLUCOSE-CAPILLARY: 106 mg/dL — AB (ref 65–99)

## 2015-10-22 LAB — BRAIN NATRIURETIC PEPTIDE: B Natriuretic Peptide: 42 pg/mL (ref 0.0–100.0)

## 2015-10-22 LAB — LACTIC ACID, PLASMA
LACTIC ACID, VENOUS: 0.7 mmol/L (ref 0.5–1.9)
LACTIC ACID, VENOUS: 1 mmol/L (ref 0.5–1.9)

## 2015-10-22 LAB — CARBAMAZEPINE LEVEL, TOTAL: Carbamazepine Lvl: 4.5 ug/mL (ref 4.0–12.0)

## 2015-10-22 MED ORDER — VANCOMYCIN HCL IN DEXTROSE 1-5 GM/200ML-% IV SOLN
1000.0000 mg | Freq: Two times a day (BID) | INTRAVENOUS | Status: DC
  Administered 2015-10-23 (×2): 1000 mg via INTRAVENOUS
  Filled 2015-10-22 (×5): qty 200

## 2015-10-22 MED ORDER — SODIUM CHLORIDE 0.9 % IV BOLUS (SEPSIS)
1000.0000 mL | Freq: Once | INTRAVENOUS | Status: AC
  Administered 2015-10-22: 1000 mL via INTRAVENOUS

## 2015-10-22 MED ORDER — ALBUTEROL SULFATE (2.5 MG/3ML) 0.083% IN NEBU
2.5000 mg | INHALATION_SOLUTION | Freq: Once | RESPIRATORY_TRACT | Status: AC
Start: 2015-10-22 — End: 2015-10-22
  Administered 2015-10-22: 2.5 mg via RESPIRATORY_TRACT
  Filled 2015-10-22: qty 3

## 2015-10-22 MED ORDER — ONDANSETRON HCL 4 MG PO TABS: 4.0000 mg | ORAL_TABLET | Freq: Four times a day (QID) | ORAL | Status: DC | PRN

## 2015-10-22 MED ORDER — HEPARIN SODIUM (PORCINE) 5000 UNIT/ML IJ SOLN
5000.0000 [IU] | Freq: Three times a day (TID) | INTRAMUSCULAR | Status: DC
  Administered 2015-10-22 – 2015-10-24 (×4): 5000 [IU] via SUBCUTANEOUS
  Filled 2015-10-22 (×8): qty 1

## 2015-10-22 MED ORDER — CETYLPYRIDINIUM CHLORIDE 0.05 % MT LIQD
7.0000 mL | Freq: Two times a day (BID) | OROMUCOSAL | Status: DC
  Administered 2015-10-23 – 2015-10-25 (×6): 7 mL via OROMUCOSAL

## 2015-10-22 MED ORDER — PIPERACILLIN-TAZOBACTAM 3.375 G IVPB
3.3750 g | Freq: Three times a day (TID) | INTRAVENOUS | Status: DC
  Administered 2015-10-23 – 2015-10-26 (×11): 3.375 g via INTRAVENOUS
  Filled 2015-10-22 (×18): qty 50

## 2015-10-22 MED ORDER — QUETIAPINE FUMARATE 100 MG PO TABS
100.0000 mg | ORAL_TABLET | Freq: Every day | ORAL | Status: DC
  Administered 2015-10-23 – 2015-10-26 (×4): 100 mg via ORAL
  Filled 2015-10-22: qty 4
  Filled 2015-10-22: qty 1
  Filled 2015-10-22 (×2): qty 4

## 2015-10-22 MED ORDER — QUETIAPINE FUMARATE 300 MG PO TABS
600.0000 mg | ORAL_TABLET | Freq: Every day | ORAL | Status: DC
  Administered 2015-10-22 – 2015-10-25 (×4): 600 mg via ORAL
  Filled 2015-10-22 (×4): qty 2

## 2015-10-22 MED ORDER — VANCOMYCIN HCL IN DEXTROSE 1-5 GM/200ML-% IV SOLN
1000.0000 mg | Freq: Once | INTRAVENOUS | Status: AC
  Administered 2015-10-22: 1000 mg via INTRAVENOUS
  Filled 2015-10-22: qty 200

## 2015-10-22 MED ORDER — THIOTHIXENE 10 MG PO CAPS
10.0000 mg | ORAL_CAPSULE | Freq: Two times a day (BID) | ORAL | Status: DC
  Administered 2015-10-22 – 2015-10-26 (×8): 10 mg via ORAL
  Filled 2015-10-22 (×10): qty 1

## 2015-10-22 MED ORDER — ACETAMINOPHEN 650 MG RE SUPP: 650.0000 mg | Freq: Four times a day (QID) | RECTAL | Status: DC | PRN

## 2015-10-22 MED ORDER — METHYLPREDNISOLONE SODIUM SUCC 125 MG IJ SOLR
125.0000 mg | Freq: Once | INTRAMUSCULAR | Status: AC
  Administered 2015-10-22: 125 mg via INTRAVENOUS
  Filled 2015-10-22: qty 2

## 2015-10-22 MED ORDER — ONDANSETRON HCL 4 MG/2ML IJ SOLN: 4.0000 mg | Freq: Four times a day (QID) | INTRAMUSCULAR | Status: DC | PRN

## 2015-10-22 MED ORDER — ALBUTEROL SULFATE (2.5 MG/3ML) 0.083% IN NEBU
5.0000 mg | INHALATION_SOLUTION | Freq: Once | RESPIRATORY_TRACT | Status: AC
  Administered 2015-10-22: 5 mg via RESPIRATORY_TRACT
  Filled 2015-10-22: qty 6

## 2015-10-22 MED ORDER — CARBAMAZEPINE 200 MG PO TABS
200.0000 mg | ORAL_TABLET | Freq: Two times a day (BID) | ORAL | Status: DC
  Administered 2015-10-22 – 2015-10-26 (×8): 200 mg via ORAL
  Filled 2015-10-22 (×9): qty 1

## 2015-10-22 MED ORDER — TIOTROPIUM BROMIDE MONOHYDRATE 18 MCG IN CAPS
18.0000 ug | ORAL_CAPSULE | Freq: Every day | RESPIRATORY_TRACT | Status: DC
  Administered 2015-10-24 – 2015-10-26 (×3): 18 ug via RESPIRATORY_TRACT
  Filled 2015-10-22 (×2): qty 5

## 2015-10-22 MED ORDER — ACETAMINOPHEN 325 MG PO TABS: 650.0000 mg | ORAL_TABLET | Freq: Four times a day (QID) | ORAL | Status: DC | PRN

## 2015-10-22 MED ORDER — CHLORHEXIDINE GLUCONATE 0.12 % MT SOLN
15.0000 mL | Freq: Two times a day (BID) | OROMUCOSAL | Status: DC
  Administered 2015-10-22 – 2015-10-23 (×3): 15 mL via OROMUCOSAL
  Filled 2015-10-22 (×3): qty 15

## 2015-10-22 MED ORDER — PANTOPRAZOLE SODIUM 40 MG PO TBEC
40.0000 mg | DELAYED_RELEASE_TABLET | Freq: Two times a day (BID) | ORAL | Status: DC
  Administered 2015-10-22 – 2015-10-26 (×8): 40 mg via ORAL
  Filled 2015-10-22 (×9): qty 1

## 2015-10-22 MED ORDER — DEXTROSE 5 % IV SOLN
2.0000 g | Freq: Once | INTRAVENOUS | Status: AC
  Administered 2015-10-22: 2 g via INTRAVENOUS
  Filled 2015-10-22: qty 2

## 2015-10-22 NOTE — ED Triage Notes (Signed)
Pt via ems from home; home health nurse saw that pt was SOB and called EMS as his O2 sat was in 60's. Pt is alert & oriented. Pt on [place] upon arrival; switched to NRB as sat was 82%; upon reaching 99%, pt moved back to nasal cannula. Pt RR in 40's and 50's. Advised pt to attempt slower breathing.

## 2015-10-22 NOTE — ED Provider Notes (Addendum)
[facility] Emergency Department Provider Note   ____________________________________________   None    (approximate)  I have reviewed the triage vital signs and the nursing notes.   HISTORY  Chief Complaint Shortness of Breath    HPI Walter Wong is a 30 y.o. male with a history of CHF as well as schizophrenia who is presenting to the emergency department with 1 day of worsening shortness of breath. His caregiver at home noticed that his saturations were in the 60s so than put him on nasal cannula oxygen. However, this only resolved him up to the 80s. He was then transitioned to a nonrebreather. Once in the emergency department was transferred over to a nasal cannula at 5 L. The patient is denying any pain. Is unsure about the exact time onset of his shortness of breath but believes it started yesterday. He was also found to have a fever when he arrived in the emergency department a sepsis alert was called.  Patient was recently admitted to the hospital with a CHF exacerbation. He is also home with Spiriva and Augmentin.  Past Medical History:  Diagnosis Date  . ADHD (attention deficit hyperactivity disorder)   . Bipolar disorder (HCC)   . CHF (congestive heart failure) (HCC)    Diastolic CHF  . Chronic kidney disease    Stage 3  . Diabetes mellitus, type II (HCC)   . Interstitial lung disease (HCC)    on nocturnal home o2  . Schizophrenia [facility])     Patient Active Problem List   Diagnosis Date Noted  . Stricture and stenosis of esophagus   . Abnormal findings-gastrointestinal tract   . CHF exacerbation (HCC) 10/09/2015  . Schizoaffective disorder (HCC) 08/19/2015  . Sepsis (HCC) 08/17/2015  . Acute on chronic diastolic heart failure (HCC) 02/19/2015  . CHF (congestive heart failure) (HCC) 01/22/2015  . Heart failure (HCC) 01/22/2015  . Bipolar 1 disorder, mixed, moderate (HCC) 11/12/2014  . Bipolar disorder, current episode mixed, moderate  (HCC) 11/12/2014    Past Surgical History:  Procedure Laterality Date  . DENTAL SURGERY    . ESOPHAGOGASTRODUODENOSCOPY (EGD) WITH PROPOFOL N/A 10/12/2015   Procedure: ESOPHAGOGASTRODUODENOSCOPY (EGD) WITH PROPOFOL;  Surgeon: Midge Minium, MD;  Location: [facility] ENDOSCOPY;  Service: Endoscopy;  Laterality: N/A;    Prior to Admission medications   Medication Sig Start Date End Date Taking? Authorizing Provider  amoxicillin-clavulanate (AUGMENTIN) 875-125 MG tablet Take 1 tablet by mouth 2 (two) times daily. X 6 more days 10/13/15   Enid Baas, MD  benzonatate (TESSALON) 100 MG capsule Take 1 capsule (100 mg total) by mouth 3 (three) times daily. X 5 days 10/13/15   Enid Baas, MD  carbamazepine (TEGRETOL) 200 MG tablet Take 1 tablet (200 mg total) by mouth 2 (two) times daily. 05/13/15   Audery Amel, MD  pantoprazole (PROTONIX) 40 MG tablet Take 1 tablet (40 mg total) by mouth 2 (two) times daily. 10/13/15   Enid Baas, MD  predniSONE (STERAPRED UNI-PAK 21 TAB) 10 MG (21) TBPK tablet Take 1 tablet (10 mg total) by mouth daily. 6 tabs PO x 1 day 5 tabs PO x 1 day 4 tabs PO x 1 day 3 tabs PO x 1 day 2 tabs PO x 1 day 1 tab PO x 1 day and stop 10/13/15   Enid Baas, MD  QUEtiapine (SEROQUEL) 100 MG tablet Take 1 tablet (100 mg total) by mouth daily. 05/13/15   Audery Amel, MD  QUEtiapine (SEROQUEL)  300 MG tablet Take 2 tablets (600 mg total) by mouth at bedtime. 05/13/15   Audery AmelJohn T Clapacs, MD  thiothixene (NAVANE) 10 MG capsule Take 1 capsule (10 mg total) by mouth 2 (two) times daily. 05/13/15   Audery AmelJohn T Clapacs, MD  tiotropium (SPIRIVA HANDIHALER) 18 MCG inhalation capsule Place 1 capsule (18 mcg total) into inhaler and inhale daily. 08/30/15 08/29/16  Shane CrutchPradeep Ramachandran, MD    Allergies Review of patient's allergies indicates no known allergies.  Family History  Problem Relation Age of Onset  . Kidney failure Mother   . Congestive Heart Failure Mother     Social  History Social History  Substance Use Topics  . Smoking status: Never Smoker  . Smokeless tobacco: Never Used  . Alcohol use No    Review of Systems Constitutional: No fever/chills Eyes: No visual changes. ENT: No sore throat. Cardiovascular: Denies chest pain. Respiratory:As above Gastrointestinal: No abdominal pain.  No nausea, no vomiting.  No diarrhea.  No constipation. Genitourinary: Negative for dysuria. Musculoskeletal: Negative for back pain. Skin: Negative for rash. Neurological: Negative for headaches, focal weakness or numbness.  10-point ROS otherwise negative.  ____________________________________________   PHYSICAL EXAM:  VITAL SIGNS: ED Triage Vitals  Enc Vitals Group     BP 10/22/15 1437 130/86     Pulse Rate 10/22/15 1437 (!) 126     Resp 10/22/15 1437 (!) 52     Temp 10/22/15 1437 (!) 100.5 F (38.1 C)     Temp Source 10/22/15 1437 Oral     SpO2 10/22/15 1437 97 %     Weight 10/22/15 1437 210 lb (95.3 kg)     Height 10/22/15 1437 5\' 6"  (1.676 m)     Head Circumference --      Peak Flow --      Pain Score 10/22/15 1438 5     Pain Loc --      Pain Edu? --      Excl. in GC? --     Constitutional: Alert and oriented. Well appearing and in no acute distress But with pronounced tachypnea. He is wearing a nasal cannula. Eyes: Conjunctivae are normal. PERRL. EOMI. Head: Atraumatic. Nose: No congestion/rhinnorhea. Mouth/Throat: Mucous membranes are moist.  Oropharynx non-erythematous. Neck: No stridor.   Cardiovascular: Tachycardic, regular rhythm. Grossly normal heart sounds.  Good peripheral circulation. Respiratory: Tachypneic But speaking in full sentences. No use of accessory muscles. Severely decreased lung sounds with mild rales at the bases. No wheezing is audible. Gastrointestinal: Soft and nontender. No distention. No abdominal bruits. No CVA tenderness. Musculoskeletal: No lower extremity tenderness nor edema.  No joint  effusions. Neurologic:  Normal speech and language. No gross focal neurologic deficits are appreciated.  Skin:  Skin is warm, dry and intact. No rash noted. Psychiatric: Mood and affect are normal. Speech and behavior are normal.  ____________________________________________   LABS (all labs ordered are listed, but only abnormal results are displayed)  Labs Reviewed  CBC WITH DIFFERENTIAL/PLATELET - Abnormal; Notable for the following:       Result Value   WBC 19.3 (*)    RBC 5.96 (*)    Hemoglobin 12.9 (*)    MCV 68.7 (*)    MCH 21.6 (*)    MCHC 31.5 (*)    RDW 17.0 (*)    Neutro Abs 14.7 (*)    Monocytes Absolute 1.7 (*)    All other components within normal limits  COMPREHENSIVE METABOLIC PANEL - Abnormal; Notable for the following:  Glucose, Bld 120 (*)    BUN 26 (*)    Creatinine, Ser 2.36 (*)    Calcium 8.3 (*)    Albumin 2.8 (*)    ALT 14 (*)    GFR calc non Af Amer 36 (*)    GFR calc Af Amer 41 (*)    All other components within normal limits  TROPONIN I - Abnormal; Notable for the following:    Troponin I 0.10 (*)    All other components within normal limits  CULTURE, BLOOD (ROUTINE X 2)  CULTURE, BLOOD (ROUTINE X 2)  URINE CULTURE  BRAIN NATRIURETIC PEPTIDE  LACTIC ACID, PLASMA  CARBAMAZEPINE LEVEL, TOTAL  LACTIC ACID, PLASMA  URINALYSIS COMPLETEWITH MICROSCOPIC ([facility] ONLY)   ____________________________________________  EKG  ED ECG REPORT I, Arelia Longest, the attending physician, personally viewed and interpreted this ECG.   Date: 10/22/2015  EKG Time: 1434  Rate: 123  Rhythm: sinus tachycardia  Axis: Normal axis  Intervals:none  ST&T Change: No ST segment elevation or depression. No abnormal T-wave inversion. S wave in lead 1 as well as Q-wave in V3 are found on previous EKGs.  ____________________________________________  RADIOLOGY  DG Chest [place] 1 View (Accession [id number]) (Order [id number])  Imaging  Date: 10/22/2015  Department: [facility] EMERGENCY DEPARTMENT Released By/Authorizing: Jene Every, MD (auto-released)  PACS Images   Show images for [facility] Chest [facility] 1 View  Study Result   CLINICAL DATA:  30 year old male with a history of shortness of breath. Known interstitial lung disease  EXAM: PORTABLE CHEST 1 VIEW  COMPARISON:  CT 10/11/2015, plain film 10/11/2015  FINDINGS: Cardiomediastinal silhouette unchanged.  Low lung volumes, similar prior. Similar appearance of the reticular nodular opacities of the lower lungs bilaterally. Evidence of bronchiectasis, as was seen previously. No pneumothorax or pleural effusion.  IMPRESSION: Reticulonodular opacity of the bilateral lower lungs compatible with patient's known interstitial fibrosis. Superimposed bronchitis or pneumonia difficult to exclude. No evidence of lobar pneumonia.  Signed,  Yvone Neu. Loreta Ave, DO  [facility]  [facility] Radiology   Electronically Signed   By: Gilmer Mor D.O.   On: 10/22/2015 15:40     ____________________________________________   PROCEDURES  Procedure(s) performed:   Procedures  Critical Care performed:  CRITICAL CARE Performed by: Arelia Longest   Total critical care time: 35 minutes  Critical care time was exclusive of separately billable procedures and treating other patients.  Critical care was necessary to treat or prevent imminent or life-threatening deterioration.  Critical care was time spent personally by me on the following activities: development of treatment plan with patient and/or surrogate as well as nursing, discussions with consultants, evaluation of patient's response to treatment, examination of patient, obtaining history from patient or surrogate, ordering and performing treatments and interventions, ordering and review of laboratory studies, ordering and review of radiographic studies,  pulse oximetry and re-evaluation of patient's condition.   ____________________________________________   INITIAL IMPRESSION / ASSESSMENT AND PLAN / ED COURSE  Pertinent labs & imaging results that were available during my care of the patient were reviewed by me and considered in my medical decision making (see chart for details).  ----------------------------------------- 3:26 PM on 10/22/2015 -----------------------------------------  Decision was made to start patient on BiPAP because of his hypoxia which is continued while he is on 5 L of nasal cannula as well as his poor air movement with tachypnea. I'm concerned that he will tire and I believe that this  will help with his breathing.  Clinical Course   ----------------------------------------- 4:57 PM on 10/22/2015 -----------------------------------------  Patient is tolerating the BiPAP well. Found to have pneumonia on his chest x-ray. Will be admitted to the hospital. Discussed case with Dr. Cherlynn Kaiser of the medicine service who has accepted the patient to stepdown. The patient understands the plan for admission and is willing to comply.  ____________________________________________   FINAL CLINICAL IMPRESSION(S) / ED DIAGNOSES  HCAP, hypoxia     NEW MEDICATIONS STARTED DURING THIS VISIT:  New Prescriptions   No medications on file     Note:  This document was prepared using Dragon voice recognition software and may include unintentional dictation errors.    Myrna Blazer, MD 10/22/15 1658  Patient appears to have a chronically elevated troponin. Likely elevated 0.1 today because of demand from his sepsis.   Myrna Blazer, MD 10/22/15 [phone]

## 2015-10-22 NOTE — Progress Notes (Signed)
Per a telephone order by Dr. Cherlynn KaiserSainani, the pt. wsa taken off bipap and placed on a nasal cannula at 2 L.

## 2015-10-22 NOTE — ED Notes (Signed)
Code  Sepsis  Called  To  [facility] 

## 2015-10-22 NOTE — H&P (Signed)
[facility] - [place] at [facility]   PATIENT NAME: Walter GoldmannJason Reliford    MR#:  [id number]  DATE OF BIRTH:  03/19/1985  DATE OF ADMISSION:  10/22/2015  PRIMARY CARE PHYSICIAN: Mickel FuchsWROTH, THOMAS H, MD   REQUESTING/REFERRING PHYSICIAN: Dr. Gladstone Pihavid Schaevitz  CHIEF COMPLAINT:   Chief Complaint  Patient presents with  . Shortness of Breath    HISTORY OF PRESENT ILLNESS:  Walter Wong  is a 30 y.o. male with a known history of Schizoaffective disorder, bipolar disorder, chronic kidney disease stage III, diabetes, history of interstitial lung disease, ADHD, who was recently discharged from the hospital due to respiratory failure secondary to diastolic CHF and also aspiration who presents to the hospital secondary to shortness of breath. She says that he developed a cough late last night which was significantly worse and productive with clear sputum. He became very short of breath this morning and therefore came to the ER for further evaluation. In the emergency room patient was noted to be in acute on chronic respiratory failure with hypoxia and hospitalist services were contacted further treatment and evaluation. Patient denies any chest pain, nausea, vomiting, abdominal pain, fever, chills or any other associated symptoms presently.  PAST MEDICAL HISTORY:   Past Medical History:  Diagnosis Date  . ADHD (attention deficit hyperactivity disorder)   . Bipolar disorder (HCC)   . CHF (congestive heart failure) (HCC)    Diastolic CHF  . Chronic kidney disease    Stage 3  . Diabetes mellitus, type II (HCC)   . Interstitial lung disease (HCC)    on nocturnal home o2  . Schizophrenia (HCC)     PAST SURGICAL HISTORY:   Past Surgical History:  Procedure Laterality Date  . DENTAL SURGERY    . ESOPHAGOGASTRODUODENOSCOPY (EGD) WITH PROPOFOL N/A 10/12/2015   Procedure: ESOPHAGOGASTRODUODENOSCOPY (EGD) WITH PROPOFOL;  Surgeon: Midge Miniumarren Wohl, MD;  Location: [facility] ENDOSCOPY;  Service: Endoscopy;   Laterality: N/A;    SOCIAL HISTORY:   Social History  Substance Use Topics  . Smoking status: Never Smoker  . Smokeless tobacco: Never Used  . Alcohol use No    FAMILY HISTORY:   Family History  Problem Relation Age of Onset  . Kidney failure Mother   . Congestive Heart Failure Mother     DRUG ALLERGIES:  No Known Allergies  REVIEW OF SYSTEMS:   Review of Systems  Constitutional: Negative for fever and weight loss.  HENT: Negative for congestion, nosebleeds and tinnitus.   Eyes: Negative for blurred vision, double vision and redness.  Respiratory: Positive for cough and shortness of breath. Negative for hemoptysis.   Cardiovascular: Negative for chest pain, orthopnea, leg swelling and PND.  Gastrointestinal: Negative for abdominal pain, diarrhea, melena, nausea and vomiting.  Genitourinary: Negative for dysuria, hematuria and urgency.  Musculoskeletal: Negative for falls and joint pain.  Neurological: Negative for dizziness, tingling, sensory change, focal weakness, seizures, weakness and headaches.  Endo/Heme/Allergies: Negative for polydipsia. Does not bruise/bleed easily.  Psychiatric/Behavioral: Negative for depression and memory loss. The patient is not nervous/anxious.     MEDICATIONS AT HOME:   Prior to Admission medications   Medication Sig Start Date End Date Taking? Authorizing Provider  amoxicillin-clavulanate (AUGMENTIN) 875-125 MG tablet Take 1 tablet by mouth 2 (two) times daily. X 6 more days 10/13/15   Enid Baasadhika Kalisetti, MD  benzonatate (TESSALON) 100 MG capsule Take 1 capsule (100 mg total) by mouth 3 (three) times daily. X 5 days 10/13/15   Enid Baasadhika Kalisetti, MD  carbamazepine (TEGRETOL) 200 MG tablet Take 1 tablet (200 mg total) by mouth 2 (two) times daily. 05/13/15   Audery Amel, MD  pantoprazole (PROTONIX) 40 MG tablet Take 1 tablet (40 mg total) by mouth 2 (two) times daily. 10/13/15   Enid Baas, MD  predniSONE (STERAPRED UNI-PAK 21 TAB) 10 MG  (21) TBPK tablet Take 1 tablet (10 mg total) by mouth daily. 6 tabs PO x 1 day 5 tabs PO x 1 day 4 tabs PO x 1 day 3 tabs PO x 1 day 2 tabs PO x 1 day 1 tab PO x 1 day and stop 10/13/15   Enid Baas, MD  QUEtiapine (SEROQUEL) 100 MG tablet Take 1 tablet (100 mg total) by mouth daily. 05/13/15   Audery Amel, MD  QUEtiapine (SEROQUEL) 300 MG tablet Take 2 tablets (600 mg total) by mouth at bedtime. 05/13/15   Audery Amel, MD  thiothixene (NAVANE) 10 MG capsule Take 1 capsule (10 mg total) by mouth 2 (two) times daily. 05/13/15   Audery Amel, MD  tiotropium (SPIRIVA HANDIHALER) 18 MCG inhalation capsule Place 1 capsule (18 mcg total) into inhaler and inhale daily. 08/30/15 08/29/16  Shane Crutch, MD      VITAL SIGNS:  Blood pressure (!) 133/96, pulse (!) 105, temperature (!) 100.5 F (38.1 C), temperature source Oral, resp. rate (!) 51, height  (1.676 m), weight 94 kg (207 lb 4.8 oz), SpO2 100 %.  PHYSICAL EXAMINATION:  Physical Exam  GENERAL:  30 y.o.-year-old patient lying in the bed Tachypnic and in mild resp. Distress.  EYES: Pupils equal, round, reactive to light and accommodation. No scleral icterus. Extraocular muscles intact.  HEENT: Head atraumatic, normocephalic. Oropharynx and nasopharynx clear. No oropharyngeal erythema, moist oral mucosa  NECK:  Supple, no jugular venous distention. No thyroid enlargement, no tenderness.  LUNGS: Normal breath sounds bilaterally, no wheezing, rhonchi, bibasilar crackles. No use of accessory muscles of respiration.  CARDIOVASCULAR: S1, S2 RRR, Tachycardic. No murmurs, rubs, gallops, clicks.  ABDOMEN: Soft, nontender, nondistended. Bowel sounds present. No organomegaly or mass.  EXTREMITIES: No pedal edema, cyanosis, or clubbing. + 2 pedal & radial pulses b/l.   NEUROLOGIC: Cranial nerves II through XII are intact. No focal Motor or sensory deficits appreciated b/l PSYCHIATRIC: The patient is alert and oriented x 3. Good affect.   SKIN: No obvious rash, lesion, or ulcer.   LABORATORY PANEL:   CBC  Recent Labs Lab 10/22/15 1444  WBC 19.3*  HGB 12.9*  HCT 41.0  PLT 380   ------------------------------------------------------------------------------------------------------------------  Chemistries   Recent Labs Lab 10/22/15 1553  NA 137  K 4.0  CL 109  CO2 23  GLUCOSE 120*  BUN 26*  CREATININE 2.36*  CALCIUM 8.3*  AST 18  ALT 14*  ALKPHOS 67  BILITOT 0.6   ------------------------------------------------------------------------------------------------------------------  Cardiac Enzymes  Recent Labs Lab 10/22/15 1553  TROPONINI 0.10*   ------------------------------------------------------------------------------------------------------------------  RADIOLOGY:  Dg Chest Port 1 View  Result Date: 10/22/2015 CLINICAL DATA:  30 year old male with a history of shortness of breath. Known interstitial lung disease EXAM: PORTABLE CHEST 1 VIEW COMPARISON:  CT 10/11/2015, plain film 10/11/2015 FINDINGS: Cardiomediastinal silhouette unchanged. Low lung volumes, similar prior. Similar appearance of the reticular nodular opacities of the lower lungs bilaterally. Evidence of bronchiectasis, as was seen previously. No pneumothorax or pleural effusion. IMPRESSION: Reticulonodular opacity of the bilateral lower lungs compatible with patient's known interstitial fibrosis. Superimposed bronchitis or pneumonia difficult to exclude. No evidence of lobar  pneumonia. Signed, Yvone Neu. Loreta Ave, DO [facility] Electronically Signed   By: Gilmer Mor D.O.   On: 10/22/2015 15:40     IMPRESSION AND PLAN:   30 year old male with past medical history of schizoaffective disorder, bipolar disorder, chronic kidney disease stage III, diabetes type 2, CHF who presents to the hospital due to shortness of breath.  1. Acute on chronic respiratory failure with  hypoxia-secondary to suspected pneumonia. -Initially patient was placed on BiPAP in the emergency room but will wean off that and placed on nasal cannula. -Continue treatment for pneumonia with broad-spectrum IV antibiotics with vancomycin and Zosyn.  2. Pneumonia-suspected to be nosocomial given his recent hospitalization. -We'll give IV vancomycin, Zosyn. Follow blood, sputum cultures.  3. History of schizoaffective disorder, continue Seroquel, Tegretol, thiothixene  4. COPD-no acute exacerbation. Continue Spiriva.  5. GERD-continue Protonix.  6. Leukocytosis-secondary to pneumonia and also due to steroids that the patient just finished. We'll follow white cell count.    All the records are reviewed and case discussed with ED provider. Management plans discussed with the patient, family and they are in agreement.  CODE STATUS: Full Code   TOTAL TIME TAKING CARE OF THIS PATIENT: 45 minutes.    Houston Siren M.D on 10/22/2015 at 5:20 PM  Between 7am to 6pm - Pager - [phone]  After 6pm go to [email or web address] - password [email or web address] [facility]  [place] [facility]  Office  [phone]  CC: Primary care physician; Mickel Fuchs, MD   `

## 2015-10-22 NOTE — Progress Notes (Signed)
Notified Dr.Sainani of patient's respiratory status, patient is tachypnec and anxious. Continuous Bipap was being applied by the respiratory therapist at 50%. Patient has orders to transfer to step down.

## 2015-10-22 NOTE — Consult Note (Signed)
Pharmacy Antibiotic Note  Walter Wong is a 30 y.o. male admitted on 10/22/2015 with pneumonia.  Pharmacy has been consulted for vancomycin and zosyn dosing.  Plan: Patient received 1 g of vancomycin in the ED. Will give next dose in 6 hours for stacked dosing Vancomycin 1000mg  IV every 12 hours.  Goal trough 15-20 mcg/mL. Zosyn 3.375g IV q8h (4 hour infusion).  Trough at steady state 8/13 @ 0900  Height: 5\' 6"  (167.6 cm) Weight: 207 lb 4.8 oz (94 kg) IBW/kg (Calculated) : 63.8  Temp (24hrs), Avg:100.5 F (38.1 C), Min:100.5 F (38.1 C), Max:100.5 F (38.1 C)   Recent Labs Lab 10/22/15 1444 10/22/15 1553  WBC 19.3*  --   CREATININE  --  2.36*  LATICACIDVEN  --  0.7    Estimated Creatinine Clearance: 49.6 mL/min (by C-G formula based on SCr of 2.36 mg/dL).    No Known Allergies  Antimicrobials this admission: vancomycin 8/11 >>  cefepmine 8/11 >> one dose Zosyn 8/11>>  Dose adjustments this admission:   Microbiology results: 8/11 BCx:    Thank you for allowing pharmacy to be a part of this patient's care.  Walter Wong 10/22/2015 5:41 PM

## 2015-10-23 DIAGNOSIS — J9621 Acute and chronic respiratory failure with hypoxia: Secondary | ICD-10-CM

## 2015-10-23 LAB — BASIC METABOLIC PANEL
ANION GAP: 8 (ref 5–15)
BUN: 27 mg/dL — AB (ref 6–20)
CHLORIDE: 111 mmol/L (ref 101–111)
CO2: 19 mmol/L — ABNORMAL LOW (ref 22–32)
Calcium: 8.4 mg/dL — ABNORMAL LOW (ref 8.9–10.3)
Creatinine, Ser: 2.14 mg/dL — ABNORMAL HIGH (ref 0.61–1.24)
GFR calc Af Amer: 46 mL/min — ABNORMAL LOW (ref >60)
GFR, EST NON AFRICAN AMERICAN: 40 mL/min — AB (ref >60)
GLUCOSE: 125 mg/dL — AB (ref 65–99)
POTASSIUM: 4.7 mmol/L (ref 3.5–5.1)
Sodium: 138 mmol/L (ref 135–145)

## 2015-10-23 LAB — TROPONIN I
TROPONIN I: 0.06 ng/mL — AB (ref <0.03)
TROPONIN I: 0.08 ng/mL — AB (ref <0.03)
Troponin I: 0.05 ng/mL (ref <0.03)

## 2015-10-23 LAB — CBC
HEMATOCRIT: 37.3 % — AB (ref 40.0–52.0)
HEMOGLOBIN: 12.2 g/dL — AB (ref 13.0–18.0)
MCH: 22.4 pg — ABNORMAL LOW (ref 26.0–34.0)
MCHC: 32.8 g/dL (ref 32.0–36.0)
MCV: 68.3 fL — AB (ref 80.0–100.0)
Platelets: 345 10*3/uL (ref 150–440)
RBC: 5.46 MIL/uL (ref 4.40–5.90)
RDW: 16 % — ABNORMAL HIGH (ref 11.5–14.5)
WBC: 12 10*3/uL — AB (ref 3.8–10.6)

## 2015-10-23 LAB — MRSA PCR SCREENING: MRSA by PCR: NEGATIVE

## 2015-10-23 MED ORDER — GUAIFENESIN-CODEINE 100-10 MG/5ML PO SOLN
5.0000 mL | ORAL | Status: DC | PRN
  Administered 2015-10-23 – 2015-10-25 (×4): 5 mL via ORAL
  Filled 2015-10-23 (×4): qty 5

## 2015-10-23 MED ORDER — BUDESONIDE 0.5 MG/2ML IN SUSP
0.5000 mg | Freq: Two times a day (BID) | RESPIRATORY_TRACT | Status: DC
  Administered 2015-10-23 – 2015-10-26 (×7): 0.5 mg via RESPIRATORY_TRACT
  Filled 2015-10-23 (×7): qty 2

## 2015-10-23 MED ORDER — MORPHINE SULFATE (PF) 2 MG/ML IV SOLN
2.0000 mg | INTRAVENOUS | Status: DC | PRN
  Administered 2015-10-23: 2 mg via INTRAVENOUS
  Filled 2015-10-23: qty 1

## 2015-10-23 MED ORDER — ALBUTEROL SULFATE (2.5 MG/3ML) 0.083% IN NEBU
2.5000 mg | INHALATION_SOLUTION | RESPIRATORY_TRACT | Status: DC
  Administered 2015-10-23 – 2015-10-25 (×11): 2.5 mg via RESPIRATORY_TRACT
  Filled 2015-10-23 (×9): qty 3

## 2015-10-23 MED ORDER — METHYLPREDNISOLONE SODIUM SUCC 40 MG IJ SOLR
40.0000 mg | Freq: Two times a day (BID) | INTRAMUSCULAR | Status: AC
  Administered 2015-10-23 – 2015-10-25 (×6): 40 mg via INTRAVENOUS
  Filled 2015-10-23 (×6): qty 1

## 2015-10-23 MED ORDER — FUROSEMIDE 10 MG/ML IJ SOLN
40.0000 mg | Freq: Once | INTRAMUSCULAR | Status: AC
  Administered 2015-10-23: 40 mg via INTRAVENOUS
  Filled 2015-10-23: qty 4

## 2015-10-23 NOTE — Consult Note (Signed)
Pharmacy Antibiotic Note  Walter Wong is a 30 y.o. male admitted on 10/22/2015 with pneumonia.  Pharmacy has been consulted for vancomycin and zosyn dosing.  Plan: Vancomycin 1000mg  IV every 12 hours.  Goal trough 15-20 mcg/mL. Zosyn 3.375g IV q8h (4 hour infusion).  Trough at steady state 8/13 @ 0900. Will f/u with attending MD plans for vancomycin with negative MRSA PCR.   Height: 5\' 6"  (167.6 cm) Weight: 207 lb 4.8 oz (94 kg) IBW/kg (Calculated) : 63.8  Temp (24hrs), Avg:98.6 F (37 C), Min:97.7 F (36.5 C), Max:100.5 F (38.1 C)   Recent Labs Lab 10/22/15 1444 10/22/15 1553 10/22/15 1858 10/23/15 0318  WBC 19.3*  --   --  12.0*  CREATININE  --  2.36*  --  2.14*  LATICACIDVEN  --  0.7 1.0  --     Estimated Creatinine Clearance: 54.7 mL/min (by C-G formula based on SCr of 2.14 mg/dL).    No Known Allergies  Antimicrobials this admission: vancomycin 8/11 >>  cefepmine 8/11 >> one dose Zosyn 8/11>>  Dose adjustments this admission:   Microbiology results: 8/11 BCx: NGTD x 2 8/11 MRSA PCR: negative 8/11 UCx: pending   Thank you for allowing pharmacy to be a part of this patient's care.  Luisa HartChristy, Roseanne Juenger D 10/23/2015 12:17 PM

## 2015-10-23 NOTE — Progress Notes (Signed)
[employer] [facility] [place] [facility]   PATIENT NAME: Gennaro Lizotte    MR#:  [id number]  DATE OF BIRTH:  1985/04/18  SUBJECTIVE:   Patient here due to acute on chronic respiratory failure with hypoxia secondary to pneumonia. Was on BiPAP yesterday but now improved and off BiPAP this a.m. Seen by pulmonary this morning and started on IV steroids and remains on broad-spectrum IV antibiotics. Still a little bit tachypneic.  REVIEW OF SYSTEMS:    Review of Systems  Constitutional: Negative for chills and fever.  HENT: Negative for congestion and tinnitus.   Eyes: Negative for blurred vision and double vision.  Respiratory: Positive for cough and shortness of breath. Negative for wheezing.   Cardiovascular: Negative for chest pain, orthopnea and PND.  Gastrointestinal: Negative for abdominal pain, diarrhea, nausea and vomiting.  Genitourinary: Negative for dysuria and hematuria.  Neurological: Negative for dizziness, sensory change and focal weakness.  All other systems reviewed and are negative.   Nutrition: Regular Tolerating Diet: Yes Tolerating PT: Ambulatory     DRUG ALLERGIES:  No Known Allergies  VITALS:  Blood pressure 118/82, pulse 90, temperature 98.1 F (36.7 C), temperature source Oral, resp. rate (!) 41, height  (1.676 m), weight 94 kg (207 lb 4.8 oz), SpO2 100 %.  PHYSICAL EXAMINATION:   Physical Exam  GENERAL:  30 y.o.-year-old patient lying in the bed in mild resp. Distress.  EYES: Pupils equal, round, reactive to light and accommodation. No scleral icterus. Extraocular muscles intact.  HEENT: Head atraumatic, normocephalic. Oropharynx and nasopharynx clear.  NECK:  Supple, no jugular venous distention. No thyroid enlargement, no tenderness.  LUNGS: Normal breath sounds bilaterally, no wheezing, rhonchi, bibasilar crackles. No use of accessory muscles of respiration.  CARDIOVASCULAR: S1, S2 normal. No murmurs, rubs, or gallops.  ABDOMEN:  Soft, nontender, nondistended. Bowel sounds present. No organomegaly or mass.  EXTREMITIES: No cyanosis, clubbing or edema b/l.    NEUROLOGIC: Cranial nerves II through XII are intact. No focal Motor or sensory deficits b/l.   PSYCHIATRIC: The patient is alert and oriented x 3.  SKIN: No obvious rash, lesion, or ulcer.    LABORATORY PANEL:   CBC  Recent Labs Lab 10/23/15 0318  WBC 12.0*  HGB 12.2*  HCT 37.3*  PLT 345   ------------------------------------------------------------------------------------------------------------------  Chemistries   Recent Labs Lab 10/22/15 1553 10/23/15 0318  NA 137 138  K 4.0 4.7  CL 109 111  CO2 23 19*  GLUCOSE 120* 125*  BUN 26* 27*  CREATININE 2.36* 2.14*  CALCIUM 8.3* 8.4*  AST 18  --   ALT 14*  --   ALKPHOS 67  --   BILITOT 0.6  --    ------------------------------------------------------------------------------------------------------------------  Cardiac Enzymes  Recent Labs Lab 10/23/15 0659  TROPONINI 0.05*   ------------------------------------------------------------------------------------------------------------------  RADIOLOGY:  Dg Chest Port 1 View  Result Date: 10/22/2015 CLINICAL DATA:  30 year old male with a history of shortness of breath. Known interstitial lung disease EXAM: PORTABLE CHEST 1 VIEW COMPARISON:  CT 10/11/2015, plain film 10/11/2015 FINDINGS: Cardiomediastinal silhouette unchanged. Low lung volumes, similar prior. Similar appearance of the reticular nodular opacities of the lower lungs bilaterally. Evidence of bronchiectasis, as was seen previously. No pneumothorax or pleural effusion. IMPRESSION: Reticulonodular opacity of the bilateral lower lungs compatible with patient's known interstitial fibrosis. Superimposed bronchitis or pneumonia difficult to exclude. No evidence of lobar pneumonia. Signed, Yvone Neu. Loreta Ave, DO [facility] Radiology  Electronically Signed   By: Marijean Niemann  Loreta AveWagner D.O.   On: 10/22/2015 15:40     ASSESSMENT AND PLAN:   30 year old male with past medical history of schizoaffective disorder, bipolar disorder, chronic kidney disease stage III, diabetes type 2, CHF who presents to the hospital due to shortness of breath.  1. Acute on chronic respiratory failure with hypoxia-secondary to suspected pneumonia with underlying chronic ILD/Fibrosis.  - appreciate Pulmonary input.  Weaned off Bipap now.  - Started on IV steroids aper Pulm.  Cont. Zosyn for pneumonia. D/C Vanc as MRSA Screen (-) - pt. On chronic O2 at home.  - may need referral to tertiary care center for Lung Transplant but likely not a good candidate given his psych history.  2. Pneumonia-suspected to be nosocomial given his recent hospitalization. - MRSA Screen (-).  D/c Vanc.  Cont. Zosyn. Follow cultures.   3. History of schizoaffective disorder, continue Seroquel, Tegretol, thiothixene  4. COPD- no exacerbation.  - seen by Pulm. Today and started on IV steroids, cont. Duonebs, Spiriva.   5. GERD-continue Protonix.  6. Leukocytosis-secondary to pneumonia.  Trending down.   All the records are reviewed and case discussed with Care Management/Social Workerr. Management plans discussed with the patient, family and they are in agreement.  CODE STATUS: Full   DVT Prophylaxis: Heparin SQ  TOTAL TIME TAKING CARE OF THIS PATIENT: 30 minutes.   POSSIBLE D/C IN 2-3 DAYS, DEPENDING ON CLINICAL CONDITION.   Houston SirenSAINANI,VIVEK J M.D on 10/23/2015 at 2:52 PM  Between 7am to 6pm - Pager - [phone]  After 6pm go to [email or web address] - password [email or web address] [facility]  [facility]  Office  [phone]  CC: Primary care physician; Mickel FuchsWROTH, THOMAS H, MD

## 2015-10-23 NOTE — Progress Notes (Signed)
Pt.'s mother, who is the primary caretaker called to check in on pt.'s status.  Discussed that pt. Was still on the Bipap machine but was in good spirits, resting in bed watching TV.  Pt.'s VSS, but still tachypneic.  Pt.'s mother stated that due to pt.'s baseline autism and psych PMHx his baseline breathing rate is usually elevated. Pt.'s mother was also concerned that the pt. Had not slept the previous evening even with his normal nighttime medications.  When asked if there is anything the pt. Is given at home when he is unable to sleep, mother stated "2 Bendaryls". This RN stated that if pt. Is unable to sleep after nighttime medications this would be discussed with the MD on call.

## 2015-10-23 NOTE — Consult Note (Signed)
[facility]       Date: 10/23/2015  MRN# [id number] Walter Wong 07/05/1985  Referring Physician: Dr. Nemiah CommanderKalisetti.   Walter Wong is a 30 y.o. old male seen in consultation for chief complaint of:    Chief Complaint  Patient presents with  . Shortness of Breath    HPI:   The patient is a 30 year old male with a history of developmental disability, and autism. The patient has a history of COPD and interstitial lung disease. We have seen him in the past inpatient as well as in the outpatient setting. He also has bronchiectasis changes related to interstitial lung disease. The etiology of his lung disease is uncertain, given his developmental disability, and likely poor tolerance for any procedures, in consultation with his mother decided not to opt for any interventional diagnostic procedures or aggressive therapies. He also has a history of FSGS with CK D stage III, ADHD, schizophrenia and bipolar disorder, diabetes mellitus not on insulin, diastolic congestive heart failure.   Currently, the patient is very sleepy therefore, all history is obtained from the chart and from staff.  Presents to the hospital with complaints worsening SOB, increased WOB with worsening hypoxia   Review of chest x-ray films and CT chest findings c/w    PMHX:   Past Medical History:  Diagnosis Date  . ADHD (attention deficit hyperactivity disorder)   . Bipolar disorder (HCC)   . CHF (congestive heart failure) (HCC)    Diastolic CHF  . Chronic kidney disease    Stage 3  . Diabetes mellitus, type II (HCC)   . Interstitial lung disease (HCC)    on nocturnal home o2  . Schizophrenia [facility](HCC)    Surgical Hx:  Past Surgical History:  Procedure Laterality Date  . DENTAL SURGERY    . ESOPHAGOGASTRODUODENOSCOPY (EGD) WITH PROPOFOL N/A 10/12/2015   Procedure: ESOPHAGOGASTRODUODENOSCOPY (EGD) WITH PROPOFOL;  Surgeon: Midge Miniumarren Wohl, MD;  Location: [facility] ENDOSCOPY;  Service:  Endoscopy;  Laterality: N/A;   Family Hx:  Family History  Problem Relation Age of Onset  . Kidney failure Mother   . Congestive Heart Failure Mother    Social Hx:   Social History  Substance Use Topics  . Smoking status: Never Smoker  . Smokeless tobacco: Never Used  . Alcohol use No   Medication:    Reviewed.    Allergies:  Review of patient's allergies indicates no known allergies.  Review of Systems:  Review of Systems:  Gen:  Denies  fever,    +sweats,  +chills  HEENT: Denies blurred vision, double vision, ear pain, eye pain, hearing loss, nose bleeds, sore throat  Cardiac:  No dizziness, chest pain or heaviness, chest tightness,edema  Resp:  + shortness of breath,+wheezing, -hemoptysis,   Gi: Denies swallowing difficulty, stomach pain, nausea or vomiting, diarrhea, constipation, bowel incontinence   Ext:   Denies Joint pain, stiffness or swelling  Skin: Denies  skin rash, easy bruising or bleeding or hives  Endoc:  Denies polyuria, polydipsia , polyphagia or weight change  Psych:   Denies depression, insomnia or hallucinations   Other:  All other systems negative   Physical Examination:   VS: BP 118/82   Pulse 90   Temp 98.1 F (36.7 C) (Oral)   Resp (!) 41   Ht 5\' 6"  (1.676 m)   Wt 207 lb 4.8 oz (94 kg)   SpO2 100%   BMI 33.46 kg/m   General Appearance: + distress  Neuro:without focal  findings,  speech normal,  HEENT: PERRLA, EOM intact.   Pulmonary: +bilateral wheezing and crepitations heard diffusely. CardiovascularNormal S1,S2.  No m/r/g.   Abdomen: Benign, Soft, non-tender. Renal:  No costovertebral tenderness  GU:  No performed at this time. Endoc: No evident thyromegaly, no signs of acromegaly. Skin:   warm, no rashes, no ecchymosis  Extremities: normal, no cyanosis, clubbing.  Other findings:    LABORATORY PANEL:   CBC  Recent Labs Lab 10/23/15 0318  WBC 12.0*  HGB 12.2*  HCT 37.3*  PLT 345    ------------------------------------------------------------------------------------------------------------------  Chemistries   Recent Labs Lab 10/22/15 1553 10/23/15 0318  NA 137 138  K 4.0 4.7  CL 109 111  CO2 23 19*  GLUCOSE 120* 125*  BUN 26* 27*  CREATININE 2.36* 2.14*  CALCIUM 8.3* 8.4*  AST 18  --   ALT 14*  --   ALKPHOS 67  --   BILITOT 0.6  --    ------------------------------------------------------------------------------------------------------------------  Cardiac Enzymes  Recent Labs Lab 10/23/15 0659  TROPONINI 0.05*   ------------------------------------------------------------  RADIOLOGY:  Dg Chest Port 1 View  Result Date: 10/22/2015 CLINICAL DATA:  30 year old male with a history of shortness of breath. Known interstitial lung disease EXAM: PORTABLE CHEST 1 VIEW COMPARISON:  CT 10/11/2015, plain film 10/11/2015 FINDINGS: Cardiomediastinal silhouette unchanged. Low lung volumes, similar prior. Similar appearance of the reticular nodular opacities of the lower lungs bilaterally. Evidence of bronchiectasis, as was seen previously. No pneumothorax or pleural effusion. IMPRESSION: Reticulonodular opacity of the bilateral lower lungs compatible with patient's known interstitial fibrosis. Superimposed bronchitis or pneumonia difficult to exclude. No evidence of lobar pneumonia. Signed, Yvone Neu. Loreta Ave, DO [facility] Radiology Electronically Signed   By: Gilmer Mor D.O.   On: 10/22/2015 15:40     Assessment and Plan:  30 yo AAM admitted for acute and severe hypoxic rep failure in setting of chronic hypoxic resp failure with severe underlying ILD NSIP/fibrosis with h/o CHF and CKD, pneumonia with pulm edema with exacerbation of fibrosis  Acute respiratory failure, likely secondary to acute bronchitis/edema/pneumonia- -continue zosyn, start iv steroids -continue Spiriva, aggressive alb nebs -advance  activity, wean down oxygen as tolerated.  Chronic respiratory failure secondary to interstitial lung disease. -discussed with mother on previous admission as well as outpatient visit, given his other comorbidities, we have opted not to proceed with that any invasive workup of his interstitial lung disease but rather treat conservatively. -wil lneed referral for l ung transplant however patient has psychiatric issues  Developmental disability/autism. -as above. This complicates therapy given the patient's inability to understand many therapies instructions and interventions.  History of congestive heart failure. -treatment is indicated,we'll monitor as this likely exacerbates the patient's respiratory status.   I have personally obtained a history, examined the patient, evaluated Pertinent laboratory and RadioGraphic/imaging results, and  formulated the assessment and plan   The Patient requires high complexity decision making for assessment and support, frequent evaluation and titration of therapies.   Patient with very poor prognosis, I would recommend discussion with patient mother and proceed with DNR/DNI status  Cordaryl Decelles Santiago Glad, M.D.  [facility]  Medical Director [facility] Medical Director [facility] Cardio-Pulmonary Department

## 2015-10-23 NOTE — Progress Notes (Signed)
Initial Nutrition Assessment  DOCUMENTATION CODES:   Obesity unspecified  INTERVENTION:  -Cater to pt preferences; pt may benefit from Carb Modified/Heart Healthy Diet but will need to discuss diet further with Mother.  -Pt would likely benefit from smaller, more frequent meals due to respiratory status   NUTRITION DIAGNOSIS:   Increased nutrient needs related to chronic illness as evidenced by estimated needs.  GOAL:   Patient will meet greater than or equal to 90% of their needs   MONITOR:   PO intake, Labs, Weight trends  REASON FOR ASSESSMENT:   Malnutrition Screening Tool    ASSESSMENT:   30 yo male admitted with acute on chronic respiratory failure with pneumonia; pt with hx of interstitial lung disease, noted per MD pt needs referral for lung transplant. Pt has developmental disability/autism as well   SOB on exertion, de-sats during conversation today.  Pt reports good appetite at home, eating well. Pt reports no changes in weight; weight relatively stable per weight encounters. Pt reports he lives with his Mother, sister-in-law does all the cooking though. Family not at bedside on visit today  Nutrition-Focused physical exam completed. Findings are WDL for fat depletion, muscle depletion, and edema.    Diet Order:  Diet regular Room service appropriate? Yes; Fluid consistency: Thin  Skin:  Reviewed, no issues  Last BM:  8/12  Labs: Creatinine 2.14, BUN 27  Glucose Profile:   Recent Labs  10/22/15 2106  GLUCAP 106*    Meds: solumedrol  Height:   Ht Readings from Last 1 Encounters:  10/22/15 5\' 6"  (1.676 m)    Weight:  Wt Readings from Last 1 Encounters:  10/22/15 207 lb 4.8 oz (94 kg)   Weights/Temps       Wt Readings from Last 10 Encounters:  10/22/15 207 lb 4.8 oz (94 kg)  10/09/15 210 lb (95.3 kg)  08/30/15 210 lb (95.3 kg)  08/17/15 186 lb (84.4 kg)  05/13/15 216 lb 12.8 oz (98.3 kg)  01/23/15 205 lb 11.7 oz (93.3 kg)  11/12/14  217 lb (98.4 kg)       BMI:  Body mass index is 33.46 kg/m.  Estimated Nutritional Needs:   Kcal:  2300-2800 kcals   Protein:  94-112 g  Fluid:  >/= 1.9 L  EDUCATION NEEDS:   No education needs identified at this time  Romelle StarcherCate Caitlen Worth MS, RD, LDN [phone] Pager  [phone] Weekend/On-Call Pager

## 2015-10-23 NOTE — Progress Notes (Signed)
Pt. Rested well O/N, VSS, labs stable, I & O's stable.  Pt. Taken off Bipap by 2230, titrated down to 2 L [place]. Pt.'s mother stated intermittent tachypnea is baseline. No complications.

## 2015-10-24 DIAGNOSIS — J8410 Pulmonary fibrosis, unspecified: Secondary | ICD-10-CM

## 2015-10-24 LAB — BASIC METABOLIC PANEL
Anion gap: 7 (ref 5–15)
BUN: 25 mg/dL — AB (ref 6–20)
CALCIUM: 8.9 mg/dL (ref 8.9–10.3)
CHLORIDE: 107 mmol/L (ref 101–111)
CO2: 23 mmol/L (ref 22–32)
CREATININE: 2.22 mg/dL — AB (ref 0.61–1.24)
GFR calc non Af Amer: 38 mL/min — ABNORMAL LOW (ref >60)
GFR, EST AFRICAN AMERICAN: 44 mL/min — AB (ref >60)
Glucose, Bld: 131 mg/dL — ABNORMAL HIGH (ref 65–99)
Potassium: 4.4 mmol/L (ref 3.5–5.1)
SODIUM: 137 mmol/L (ref 135–145)

## 2015-10-24 LAB — MAGNESIUM: MAGNESIUM: 1.9 mg/dL (ref 1.7–2.4)

## 2015-10-24 LAB — CBC
HCT: 35.1 % — ABNORMAL LOW (ref 40.0–52.0)
Hemoglobin: 11.2 g/dL — ABNORMAL LOW (ref 13.0–18.0)
MCH: 21.9 pg — AB (ref 26.0–34.0)
MCHC: 31.9 g/dL — AB (ref 32.0–36.0)
MCV: 68.6 fL — ABNORMAL LOW (ref 80.0–100.0)
PLATELETS: 337 10*3/uL (ref 150–440)
RBC: 5.12 MIL/uL (ref 4.40–5.90)
RDW: 16.5 % — AB (ref 11.5–14.5)
WBC: 11.7 10*3/uL — ABNORMAL HIGH (ref 3.8–10.6)

## 2015-10-24 LAB — URINE CULTURE

## 2015-10-24 LAB — PHOSPHORUS: PHOSPHORUS: 4.3 mg/dL (ref 2.5–4.6)

## 2015-10-24 NOTE — Consult Note (Signed)
Pharmacy Antibiotic Note  Walter Wong is a 30 y.o. male admitted on 10/22/2015 with pneumonia.  Pharmacy has been consulted for vancomycin and zosyn dosing. MRSA PCR negative and vancomycin d/c.   Plan: Continue Zosyn 3.375g IV q8h (4 hour infusion).   Height: 5\' 6"  (167.6 cm) Weight: 207 lb 4.8 oz (94 kg) IBW/kg (Calculated) : 63.8  Temp (24hrs), Avg:98.3 F (36.8 C), Min:98.1 F (36.7 C), Max:98.4 F (36.9 C)   Recent Labs Lab 10/22/15 1444 10/22/15 1553 10/22/15 1858 10/23/15 0318 10/24/15 0441  WBC 19.3*  --   --  12.0* 11.7*  CREATININE  --  2.36*  --  2.14* 2.22*  LATICACIDVEN  --  0.7 1.0  --   --     Estimated Creatinine Clearance: 52.7 mL/min (by C-G formula based on SCr of 2.22 mg/dL).    No Known Allergies  Antimicrobials this admission: vancomycin 8/11 >> 8/12 cefepmine 8/11 >> one dose Zosyn 8/11>>  Dose adjustments this admission:   Microbiology results: 8/11 BCx: NGTD x 2 8/11 MRSA PCR: negative 8/11 UCx: insignificant growth   Thank you for allowing pharmacy to be a part of this patient's care.  Luisa HartChristy, Trenton Verne D 10/24/2015 12:24 PM

## 2015-10-24 NOTE — Progress Notes (Signed)
[facility]       Date: 10/24/2015  MRN# [id number] Walter AntisJason A Whitt 29/15/1987  Referring Physician: Dr. Nemiah CommanderKalisetti.   Walter AntisJason A Wong is a 30 y.o. old male seen in consultation for chief complaint of:    Chief Complaint  Patient presents with  . Shortness of Breath    HPI:   The patient is a 30 year old male with a history of developmental disability, and autism. The patient has a history of COPD and interstitial lung disease. We have seen him in the past inpatient as well as in the outpatient setting. He also has bronchiectasis changes related to interstitial lung disease. The etiology of his lung disease is uncertain, given his developmental disability, and likely poor tolerance for any procedures, in consultation with his mother decided not to opt for any interventional diagnostic procedures or aggressive therapies. He also has a history of FSGS with CK D stage III, ADHD, schizophrenia and bipolar disorder, diabetes mellitus not on insulin, diastolic congestive heart failure.   Currently, the patient is very sleepy therefore, all history is obtained from the chart and from staff.  Presents to the hospital with complaints worsening SOB, increased WOB with worsening hypoxia   Review of chest x-ray films and CT chest findings c/w   SUBJECTIVE: No acute issues overnight. Started on IS and flutter valve.  Coughing spells, SOB, increased WOB, seems to be at baseline  Physical Examination:   VS: BP 105/80   Pulse 91   Temp 98.4 F (36.9 C) (Oral)   Resp (!) 31   Ht 5\' 6"  (1.676 m)   Wt 207 lb 4.8 oz (94 kg)   SpO2 100%   BMI 33.46 kg/m   General Appearance: Mild respiratory distress  Neuro: AAO X4, speech normal, moves all extremities HEENT: PERRLA, EOM intact.   Pulmonary: Breath sounds bilateral in upper lung fields, significantly diminished in the bases. Mild expiratory wheezes Cardiovascular: RRR, S1,S2.  No m/r/g.   Abdomen: Soft,  non-tender, normal bowel sounds. Renal:  No costovertebral tenderness  GU:  No performed at this time. Endoc: No evident thyromegaly, no signs of acromegaly. Skin:   warm, no rashes, no ecchymosis  Extremities: +2 pulses, no cyanosis, clubbing.  LABORATORY PANEL:   CBC  Recent Labs Lab 10/24/15 0441  WBC 11.7*  HGB 11.2*  HCT 35.1*  PLT 337   ------------------------------------------------------------------------------------------------------------------  Chemistries   Recent Labs Lab 10/22/15 1553  10/24/15 0441  NA 137  < > 137  K 4.0  < > 4.4  CL 109  < > 107  CO2 23  < > 23  GLUCOSE 120*  < > 131*  BUN 26*  < > 25*  CREATININE 2.36*  < > 2.22*  CALCIUM 8.3*  < > 8.9  MG  --   --  1.9  AST 18  --   --   ALT 14*  --   --   ALKPHOS 67  --   --   BILITOT 0.6  --   --   < > = values in this interval not displayed. ------------------------------------------------------------------------------------------------------------------  Cardiac Enzymes  Recent Labs Lab 10/23/15 0659  TROPONINI 0.05*   ------------------------------------------------------------  RADIOLOGY:  Dg Chest Port 1 View  Result Date: 10/22/2015 CLINICAL DATA:  30 year old male with a history of shortness of breath. Known interstitial lung disease EXAM: PORTABLE CHEST 1 VIEW COMPARISON:  CT 10/11/2015, plain film 10/11/2015 FINDINGS: Cardiomediastinal silhouette unchanged. Low lung volumes, similar prior. Similar appearance  of the reticular nodular opacities of the lower lungs bilaterally. Evidence of bronchiectasis, as was seen previously. No pneumothorax or pleural effusion. IMPRESSION: Reticulonodular opacity of the bilateral lower lungs compatible with patient's known interstitial fibrosis. Superimposed bronchitis or pneumonia difficult to exclude. No evidence of lobar pneumonia. Signed, Yvone Neu. Loreta Ave, DO [facility] Radiology Electronically  Signed   By: Gilmer Mor D.O.   On: 10/22/2015 15:40    Assessment and Plan:  30 yo AAM admitted for acute and severe hypoxic rep failure in setting of chronic hypoxic resp failure with severe underlying ILD NSIP/fibrosis with h/o CHF and CKD, pneumonia with pulm edema with exacerbation of fibrosis  Acute respiratory failure, likely secondary to acute bronchitis/edema/pneumonia- -continue zosyn -Continue iv steroids -Continue Spiriva and duoneb nebulizer treatments -Advance activity, wean down oxygen as tolerated. -prn expectorant  Chronic respiratory failure secondary to interstitial lung disease. -Continue respiratory support as above. -Patient will need referral for lung transplant evaluation however given his psychiatric history, he will probably not be a candidate  Developmental disability/autism. -Psyche following.  -This complicates therapy given the patient's inability to understand many therapies instructions and interventions. -Continue all home medications   History of congestive heart failure. -Hemodynamics per ICU protocol -IV diuresis  Family update: No family at bedside. Will update once available.   Best Practice: Code Status:  Full. Diet: regular diet GI prophylaxis:  PPI. VTE prophylaxis:  SCD's / heparin.  Magdalene S. [facility] ANP-BC Pulmonary and Critical Care Medicine [facility] Pager [phone] or [phone]   STAFF NOTE: I, Dr. Lucie Leather,  have personally reviewed patient's available data, including medical history, events of note, physical examination and test results as part of my evaluation. I have discussed with NP and other care providers such as pharmacist, RN and RRT.   Overall, prognosis is very poor, I recommend Palliative care consult, recommend DNR/DNI status  [place] per NP whose note is outlined above and that I agree with  I have personally reviewed/obtained a history, examined the patient, evaluated Pertinent  laboratory and RadioGraphic/imaging results, and  formulated the assessment and plan   The Patient requires high complexity decision making for assessment and support, frequent evaluation and titration of therapies, application of advanced monitoring technologies and extensive interpretation of multiple databases.    prognosis is guarded.  Patient with Multiorgan dysfunction. Prognosis is very poor    Lucie Leather, M.D.  [facility]  Medical Director [facility] Medical Director [facility] Cardio-Pulmonary Department

## 2015-10-24 NOTE — Progress Notes (Signed)
I spoke with Dr Acquanetta BellingJohn Reynolds with [facility], and after further discussion, it seems that patient will need Lung and Kidney transplant which will lead to  extensive medical and surgical intervention and pateint would not survive this procedure and will likely not leave the hospital. Also due to his mental and psychiatric issues as well as social issues, he is NOT a candidate for transplant.  If family wishes to see someone at [facility] for official consult, family can call [phone].   Will relay this information to family when available.   Lucie LeatherKurian David Faraaz Wolin, M.D.  [facility]  Medical Director [facility] Medical Director [facility] Cardio[facility] Department

## 2015-10-24 NOTE — Progress Notes (Signed)
[employer] - [place] at [facility]   PATIENT NAME: Walter GoldmannJason Wong    MR#:  [id number]  DATE OF BIRTH:  03/15/1985  SUBJECTIVE:   Patient here due to acute on chronic respiratory failure with hypoxia secondary to pneumonia. Remains tachypnea, off BiPAP since yesterday. No other acute events overnight.  REVIEW OF SYSTEMS:    Review of Systems  Constitutional: Negative for chills and fever.  HENT: Negative for congestion and tinnitus.   Eyes: Negative for blurred vision and double vision.  Respiratory: Positive for cough and shortness of breath. Negative for wheezing.   Cardiovascular: Negative for chest pain, orthopnea and PND.  Gastrointestinal: Negative for abdominal pain, diarrhea, nausea and vomiting.  Genitourinary: Negative for dysuria and hematuria.  Neurological: Negative for dizziness, sensory change and focal weakness.  All other systems reviewed and are negative.   Nutrition: Regular Tolerating Diet: Yes Tolerating PT: Ambulatory     DRUG ALLERGIES:  No Known Allergies  VITALS:  Blood pressure 105/80, pulse 91, temperature 98.4 F (36.9 C), temperature source Oral, resp. rate (!) 31, height 5\' 6"  (1.676 m), weight 94 kg (207 lb 4.8 oz), SpO2 98 %.  PHYSICAL EXAMINATION:   Physical Exam  GENERAL:  30 y.o.-year-old patient lying in the bed in mild resp. Distress.  EYES: Pupils equal, round, reactive to light and accommodation. No scleral icterus. Extraocular muscles intact.  HEENT: Head atraumatic, normocephalic. Oropharynx and nasopharynx clear.  NECK:  Supple, no jugular venous distention. No thyroid enlargement, no tenderness.  LUNGS: Poor air entry bilaterally, shallow breaths, no wheezing, rhonchi, bibasilar crackles. No use of accessory muscles of respiration.  CARDIOVASCULAR: S1, S2 normal. Tachycardic No murmurs, rubs, or gallops.  ABDOMEN: Soft, nontender, nondistended. Bowel sounds present. No organomegaly or mass.  EXTREMITIES: No  cyanosis, clubbing or edema b/l.    NEUROLOGIC: Cranial nerves II through XII are intact. No focal Motor or sensory deficits b/l.   PSYCHIATRIC: The patient is alert and oriented x 3.  SKIN: No obvious rash, lesion, or ulcer.    LABORATORY PANEL:   CBC  Recent Labs Lab 10/24/15 0441  WBC 11.7*  HGB 11.2*  HCT 35.1*  PLT 337   ------------------------------------------------------------------------------------------------------------------  Chemistries   Recent Labs Lab 10/22/15 1553  10/24/15 0441  NA 137  < > 137  K 4.0  < > 4.4  CL 109  < > 107  CO2 23  < > 23  GLUCOSE 120*  < > 131*  BUN 26*  < > 25*  CREATININE 2.36*  < > 2.22*  CALCIUM 8.3*  < > 8.9  MG  --   --  1.9  AST 18  --   --   ALT 14*  --   --   ALKPHOS 67  --   --   BILITOT 0.6  --   --   < > = values in this interval not displayed. ------------------------------------------------------------------------------------------------------------------  Cardiac Enzymes  Recent Labs Lab 10/23/15 0659  TROPONINI 0.05*   ------------------------------------------------------------------------------------------------------------------  RADIOLOGY:  Dg Chest Port 1 View  Result Date: 10/22/2015 CLINICAL DATA:  30 year old male with a history of shortness of breath. Known interstitial lung disease EXAM: PORTABLE CHEST 1 VIEW COMPARISON:  CT 10/11/2015, plain film 10/11/2015 FINDINGS: Cardiomediastinal silhouette unchanged. Low lung volumes, similar prior. Similar appearance of the reticular nodular opacities of the lower lungs bilaterally. Evidence of bronchiectasis, as was seen previously. No pneumothorax or pleural effusion. IMPRESSION: Reticulonodular opacity of the bilateral lower lungs compatible with  patient's known interstitial fibrosis. Superimposed bronchitis or pneumonia difficult to exclude. No evidence of lobar pneumonia. Signed, Yvone Neu. Loreta Ave, DO [facility]  [facility] Electronically Signed   By: Gilmer Mor D.O.   On: 10/22/2015 15:40     ASSESSMENT AND PLAN:   30 year old male with past medical history of schizoaffective disorder, bipolar disorder, chronic kidney disease stage III, diabetes type 2, CHF who presents to the hospital due to shortness of breath.  1. Acute on chronic respiratory failure with hypoxia-secondary to suspected pneumonia with underlying chronic ILD/Fibrosis.  - appreciate Pulmonary input.  Remains off Bipap. - cont. IV steroids as per Pulm.  Cont. Zosyn for pneumonia. Off Vanc. - pt. On chronic O2 at home.  - may need referral to tertiary care center for Lung Transplant but likely not a good candidate given his psych history.  2. Pneumonia-suspected to be nosocomial given his recent hospitalization. - MRSA Screen (-).  Cont. Zosyn. Off Vanc. Follow cultures.   3. History of schizoaffective disorder - continue Seroquel, Tegretol, thiothixene  4. COPD- mild exacerbation.  - seen by Pulm.  Cont. IV steroids, cont. Duonebs, Spiriva.   5. GERD-continue Protonix.  6. Leukocytosis-secondary to pneumonia.  Trending down.   Possible [facility] to [facility] center for Lung Transplant per Pulm. Recommendations.    All the records are reviewed and case discussed with Care Management/Social Workerr. Management plans discussed with the patient, family and they are in agreement.  CODE STATUS: Full   DVT Prophylaxis: Heparin SQ  TOTAL TIME TAKING CARE OF THIS PATIENT: 25 minutes.   POSSIBLE D/C IN 2-3 DAYS, DEPENDING ON CLINICAL CONDITION.   Houston Siren M.D on 10/24/2015 at 1:56 PM  Between 7am to 6pm - Pager - [phone]  After 6pm go to [email or web address] - password [email or web address] [facility]  [place] [facility]  Office  [phone]  CC: Primary care physician; Mickel Fuchs, MD

## 2015-10-25 ENCOUNTER — Ambulatory Visit: Payer: Medicare Other | Admitting: Family

## 2015-10-25 LAB — BASIC METABOLIC PANEL
Anion gap: 7 (ref 5–15)
BUN: 24 mg/dL — AB (ref 6–20)
CALCIUM: 9.1 mg/dL (ref 8.9–10.3)
CO2: 25 mmol/L (ref 22–32)
CREATININE: 2.03 mg/dL — AB (ref 0.61–1.24)
Chloride: 106 mmol/L (ref 101–111)
GFR calc Af Amer: 49 mL/min — ABNORMAL LOW (ref >60)
GFR, EST NON AFRICAN AMERICAN: 43 mL/min — AB (ref >60)
GLUCOSE: 115 mg/dL — AB (ref 65–99)
Potassium: 4.4 mmol/L (ref 3.5–5.1)
Sodium: 138 mmol/L (ref 135–145)

## 2015-10-25 LAB — CBC
HCT: 37.2 % — ABNORMAL LOW (ref 40.0–52.0)
Hemoglobin: 12 g/dL — ABNORMAL LOW (ref 13.0–18.0)
MCH: 22.2 pg — AB (ref 26.0–34.0)
MCHC: 32.2 g/dL (ref 32.0–36.0)
MCV: 69.1 fL — AB (ref 80.0–100.0)
PLATELETS: 357 10*3/uL (ref 150–440)
RBC: 5.39 MIL/uL (ref 4.40–5.90)
RDW: 16.5 % — AB (ref 11.5–14.5)
WBC: 11.1 10*3/uL — AB (ref 3.8–10.6)

## 2015-10-25 MED ORDER — PREDNISONE 10 MG PO TABS: 5.0000 mg | ORAL_TABLET | Freq: Every day | ORAL | Status: DC

## 2015-10-25 MED ORDER — ALBUTEROL SULFATE (2.5 MG/3ML) 0.083% IN NEBU
2.5000 mg | INHALATION_SOLUTION | RESPIRATORY_TRACT | Status: DC | PRN
  Administered 2015-10-26: 2.5 mg via RESPIRATORY_TRACT
  Filled 2015-10-25: qty 3

## 2015-10-25 MED ORDER — PREDNISONE 20 MG PO TABS: 20.0000 mg | ORAL_TABLET | Freq: Every day | ORAL | Status: DC

## 2015-10-25 MED ORDER — PREDNISONE 20 MG PO TABS: 30.0000 mg | ORAL_TABLET | Freq: Every day | ORAL | Status: DC

## 2015-10-25 MED ORDER — PREDNISONE 20 MG PO TABS
40.0000 mg | ORAL_TABLET | Freq: Every day | ORAL | Status: DC
  Administered 2015-10-26: 40 mg via ORAL
  Filled 2015-10-25: qty 2

## 2015-10-25 MED ORDER — PREDNISONE 10 MG PO TABS: 10.0000 mg | ORAL_TABLET | Freq: Every day | ORAL | Status: DC

## 2015-10-25 NOTE — Progress Notes (Addendum)
Mother requested update at ph [phone]. Called request number x 3, no answer.  Stephanie AcreVishal Chaynce Schafer, MD [place] [facility] Pager [phone]- [phone] (please enter 7-digits) On Call Pager - [phone] (please enter 7-digits)   Addendum@1 .31pm Mother called back - she was updated on the clinical status of the patient and he is not a candidate for lung/kidney transplant. She stated that patient has anxiety and does not handle "bad news" well, therefore requested that since patient has limited understanding and intellect, that all medical updates be discussed with her first.     Stephanie AcreVishal Abhiram Criado, MD  Pulmonary and Critical Care Pager [phone]- [phone] (please enter 7-digits) On Call Pager - [phone] (please enter 7-digits)

## 2015-10-25 NOTE — Consult Note (Signed)
Pharmacy Antibiotic Note  Walter Wong is a 30 y.o. male admitted on 10/22/2015 with pneumonia.  Pharmacy has been consulted for vancomycin and zosyn dosing. MRSA PCR negative and vancomycin d/c.   Plan: Continue Zosyn 3.375g IV q8h (4 hour infusion). Patient is on day 3 of Zosyn. After discussion with Dr. Dema SeverinMungal, will continue Zosyn for a total of 5 days.   Height: 5\' 6"  (167.6 cm) Weight: 207 lb 4.8 oz (94 kg) IBW/kg (Calculated) : 63.8  Temp (24hrs), Avg:98 F (36.7 C), Min:97.8 F (36.6 C), Max:98.2 F (36.8 C)   Recent Labs Lab 10/22/15 1444 10/22/15 1553 10/22/15 1858 10/23/15 0318 10/24/15 0441 10/25/15 0323  WBC 19.3*  --   --  12.0* 11.7* 11.1*  CREATININE  --  2.36*  --  2.14* 2.22* 2.03*  LATICACIDVEN  --  0.7 1.0  --   --   --     Estimated Creatinine Clearance: 57.6 mL/min (by C-G formula based on SCr of 2.03 mg/dL).    No Known Allergies  Antimicrobials this admission: vancomycin 8/11 >> 8/12 cefepmine 8/11 >> one dose Zosyn 8/11>> 8/16  Dose adjustments this admission:   Microbiology results: 8/11 BCx: NGTD x 2 8/11 MRSA PCR: negative 8/11 UCx: insignificant growth   Thank you for allowing pharmacy to be a part of this patient's care.  Luisa HartChristy, Traevion Poehler D 10/25/2015 11:18 AM

## 2015-10-25 NOTE — Care Management (Signed)
Attempted to reach parent/aunt at [phone]; emergency number for aunt was disconnected. I understand that patient is currently being [facility] for [facility] (nursing)  and home O2 and that patient lives with his mother. There are currently no plans for patient to transfer to another hospital. [facility] aware of patient admission.

## 2015-10-25 NOTE — Progress Notes (Addendum)
[facility]       Date: 10/25/2015  MRN# [id number] Walter AntisJason A Centner 05/13/1985  Referring Physician: Dr. Nemiah CommanderKalisetti.   Walter Wong is a 30 y.o. old male seen in consultation for chief complaint of:    Chief Complaint  Patient presents with  . Shortness of Breath    HPI:   The patient is a 30 year old male with a history of developmental disability, and autism. The patient has a history of COPD and interstitial lung disease. We have seen him in the past inpatient as well as in the outpatient setting. He also has bronchiectasis changes related to interstitial lung disease. The etiology of his lung disease is uncertain, given his developmental disability, and likely poor tolerance for any procedures, in consultation with his mother decided not to opt for any interventional diagnostic procedures or aggressive therapies. He also has a history of FSGS with CK D stage III, ADHD, schizophrenia and bipolar disorder, diabetes mellitus not on insulin, diastolic congestive heart failure.   Presents to the hospital with complaints worsening SOB, increased WOB with worsening hypoxia   Review of chest x-ray films and CT chest findings c/w   SUBJECTIVE: No acute issues overnight. Started on IS and flutter valve.  Today with good clinical improvement, speaking in complete sentences with no significant desaturations or shortness of breath. Nasal cannula and weaned down to 2.5 L  Physical Examination:   VS: BP (!) 132/92   Pulse 90   Temp 98.2 F (36.8 C) (Oral)   Resp (!) 27   Ht 5\' 6"  (1.676 m)   Wt 207 lb 4.8 oz (94 kg)   SpO2 100%   BMI 33.46 kg/m   General Appearance: Mild respiratory distress  Neuro: AAO X4, speech normal, moves all extremities HEENT: PERRLA, EOM intact.   Pulmonary: Breath sounds bilateral in upper lung fields,mild diminished in the bases. Very Mild expiratory wheezes Cardiovascular: RRR, S1,S2.  No m/r/g.   Abdomen: Soft, non-tender,  normal bowel sounds. Renal:  No costovertebral tenderness  GU:  No performed at this time. Endoc: No evident thyromegaly, no signs of acromegaly. Skin:   warm, no rashes, no ecchymosis  Extremities: +2 pulses, no cyanosis, clubbing.  LABORATORY PANEL:   CBC  Recent Labs Lab 10/25/15 0323  WBC 11.1*  HGB 12.0*  HCT 37.2*  PLT 357   ------------------------------------------------------------------------------------------------------------------  Chemistries   Recent Labs Lab 10/22/15 1553  10/24/15 0441 10/25/15 0323  NA 137  < > 137 138  K 4.0  < > 4.4 4.4  CL 109  < > 107 106  CO2 23  < > 23 25  GLUCOSE 120*  < > 131* 115*  BUN 26*  < > 25* 24*  CREATININE 2.36*  < > 2.22* 2.03*  CALCIUM 8.3*  < > 8.9 9.1  MG  --   --  1.9  --   AST 18  --   --   --   ALT 14*  --   --   --   ALKPHOS 67  --   --   --   BILITOT 0.6  --   --   --   < > = values in this interval not displayed. ------------------------------------------------------------------------------------------------------------------  Cardiac Enzymes  Recent Labs Lab 10/23/15 0659  TROPONINI 0.05*   ------------------------------------------------------------  RADIOLOGY:  No results found.  Assessment and Plan:  30 yo AAM admitted for acute and severe hypoxic rep failure in setting of chronic hypoxic resp  failure with severe underlying ILD NSIP/fibrosis with h/o CHF and CKD, pneumonia with pulm edema with exacerbation of fibrosis  Acute respiratory failure, likely secondary to acute bronchitis/edema/pneumonia- - rapid improvement with steroids.  -continue zosyn -Continue iv steroids (prolong wean 15 days). Further steroid discussions as an outpatient,.  -Continue Spiriva and duoneb nebulizer treatments -[facility] activity, wean down oxygen as tolerated. -prn expectorant - Dr. Belia HemanKasa has discussed case with [facility] (Lung [facility] Kidney) - patient is not a candidate for transplant  -  Robitussin with codeine for cough - now significantly improved, weaned as tolerated.  - Pulmonary office will schedule f/u with Dr. Dereck Leepamachandaran  Chronic respiratory failure secondary to interstitial lung disease/Acute ILD flare. -Continue respiratory support as above.  Developmental disability/autism. -Psych following.  -This complicates therapy given the patient's inability to understand many therapies instructions and interventions. -Continue all home medications   History of congestive heart failure. -Hemodynamics per ICU protocol -IV diuresis  Family update: No family at bedside. Will update once available.   Best Practice: Code Status:  Full. Diet: regular diet GI prophylaxis:  PPI. VTE prophylaxis:  SCD's / heparin.  Thank you for consulting [place] [facility], we will signoff at this time.  Please feel free to contact us with any questions at [phone] (please enter 7-digits).   I have personally reviewed/obtained a history, examined the patient, evaluated Pertinent laboratory and RadioGraphic/imaging results, and  formulated the assessment and plan   The Patient requires high complexity decision making for assessment and support, frequent evaluation and titration of therapies, application of advanced monitoring technologies and extensive interpretation of multiple databases.     Stephanie AcreVishal Contessa Preuss, MD [place] [facility] Pager [phone]- [phone] (please enter 7-digits) On Call Pager - [phone] (please enter 7-digits)

## 2015-10-25 NOTE — Care Management (Signed)
Again, attempted to reach patient's mother at [phone]- was not able to leave message. [facility] updated that patient may discharge to tomorrow. RNCM to continue to follow.

## 2015-10-25 NOTE — Progress Notes (Signed)
Report was given to Maralyn SagoSarah RN via telephone regarding patient condition. RN acknowledge transfer order.

## 2015-10-25 NOTE — Progress Notes (Signed)
Patient moved to room 207 by bed with Chasity, NT and Britta MccreedyBarbara, Charity fundraiserN.  Patient transferred to new room on o2 and tele monitor.  Alert and smiling when leaving ICU.

## 2015-10-25 NOTE — Progress Notes (Signed)
RN made Dr. Cherlynn KaiserSainani aware that patient's diastolic blood pressure has been elevated low to upper 90's this morning.  MD acknowledged and gave order for patient to be transferred to any floor with tele.

## 2015-10-25 NOTE — Progress Notes (Addendum)
[employer] - [place] [facility]   PATIENT NAME: Walter Wong    MR#:  [id number]  DATE OF BIRTH:  04/17/1985  SUBJECTIVE:   Patient here due to acute on chronic respiratory failure with hypoxia secondary to pneumonia. Shortness of breath improved.  Now down to 2L [place].  Not a candidate for Lung Transplant as per Pulmonary.    REVIEW OF SYSTEMS:    Review of Systems  Constitutional: Negative for chills and fever.  HENT: Negative for congestion and tinnitus.   Eyes: Negative for blurred vision and double vision.  Respiratory: Positive for cough and shortness of breath. Negative for wheezing.   Cardiovascular: Negative for chest pain, orthopnea and PND.  Gastrointestinal: Negative for abdominal pain, diarrhea, nausea and vomiting.  Genitourinary: Negative for dysuria and hematuria.  Neurological: Negative for dizziness, sensory change and focal weakness.  All other systems reviewed and are negative.   Nutrition: Regular Tolerating Diet: Yes Tolerating PT: Ambulatory   DRUG ALLERGIES:  No Known Allergies  VITALS:  Blood pressure (!) 125/91, pulse 81, temperature 98 F (36.7 C), temperature source Oral, resp. rate (!) 27, height 5\' 6"  (1.676 m), weight 94 kg (207 lb 4.8 oz), SpO2 100 %.  PHYSICAL EXAMINATION:   Physical Exam  GENERAL:  30 y.o.-year-old patient lying in the bed in NAD.   EYES: Pupils equal, round, reactive to light and accommodation. No scleral icterus. Extraocular muscles intact.  HEENT: Head atraumatic, normocephalic. Oropharynx and nasopharynx clear.  NECK:  Supple, no jugular venous distention. No thyroid enlargement, no tenderness.  LUNGS: Poor air entry bilaterally, shallow breaths, no wheezing, rhonchi, bibasilar crackles. No use of accessory muscles of respiration.  CARDIOVASCULAR: S1, S2 normal. Tachycardic No murmurs, rubs, or gallops.  ABDOMEN: Soft, nontender, nondistended. Bowel sounds present. No organomegaly or mass.   EXTREMITIES: No cyanosis, clubbing or edema b/l.    NEUROLOGIC: Cranial nerves II through XII are intact. No focal Motor or sensory deficits b/l.   PSYCHIATRIC: The patient is alert and oriented x 3.  SKIN: No obvious rash, lesion, or ulcer.    LABORATORY PANEL:   CBC  Recent Labs Lab 10/25/15 0323  WBC 11.1*  HGB 12.0*  HCT 37.2*  PLT 357   ------------------------------------------------------------------------------------------------------------------  Chemistries   Recent Labs Lab 10/22/15 1553  10/24/15 0441 10/25/15 0323  NA 137  < > 137 138  K 4.0  < > 4.4 4.4  CL 109  < > 107 106  CO2 23  < > 23 25  GLUCOSE 120*  < > 131* 115*  BUN 26*  < > 25* 24*  CREATININE 2.36*  < > 2.22* 2.03*  CALCIUM 8.3*  < > 8.9 9.1  MG  --   --  1.9  --   AST 18  --   --   --   ALT 14*  --   --   --   ALKPHOS 67  --   --   --   BILITOT 0.6  --   --   --   < > = values in this interval not displayed. ------------------------------------------------------------------------------------------------------------------  Cardiac Enzymes  Recent Labs Lab 10/23/15 0659  TROPONINI 0.05*   ------------------------------------------------------------------------------------------------------------------  RADIOLOGY:  No results found.   ASSESSMENT AND PLAN:   30 year old male with past medical history of schizoaffective disorder, bipolar disorder, chronic kidney disease stage III, diabetes type 2, CHF who presents to the hospital due to shortness of breath.  1. Acute on chronic respiratory  failure with hypoxia-secondary to suspected pneumonia with underlying chronic ILD/Fibrosis.  - appreciate Pulmonary input.  Remains off Bipap. - on IV Steroids and will taper to PO Pred. As per Pulm.  - Cont. Zosyn for pneumonia. Off Vanc. - pt. On chronic O2 at home.  - as per Pulmonary pt. Is Patient cannot take deep breaths due to his illness and therefore flutter valve is ineffective.  In  addition to that, his secretions are too thick and tenacious to respond to flutter valve and suctioning therapy.    2. Pneumonia-suspected to be nosocomial given his recent hospitalization. - MRSA Screen (-).  Cont. Zosyn. Off Vanc. Cultures so far (-).   3. History of schizoaffective disorder - continue Seroquel, Tegretol, thiothixene  4. COPD- mild exacerbation.  - appreciate Pulm. Input, wean steroids to PO Pred,  cont. Duonebs, Spiriva.   5. GERD-continue Protonix.  6. Leukocytosis-secondary to pneumonia.   - improving and will monitor.     Transfer to floor, ambulate today and if doing well then d/c home tomorrow.    All the records are reviewed and case discussed with Care Management/Social Workerr. Management plans discussed with the patient, family and they are in agreement.  CODE STATUS: Full   DVT Prophylaxis: Heparin SQ  TOTAL TIME TAKING CARE OF THIS PATIENT: 25 minutes.   POSSIBLE D/C IN 1-2 DAYS, DEPENDING ON CLINICAL CONDITION.   Houston SirenSAINANI,[name] J M.D on 10/25/2015 at 2:27 PM  Between 7am to 6pm - Pager - [phone]  After 6pm go to [email or web address] - password [email or web address] [facility]  [facility]  Office  [phone]  CC: Primary care physician; Mickel FuchsWROTH, THOMAS H, MD

## 2015-10-26 ENCOUNTER — Telehealth: Payer: Self-pay | Admitting: *Deleted

## 2015-10-26 MED ORDER — PREDNISONE 10 MG PO TABS: ORAL_TABLET | ORAL | 0 refills | Status: DC

## 2015-10-26 MED ORDER — AMOXICILLIN-POT CLAVULANATE 875-125 MG PO TABS: 1.0000 | ORAL_TABLET | Freq: Two times a day (BID) | ORAL | 0 refills | Status: DC

## 2015-10-26 NOTE — Care Management (Signed)
Patient to discharge home today.  Aunt to transport at discharge and bring portable tank.  Resumption of home health orders for nursing placed.  Barbara CowerJason with [facility] notified.  All needed documentation for aflovest obtained and submitted to [facility].  Patient to be fitted for vest at home after discharge, and vest to be delivered to home.  RNCM signing off.

## 2015-10-26 NOTE — Telephone Encounter (Signed)
Tried to call pt to schedule hosp f/u with DR in 2-3 weeks. No answer and VM available. Will call later.

## 2015-10-26 NOTE — Discharge Summary (Signed)
[facility] - [place] at [facility]   PATIENT NAME: Walter Wong    MR#:  [id number]  DATE OF BIRTH:  1985-04-18  DATE OF ADMISSION:  10/22/2015 ADMITTING PHYSICIAN: Houston Siren, MD  DATE OF DISCHARGE: 10/26/2015 11:47 AM  PRIMARY CARE PHYSICIAN: Mickel Fuchs, MD    ADMISSION DIAGNOSIS:  Hypoxia [R09.02] HCAP (healthcare-associated pneumonia) [J18.9]  DISCHARGE DIAGNOSIS:  Active Problems:   Acute on chronic respiratory failure with hypoxia (HCC)   SECONDARY DIAGNOSIS:   Past Medical History:  Diagnosis Date  . ADHD (attention deficit hyperactivity disorder)   . Bipolar disorder (HCC)   . CHF (congestive heart failure) (HCC)    Diastolic CHF  . Chronic kidney disease    Stage 3  . Diabetes mellitus, type II (HCC)   . Interstitial lung disease (HCC)    on nocturnal home o2  . Schizophrenia [facility])     HOSPITAL COURSE:   30 year old male with past medical history of schizoaffective disorder, bipolar disorder, chronic kidney disease stage III, diabetes type 2, CHF who presents to the hospital due to shortness of breath.  1. Acute on chronic respiratory failure with hypoxia- this was secondary to suspected pneumonia with underlying chronic ILD/Fibrosis.  -Patient was admitted to the intensive care unit and maintained on BiPAP intermittently. He was also given IV steroids, empiric antibiotics with IV vancomycin and Zosyn and eventually narrowed down to just Zosyn. -A pulmonary consult was obtained. She has chronic lung disease based on their evaluation and they considered transfer to a tertiary care center for possible lung transplant therapy but he is not a good candidate for it given his psychiatric history. -After aggressive pulmonary toileting and treatment as mentioned above he has been weaned off down to his baseline oxygen at 2 L. -He is therefore being discharged on oral Augmentin and a long prednisone taper as stated below. He will continue his  chronic oxygen at 2 L continuously.  2. Pneumonia-suspected to be nosocomial given his recent hospitalization. - MRSA Screen (-).   in the hospital patient was maintained on IV Zosyn, but not being discharged on oral Augmentin for additional 7 days.  3. History of schizoaffective disorder - he will continue Seroquel, Tegretol, thiothixene  4. COPD- this was a mild exacerbation due to pneumonia.   -Patient was seen by pulmonary and started on IV steroids, DuoNeb's, Spiriva and has improved with aggressive therapy now being discharged in a long prednisone taper and empiric antibiotics as mentioned. -We will continue his chronic oxygen at 2 L  5. GERD- he will continue Protonix.  Pt. Was discharged home with Home Health Nursing.    DISCHARGE CONDITIONS:   Full Code  CONSULTS OBTAINED:    DRUG ALLERGIES:  No Known Allergies  DISCHARGE MEDICATIONS:     Medication List    TAKE these medications   amoxicillin-clavulanate 875-125 MG tablet Commonly known as:  AUGMENTIN Take 1 tablet by mouth 2 (two) times daily.   carbamazepine 200 MG tablet Commonly known as:  TEGRETOL Take 1 tablet (200 mg total) by mouth 2 (two) times daily.   diphenhydrAMINE 25 MG tablet Commonly known as:  BENADRYL Take 25 mg by mouth at bedtime as needed for sleep.   pantoprazole 40 MG tablet Commonly known as:  PROTONIX Take 1 tablet (40 mg total) by mouth 2 (two) times daily.   predniSONE 10 MG tablet Commonly known as:  DELTASONE Label  & dispense according to the schedule below. 4 Pills PO  for 3 days, 3 Pills PO for 3 days, 2 Pills PO for 3 days, 1 Pill PO for 3 days then STOP.   QUEtiapine 300 MG tablet Commonly known as:  SEROQUEL Take 2 tablets (600 mg total) by mouth at bedtime.   QUEtiapine 100 MG tablet Commonly known as:  SEROQUEL Take 1 tablet (100 mg total) by mouth daily.   thiothixene 10 MG capsule Commonly known as:  NAVANE Take 1 capsule (10 mg total) by mouth 2 (two)  times daily.   tiotropium 18 MCG inhalation capsule Commonly known as:  SPIRIVA HANDIHALER Place 1 capsule (18 mcg total) into inhaler and inhale daily.         DISCHARGE INSTRUCTIONS:   DIET:  Regular diet  DISCHARGE CONDITION:  Stable  ACTIVITY:  Activity as tolerated  OXYGEN:  Home Oxygen: Yes.     Oxygen Delivery: 2 liters/min via Patient connected to nasal cannula oxygen  DISCHARGE LOCATION:  Home with Home Health nursing.    If you experience worsening of your admission symptoms, develop shortness of breath, life threatening emergency, suicidal or homicidal thoughts you must seek medical attention immediately by calling 911 or calling your MD immediately  if symptoms less severe.  You Must read complete instructions/literature along with all the possible adverse reactions/side effects for all the Medicines you take and that have been prescribed to you. Take any new Medicines after you have completely understood and accpet all the possible adverse reactions/side effects.   Please note  You were cared for by a hospitalist during your hospital stay. If you have any questions about your discharge medications or the care you received while you were in the hospital after you are discharged, you can call the unit and asked to speak with the hospitalist on call if the hospitalist that took care of you is not available. Once you are discharged, your primary care physician will handle any further medical issues. Please note that NO REFILLS for any discharge medications will be authorized once you are discharged, as it is imperative that you return to your primary care physician (or establish a relationship with a primary care physician if you do not have one) for your aftercare needs so that they can reassess your need for medications and monitor your lab values.     Today   Shortness of breath improved.  Feels better. No acute events overnight.   VITAL SIGNS:  Blood  pressure 118/75, pulse 89, temperature 97.6 F (36.4 C), temperature source Oral, resp. rate 18, height 5\' 6"  (1.676 m), weight 94 kg (207 lb 4.8 oz), SpO2 95 %.  I/O:   Intake/Output Summary (Last 24 hours) at 10/26/15 1700 Last data filed at 10/26/15 1050  Gross per 24 hour  Intake             1140 ml  Output             2200 ml  Net            -1060 ml    PHYSICAL EXAMINATION:  GENERAL:  30 y.o.-year-old patient lying in the bed with no acute distress.  EYES: Pupils equal, round, reactive to light and accommodation. No scleral icterus. Extraocular muscles intact.  HEENT: Head atraumatic, normocephalic. Oropharynx and nasopharynx clear.  NECK:  Supple, no jugular venous distention. No thyroid enlargement, no tenderness.  LUNGS: shallow resp. effort, no wheezing, bibasilar rales,No rhonchi. No use of accessory muscles of respiration.  CARDIOVASCULAR: S1, S2 normal. No murmurs,  rubs, or gallops.  ABDOMEN: Soft, non-tender, non-distended. Bowel sounds present. No organomegaly or mass.  EXTREMITIES: No pedal edema, cyanosis, or clubbing.  NEUROLOGIC: Cranial nerves II through XII are intact. No focal motor or sensory defecits b/l.  PSYCHIATRIC: The patient is alert and oriented x 3. Good affect.  SKIN: No obvious rash, lesion, or ulcer.   DATA REVIEW:   CBC  Recent Labs Lab 10/25/15 0323  WBC 11.1*  HGB 12.0*  HCT 37.2*  PLT 357    Chemistries   Recent Labs Lab 10/22/15 1553  10/24/15 0441 10/25/15 0323  NA 137  < > 137 138  K 4.0  < > 4.4 4.4  CL 109  < > 107 106  CO2 23  < > 23 25  GLUCOSE 120*  < > 131* 115*  BUN 26*  < > 25* 24*  CREATININE 2.36*  < > 2.22* 2.03*  CALCIUM 8.3*  < > 8.9 9.1  MG  --   --  1.9  --   AST 18  --   --   --   ALT 14*  --   --   --   ALKPHOS 67  --   --   --   BILITOT 0.6  --   --   --   < > = values in this interval not displayed.  Cardiac Enzymes  Recent Labs Lab 10/23/15 0659  TROPONINI 0.05*    Microbiology Results   Results for orders placed or performed during the hospital encounter of 10/22/15  Blood Culture (routine x 2)     Status: None (Preliminary result)   Collection Time: 10/22/15  2:43 PM  Result Value Ref Range Status   Specimen Description BLOOD LEFT HAND  Final   Special Requests BOTTLES DRAWN AEROBIC AND ANAEROBIC 1CCAERO,1CCANA  Final   Culture NO GROWTH 4 DAYS  Final   Report Status PENDING  Incomplete  Blood Culture (routine x 2)     Status: None (Preliminary result)   Collection Time: 10/22/15  2:44 PM  Result Value Ref Range Status   Specimen Description BLOOD RIGHT HAND  Final   Special Requests BOTTLES DRAWN AEROBIC AND ANAEROBIC 1CCAERO,1CCANA  Final   Culture NO GROWTH 4 DAYS  Final   Report Status PENDING  Incomplete  Urine culture     Status: Abnormal   Collection Time: 10/22/15  7:52 PM  Result Value Ref Range Status   Specimen Description URINE, RANDOM  Final   Special Requests NONE  Final   Culture (A)  Final    1,000 COLONIES/mL INSIGNIFICANT GROWTH [facility]     Report Status 10/24/2015 FINAL  Final  MRSA PCR Screening     Status: None   Collection Time: 10/22/15  9:15 PM  Result Value Ref Range Status   MRSA by PCR NEGATIVE NEGATIVE Final    Comment:        The GeneXpert MRSA Assay (FDA approved for NASAL specimens only), is one component of a comprehensive MRSA colonization surveillance program. It is not intended to diagnose MRSA infection nor to guide or monitor treatment for MRSA infections.     RADIOLOGY:  No results found.    Management plans discussed with the patient, family and they are in agreement.  CODE STATUS:  Code Status History    Date Active Date Inactive Code Status Order ID Comments User Context   10/22/2015  6:53 PM 10/26/2015  2:52 PM Full Code [id number]  Danella DeisVivek  Jasper Loser, MD Inpatient   10/09/2015 11:28 AM 10/13/2015  3:22 PM Full Code [id number]  Enid Baas, MD Inpatient   08/17/2015  2:35 PM 08/20/2015   5:27 PM Full Code [id number]  Enedina Finner, MD ED   01/22/2015  5:27 PM 01/24/2015  2:25 PM Full Code [id number]  Houston Siren, MD Inpatient      TOTAL TIME TAKING CARE OF THIS PATIENT: 40 minutes.    Houston Siren M.D on 10/26/2015 at 5:00 PM  Between 7am to 6pm - Pager - [phone]  After 6pm go to [email or web address] - password [email or web address] [facility]  [place] [facility]  Office  [phone]  CC: Primary care physician; Mickel Fuchs, MD

## 2015-10-26 NOTE — Telephone Encounter (Signed)
-----   Message from [name], MD sent at 10/25/2015 10:57 AM EDT ----- Regarding: HFU 30 min visit Patient of Dr. Ardyth Manam, please schedule HFU in the next 2-3 weeks with Dr. Ardyth Manam. 30min visit, for HFU due to ILD flare; or 15min if 30min not available.   Thanks VM

## 2015-10-26 NOTE — Progress Notes (Signed)
Alert and oriented. Vital signs stable . No signs of acute distress. Discharge instructions given to patient and family member. Patient and family verbalized understanding. No other issues noted at this time.

## 2015-10-27 LAB — CULTURE, BLOOD (ROUTINE X 2)
CULTURE: NO GROWTH
Culture: NO GROWTH

## 2015-10-27 NOTE — Telephone Encounter (Signed)
Tried to call pt again. No VM available.

## 2015-10-28 ENCOUNTER — Inpatient Hospital Stay: Payer: Medicare Other | Admitting: Internal Medicine

## 2015-10-28 NOTE — Telephone Encounter (Signed)
Tried to call pt no VM available.

## 2015-10-29 ENCOUNTER — Encounter: Payer: Self-pay | Admitting: *Deleted

## 2015-10-29 NOTE — Telephone Encounter (Signed)
Tried to call pt and no VM available. Will send letter due to multiple attempts to reach patient.

## 2015-11-01 ENCOUNTER — Inpatient Hospital Stay: Payer: Medicare Other | Admitting: Internal Medicine

## 2015-11-01 ENCOUNTER — Encounter: Payer: Self-pay | Admitting: *Deleted

## 2015-11-10 ENCOUNTER — Other Ambulatory Visit: Payer: Self-pay | Admitting: Psychiatry

## 2015-11-16 ENCOUNTER — Other Ambulatory Visit: Payer: Self-pay | Admitting: Psychiatry

## 2015-12-17 ENCOUNTER — Emergency Department: Payer: Medicare Other

## 2015-12-17 ENCOUNTER — Encounter: Payer: Self-pay | Admitting: Emergency Medicine

## 2015-12-17 ENCOUNTER — Inpatient Hospital Stay
Admission: EM | Admit: 2015-12-17 | Discharge: 2015-12-20 | DRG: 189 | Disposition: A | Discharge status: Skilled Nursing Facility | Payer: Medicare Other | Attending: Specialist | Admitting: Specialist

## 2015-12-17 DIAGNOSIS — J849 Interstitial pulmonary disease, unspecified: Secondary | ICD-10-CM | POA: Diagnosis present

## 2015-12-17 DIAGNOSIS — R410 Disorientation, unspecified: Secondary | ICD-10-CM | POA: Diagnosis present

## 2015-12-17 DIAGNOSIS — Z8249 Family history of ischemic heart disease and other diseases of the circulatory system: Secondary | ICD-10-CM | POA: Diagnosis not present

## 2015-12-17 DIAGNOSIS — J9600 Acute respiratory failure, unspecified whether with hypoxia or hypercapnia: Secondary | ICD-10-CM

## 2015-12-17 DIAGNOSIS — I130 Hypertensive heart and chronic kidney disease with heart failure and stage 1 through stage 4 chronic kidney disease, or unspecified chronic kidney disease: Secondary | ICD-10-CM | POA: Diagnosis present

## 2015-12-17 DIAGNOSIS — J9601 Acute respiratory failure with hypoxia: Secondary | ICD-10-CM

## 2015-12-17 DIAGNOSIS — J9611 Chronic respiratory failure with hypoxia: Secondary | ICD-10-CM

## 2015-12-17 DIAGNOSIS — J189 Pneumonia, unspecified organism: Secondary | ICD-10-CM | POA: Diagnosis present

## 2015-12-17 DIAGNOSIS — N183 Chronic kidney disease, stage 3 (moderate): Secondary | ICD-10-CM | POA: Diagnosis present

## 2015-12-17 DIAGNOSIS — Z79899 Other long term (current) drug therapy: Secondary | ICD-10-CM

## 2015-12-17 DIAGNOSIS — K219 Gastro-esophageal reflux disease without esophagitis: Secondary | ICD-10-CM | POA: Diagnosis present

## 2015-12-17 DIAGNOSIS — I5032 Chronic diastolic (congestive) heart failure: Secondary | ICD-10-CM | POA: Diagnosis present

## 2015-12-17 DIAGNOSIS — R451 Restlessness and agitation: Secondary | ICD-10-CM | POA: Diagnosis present

## 2015-12-17 DIAGNOSIS — E1122 Type 2 diabetes mellitus with diabetic chronic kidney disease: Secondary | ICD-10-CM | POA: Diagnosis present

## 2015-12-17 DIAGNOSIS — Z9981 Dependence on supplemental oxygen: Secondary | ICD-10-CM

## 2015-12-17 DIAGNOSIS — F909 Attention-deficit hyperactivity disorder, unspecified type: Secondary | ICD-10-CM | POA: Diagnosis present

## 2015-12-17 DIAGNOSIS — F840 Autistic disorder: Secondary | ICD-10-CM | POA: Diagnosis present

## 2015-12-17 DIAGNOSIS — F259 Schizoaffective disorder, unspecified: Secondary | ICD-10-CM | POA: Diagnosis present

## 2015-12-17 DIAGNOSIS — J9621 Acute and chronic respiratory failure with hypoxia: Principal | ICD-10-CM | POA: Diagnosis not present

## 2015-12-17 DIAGNOSIS — F319 Bipolar disorder, unspecified: Secondary | ICD-10-CM | POA: Diagnosis present

## 2015-12-17 DIAGNOSIS — J479 Bronchiectasis, uncomplicated: Secondary | ICD-10-CM | POA: Diagnosis present

## 2015-12-17 DIAGNOSIS — R0602 Shortness of breath: Secondary | ICD-10-CM

## 2015-12-17 LAB — URINALYSIS COMPLETE WITH MICROSCOPIC (ARMC ONLY)
BILIRUBIN URINE: NEGATIVE
GLUCOSE, UA: NEGATIVE mg/dL
Hgb urine dipstick: NEGATIVE
KETONES UR: NEGATIVE mg/dL
LEUKOCYTES UA: NEGATIVE
NITRITE: NEGATIVE
PH: 5 (ref 5.0–8.0)
Protein, ur: 100 mg/dL — AB
RBC / HPF: NONE SEEN RBC/hpf (ref 0–5)
SQUAMOUS EPITHELIAL / LPF: NONE SEEN
Specific Gravity, Urine: 1.008 (ref 1.005–1.030)

## 2015-12-17 LAB — COMPREHENSIVE METABOLIC PANEL
ALT: 11 U/L — ABNORMAL LOW (ref 17–63)
ANION GAP: 9 (ref 5–15)
AST: 15 U/L (ref 15–41)
Albumin: 3.2 g/dL — ABNORMAL LOW (ref 3.5–5.0)
Alkaline Phosphatase: 54 U/L (ref 38–126)
BUN: 40 mg/dL — ABNORMAL HIGH (ref 6–20)
CALCIUM: 9 mg/dL (ref 8.9–10.3)
CHLORIDE: 96 mmol/L — AB (ref 101–111)
CO2: 31 mmol/L (ref 22–32)
Creatinine, Ser: 2.11 mg/dL — ABNORMAL HIGH (ref 0.61–1.24)
GFR, EST AFRICAN AMERICAN: 47 mL/min — AB (ref >60)
GFR, EST NON AFRICAN AMERICAN: 41 mL/min — AB (ref >60)
Glucose, Bld: 106 mg/dL — ABNORMAL HIGH (ref 65–99)
Potassium: 3.9 mmol/L (ref 3.5–5.1)
SODIUM: 136 mmol/L (ref 135–145)
Total Bilirubin: 0.3 mg/dL (ref 0.3–1.2)
Total Protein: 7.5 g/dL (ref 6.5–8.1)

## 2015-12-17 LAB — CBC WITH DIFFERENTIAL/PLATELET
BASOS PCT: 1 %
Basophils Absolute: 0.1 10*3/uL (ref 0–0.1)
Eosinophils Absolute: 0.4 10*3/uL (ref 0–0.7)
Eosinophils Relative: 3 %
HEMATOCRIT: 34.3 % — AB (ref 40.0–52.0)
HEMOGLOBIN: 10.6 g/dL — AB (ref 13.0–18.0)
LYMPHS ABS: 2.4 10*3/uL (ref 1.0–3.6)
Lymphocytes Relative: 18 %
MCH: 22 pg — AB (ref 26.0–34.0)
MCHC: 30.8 g/dL — AB (ref 32.0–36.0)
MCV: 71.3 fL — AB (ref 80.0–100.0)
MONO ABS: 0.9 10*3/uL (ref 0.2–1.0)
MONOS PCT: 7 %
NEUTROS ABS: 9.8 10*3/uL — AB (ref 1.4–6.5)
NEUTROS PCT: 71 %
Platelets: 369 10*3/uL (ref 150–440)
RBC: 4.81 MIL/uL (ref 4.40–5.90)
RDW: 14.3 % (ref 11.5–14.5)
WBC: 13.6 10*3/uL — ABNORMAL HIGH (ref 3.8–10.6)

## 2015-12-17 LAB — LACTIC ACID, PLASMA
LACTIC ACID, VENOUS: 1 mmol/L (ref 0.5–1.9)
LACTIC ACID, VENOUS: 3 mmol/L — AB (ref 0.5–1.9)

## 2015-12-17 LAB — GLUCOSE, CAPILLARY
Glucose-Capillary: 141 mg/dL — ABNORMAL HIGH (ref 65–99)
Glucose-Capillary: 116 mg/dL — ABNORMAL HIGH (ref 65–99)

## 2015-12-17 LAB — BRAIN NATRIURETIC PEPTIDE: B Natriuretic Peptide: 30 pg/mL (ref 0.0–100.0)

## 2015-12-17 LAB — TROPONIN I: TROPONIN I: 0.05 ng/mL — AB (ref <0.03)

## 2015-12-17 MED ORDER — THIOTHIXENE 10 MG PO CAPS
10.0000 mg | ORAL_CAPSULE | Freq: Two times a day (BID) | ORAL | Status: DC
  Administered 2015-12-17 – 2015-12-20 (×6): 10 mg via ORAL
  Filled 2015-12-17 (×6): qty 1

## 2015-12-17 MED ORDER — INSULIN ASPART 100 UNIT/ML ~~LOC~~ SOLN
0.0000 [IU] | Freq: Three times a day (TID) | SUBCUTANEOUS | Status: DC
  Administered 2015-12-17 – 2015-12-19 (×2): 2 [IU] via SUBCUTANEOUS
  Filled 2015-12-17 (×2): qty 2

## 2015-12-17 MED ORDER — SODIUM CHLORIDE 0.9% FLUSH
3.0000 mL | Freq: Two times a day (BID) | INTRAVENOUS | Status: DC
  Administered 2015-12-17 – 2015-12-20 (×7): 3 mL via INTRAVENOUS

## 2015-12-17 MED ORDER — ONDANSETRON HCL 4 MG PO TABS: 4.0000 mg | ORAL_TABLET | Freq: Four times a day (QID) | ORAL | Status: DC | PRN

## 2015-12-17 MED ORDER — OXYCODONE HCL 5 MG PO TABS: 5.0000 mg | ORAL_TABLET | ORAL | Status: DC | PRN

## 2015-12-17 MED ORDER — ONDANSETRON HCL 4 MG/2ML IJ SOLN: 4.0000 mg | Freq: Four times a day (QID) | INTRAMUSCULAR | Status: DC | PRN

## 2015-12-17 MED ORDER — POLYETHYLENE GLYCOL 3350 17 G PO PACK
17.0000 g | PACK | Freq: Every day | ORAL | Status: DC
  Administered 2015-12-18 – 2015-12-19 (×2): 17 g via ORAL
  Filled 2015-12-17 (×3): qty 1

## 2015-12-17 MED ORDER — LORAZEPAM 2 MG/ML IJ SOLN
0.5000 mg | INTRAMUSCULAR | Status: AC
  Administered 2015-12-17: 0.5 mg via INTRAVENOUS
  Filled 2015-12-17: qty 1

## 2015-12-17 MED ORDER — METHYLPREDNISOLONE SODIUM SUCC 125 MG IJ SOLR
125.0000 mg | Freq: Once | INTRAMUSCULAR | Status: AC
  Administered 2015-12-17: 125 mg via INTRAVENOUS
  Filled 2015-12-17: qty 2

## 2015-12-17 MED ORDER — LEVOFLOXACIN IN D5W 750 MG/150ML IV SOLN
750.0000 mg | Freq: Once | INTRAVENOUS | Status: AC
  Administered 2015-12-17: 750 mg via INTRAVENOUS
  Filled 2015-12-17: qty 150

## 2015-12-17 MED ORDER — INSULIN ASPART 100 UNIT/ML ~~LOC~~ SOLN: 0.0000 [IU] | Freq: Every day | SUBCUTANEOUS | Status: DC

## 2015-12-17 MED ORDER — METHYLPREDNISOLONE SODIUM SUCC 125 MG IJ SOLR
60.0000 mg | Freq: Two times a day (BID) | INTRAMUSCULAR | Status: DC
  Administered 2015-12-17 – 2015-12-19 (×4): 60 mg via INTRAVENOUS
  Filled 2015-12-17 (×4): qty 2

## 2015-12-17 MED ORDER — LEVALBUTEROL HCL 0.63 MG/3ML IN NEBU
0.6300 mg | INHALATION_SOLUTION | Freq: Once | RESPIRATORY_TRACT | Status: AC
  Administered 2015-12-17: 0.63 mg via RESPIRATORY_TRACT
  Filled 2015-12-17: qty 3

## 2015-12-17 MED ORDER — ACETAMINOPHEN 650 MG RE SUPP: 650.0000 mg | Freq: Four times a day (QID) | RECTAL | Status: DC | PRN

## 2015-12-17 MED ORDER — SODIUM CHLORIDE 0.9 % IV BOLUS (SEPSIS)
1000.0000 mL | Freq: Once | INTRAVENOUS | Status: AC
  Administered 2015-12-17: 1000 mL via INTRAVENOUS

## 2015-12-17 MED ORDER — PANTOPRAZOLE SODIUM 40 MG PO TBEC
40.0000 mg | DELAYED_RELEASE_TABLET | Freq: Two times a day (BID) | ORAL | Status: DC
  Administered 2015-12-18 – 2015-12-20 (×5): 40 mg via ORAL
  Filled 2015-12-17 (×5): qty 1

## 2015-12-17 MED ORDER — IPRATROPIUM-ALBUTEROL 0.5-2.5 (3) MG/3ML IN SOLN: 3.0000 mL | Freq: Four times a day (QID) | RESPIRATORY_TRACT | Status: DC | PRN

## 2015-12-17 MED ORDER — SODIUM CHLORIDE 0.9% FLUSH
3.0000 mL | Freq: Two times a day (BID) | INTRAVENOUS | Status: DC
  Administered 2015-12-17 – 2015-12-20 (×6): 3 mL via INTRAVENOUS

## 2015-12-17 MED ORDER — CARBAMAZEPINE 200 MG PO TABS
200.0000 mg | ORAL_TABLET | Freq: Two times a day (BID) | ORAL | Status: DC
  Administered 2015-12-17 – 2015-12-20 (×6): 200 mg via ORAL
  Filled 2015-12-17 (×6): qty 1

## 2015-12-17 MED ORDER — SODIUM CHLORIDE 0.9 % IV SOLN: 250.0000 mL | INTRAVENOUS | Status: DC | PRN

## 2015-12-17 MED ORDER — ACETAMINOPHEN 325 MG PO TABS: 650.0000 mg | ORAL_TABLET | Freq: Four times a day (QID) | ORAL | Status: DC | PRN

## 2015-12-17 MED ORDER — TORSEMIDE 5 MG PO TABS
10.0000 mg | ORAL_TABLET | Freq: Every day | ORAL | Status: DC
  Administered 2015-12-18 – 2015-12-20 (×3): 10 mg via ORAL
  Filled 2015-12-17 (×3): qty 2
  Filled 2015-12-17: qty 1
  Filled 2015-12-17: qty 2

## 2015-12-17 MED ORDER — PIPERACILLIN-TAZOBACTAM 3.375 G IVPB 30 MIN
3.3750 g | Freq: Once | INTRAVENOUS | Status: AC
  Administered 2015-12-17: 3.375 g via INTRAVENOUS
  Filled 2015-12-17: qty 50

## 2015-12-17 MED ORDER — ENOXAPARIN SODIUM 40 MG/0.4ML ~~LOC~~ SOLN
40.0000 mg | SUBCUTANEOUS | Status: DC
  Filled 2015-12-17 (×3): qty 0.4

## 2015-12-17 MED ORDER — IPRATROPIUM-ALBUTEROL 0.5-2.5 (3) MG/3ML IN SOLN
3.0000 mL | RESPIRATORY_TRACT | Status: DC
  Administered 2015-12-17 – 2015-12-18 (×4): 3 mL via RESPIRATORY_TRACT
  Filled 2015-12-17 (×4): qty 3

## 2015-12-17 MED ORDER — LEVOFLOXACIN IN D5W 500 MG/100ML IV SOLN
500.0000 mg | INTRAVENOUS | Status: DC
  Administered 2015-12-18 – 2015-12-19 (×2): 500 mg via INTRAVENOUS
  Filled 2015-12-17 (×3): qty 100

## 2015-12-17 MED ORDER — ALBUTEROL SULFATE (2.5 MG/3ML) 0.083% IN NEBU
2.5000 mg | INHALATION_SOLUTION | RESPIRATORY_TRACT | Status: DC | PRN
  Administered 2015-12-17: 2.5 mg via RESPIRATORY_TRACT
  Filled 2015-12-17: qty 3

## 2015-12-17 MED ORDER — VANCOMYCIN HCL IN DEXTROSE 1-5 GM/200ML-% IV SOLN
1000.0000 mg | Freq: Once | INTRAVENOUS | Status: AC
  Administered 2015-12-17: 1000 mg via INTRAVENOUS
  Filled 2015-12-17: qty 200

## 2015-12-17 MED ORDER — MORPHINE SULFATE (PF) 2 MG/ML IV SOLN
1.0000 mg | INTRAVENOUS | Status: DC | PRN
  Administered 2015-12-17 – 2015-12-19 (×3): 2 mg via INTRAVENOUS
  Administered 2015-12-19: 1 mg via INTRAVENOUS
  Filled 2015-12-17 (×4): qty 1

## 2015-12-17 MED ORDER — LORAZEPAM 2 MG/ML IJ SOLN
INTRAMUSCULAR | Status: AC
  Administered 2015-12-17: 0.5 mg via INTRAVENOUS
  Filled 2015-12-17: qty 1

## 2015-12-17 MED ORDER — DIPHENHYDRAMINE HCL 25 MG PO TABS
25.0000 mg | ORAL_TABLET | Freq: Every evening | ORAL | Status: DC | PRN
  Filled 2015-12-17: qty 1

## 2015-12-17 MED ORDER — LORAZEPAM 2 MG/ML IJ SOLN
0.5000 mg | Freq: Once | INTRAMUSCULAR | Status: AC
  Administered 2015-12-17: 0.5 mg via INTRAVENOUS

## 2015-12-17 MED ORDER — BISACODYL 10 MG RE SUPP: 10.0000 mg | Freq: Every day | RECTAL | Status: DC | PRN

## 2015-12-17 MED ORDER — QUETIAPINE FUMARATE 25 MG PO TABS
100.0000 mg | ORAL_TABLET | Freq: Every day | ORAL | Status: DC
  Administered 2015-12-17 – 2015-12-20 (×4): 100 mg via ORAL
  Filled 2015-12-17 (×5): qty 4

## 2015-12-17 MED ORDER — SODIUM CHLORIDE 0.9% FLUSH: 3.0000 mL | INTRAVENOUS | Status: DC | PRN

## 2015-12-17 MED ORDER — QUETIAPINE FUMARATE 200 MG PO TABS
600.0000 mg | ORAL_TABLET | Freq: Every day | ORAL | Status: DC
  Administered 2015-12-17 – 2015-12-19 (×3): 600 mg via ORAL
  Filled 2015-12-17 (×5): qty 3

## 2015-12-17 MED ORDER — POLYETHYLENE GLYCOL 3350 17 G PO PACK: 17.0000 g | PACK | Freq: Every day | ORAL | Status: DC | PRN

## 2015-12-17 NOTE — Progress Notes (Signed)
Pharmacy Antibiotic Note  Walter Wong is a 30 y.o. male admitted on 12/17/2015 with COPD/Acute on chronic respiratory failure.  Pharmacy has been consulted for levofloxacin dosing. Patient received levofloxacin 750mg  IV x 1 in ED. Patient also received 1 dose of vancomycin and Zosyn.   Plan: Will start patient on Levofloxacin 500mg  Daily.   Height: 5\' 6"  (167.6 cm) Weight: 195 lb 6 oz (88.6 kg) IBW/kg (Calculated) : 63.8  Temp (24hrs), Avg:99.5 F (37.5 C), Min:99.5 F (37.5 C), Max:99.5 F (37.5 C)   Recent Labs Lab 12/17/15 1134 12/17/15 1207  WBC  --  13.6*  CREATININE 2.11*  --   LATICACIDVEN 1.0  --     Estimated Creatinine Clearance: 53.8 mL/min (by C-G formula based on SCr of 2.11 mg/dL (H)).    No Known Allergies  Antimicrobials this admission: 10/6 Vanco/Zosyn >> 10/6 10/6 Levofloxacin >>   Dose adjustments this admission:   Microbiology results: BCx:  UCx:   Sputum:  MRSA PCR:  Thank you for allowing pharmacy to be a part of this patient's care.  Cher NakaiSheema Tytus Strahle, PharmD Clinical Pharmacist 12/17/2015 2:43 PM

## 2015-12-17 NOTE — ED Notes (Signed)
Phlebotomy at bedside for cultures 

## 2015-12-17 NOTE — Progress Notes (Signed)
The chaplain visited the patient when he received a rapid response page. When the chaplain arrived a nurse told the chaplain that the patient had some problems with breathing, but everything was normal. Since there were care givers in the room the nurse advised the chaplain to come back later. When the chaplain returned the patient was alone and the chaplain and patient prayed together. The chaplain made a mistake with two previous notes because they supposed to be for the patient with MRN [id number]. Please, accept my apology.

## 2015-12-17 NOTE — Progress Notes (Signed)
Chaplain on call received a referral from a nurse. The chaplain on call with his mentor went to see the patient, but the patient was in a deep sleep and we decided not to wake him up, but planned doing a fellow-up.

## 2015-12-17 NOTE — Progress Notes (Signed)
Anticoagulation monitoring(Lovenox):  30 yo  Male  ordered Lovenox 30 mg Q24h  Filed Weights   12/17/15 1035  Weight: 195 lb 6 oz (88.6 kg)   BMI    Lab Results  Component Value Date   CREATININE 2.11 (H) 12/17/2015   CREATININE 2.03 (H) 10/25/2015   CREATININE 2.22 (H) 10/24/2015   Estimated Creatinine Clearance: 53.8 mL/min (by C-G formula based on SCr of 2.11 mg/dL (H)). Hemoglobin & Hematocrit     Component Value Date/Time   HGB 10.6 (L) 12/17/2015 1207   HGB 12.5 (L) 08/21/2011 1558   HCT 34.3 (L) 12/17/2015 1207   HCT 40.4 08/21/2011 1558     Per Protocol for Patient with estCrcl > 30 ml/min and BMI < 40, will transition to Lovenox 40 mg Q24h.

## 2015-12-17 NOTE — H&P (Signed)
[facility] - [place] at [facility]   PATIENT NAME: Walter Wong    MR#:  [id number]  DATE OF BIRTH:  [birth year]  DATE OF ADMISSION:  12/17/2015  PRIMARY CARE PHYSICIAN: Mickel Fuchs, MD   REQUESTING/REFERRING PHYSICIAN: Dr. Shaune Pollack  CHIEF COMPLAINT:   Chief Complaint  Patient presents with  . Shortness of Breath    HISTORY OF PRESENT ILLNESS:  Walter Wong  is a 30 y.o. male with a known history of Hypertension, schizophrenia, diabetes on 4 L oxygen at baseline presents to the emergency room from local nursing home due to worsening shortness of breath. Patient was here at the hospital in August 2017 and later was at [facility]. Transition to [facility] nursing home. Has been on 4 L oxygen with today got acutely short of breath and coughing and was sent to emergency room. Here he has been noticed to have tachypnea close to 30 with tachycardia. Chest x-ray shows no infiltrates. With his history of interstitial lung disease and worsening shortness of breath he is being admitted to the hospitalist service.  PAST MEDICAL HISTORY:   Past Medical History:  Diagnosis Date  . ADHD (attention deficit hyperactivity disorder)   . Bipolar disorder (HCC)   . CHF (congestive heart failure) (HCC)    Diastolic CHF  . Chronic kidney disease    Stage 3  . Diabetes mellitus, type II (HCC)   . Interstitial lung disease (HCC)    on nocturnal home o2  . Schizophrenia (HCC)     PAST SURGICAL HISTORY:   Past Surgical History:  Procedure Laterality Date  . DENTAL SURGERY    . ESOPHAGOGASTRODUODENOSCOPY (EGD) WITH PROPOFOL N/A 10/12/2015   Procedure: ESOPHAGOGASTRODUODENOSCOPY (EGD) WITH PROPOFOL;  Surgeon: Midge Minium, MD;  Location: [facility] ENDOSCOPY;  Service: Endoscopy;  Laterality: N/A;   SOCIAL HISTORY:   Social History  Substance Use Topics  . Smoking status: Never Smoker  . Smokeless tobacco: Never Used  . Alcohol use No    FAMILY HISTORY:   Family History   Problem Relation Age of Onset  . Kidney failure Mother   . Congestive Heart Failure Mother    DRUG ALLERGIES:  No Known Allergies  REVIEW OF SYSTEMS:   Review of Systems  Constitutional: Positive for malaise/fatigue. Negative for chills, fever and weight loss.  HENT: Negative for hearing loss and nosebleeds.   Eyes: Negative for blurred vision, double vision and pain.  Respiratory: Positive for cough and shortness of breath. Negative for hemoptysis, sputum production and wheezing.   Cardiovascular: Negative for chest pain, palpitations, orthopnea and leg swelling.  Gastrointestinal: Negative for abdominal pain, constipation, diarrhea, nausea and vomiting.  Genitourinary: Negative for dysuria and hematuria.  Musculoskeletal: Negative for back pain, falls and myalgias.  Skin: Negative for rash.  Neurological: Negative for dizziness, tremors, sensory change, speech change, focal weakness, seizures and headaches.  Endo/Heme/Allergies: Does not bruise/bleed easily.  Psychiatric/Behavioral: Negative for depression and memory loss. The patient is not nervous/anxious.    MEDICATIONS AT HOME:   Prior to Admission medications   Medication Sig Start Date End Date Taking? Authorizing Provider  acetaminophen (TYLENOL) 325 MG tablet Take 650 mg by mouth every 6 (six) hours as needed.   Yes Historical Provider, MD  carbamazepine (TEGRETOL) 200 MG tablet Take 1 tablet (200 mg total) by mouth 2 (two) times daily. 05/13/15  Yes Audery Amel, MD  diphenhydrAMINE (BENADRYL) 25 MG tablet Take 25 mg by mouth at bedtime as needed for sleep.  Yes Historical Provider, MD  pantoprazole (PROTONIX) 40 MG tablet Take 1 tablet (40 mg total) by mouth 2 (two) times daily. 10/13/15  Yes Enid Baas, MD  polyethylene glycol (MIRALAX / GLYCOLAX) packet Take 17 g by mouth daily.   Yes Historical Provider, MD  predniSONE (DELTASONE) 20 MG tablet Take 20 mg by mouth. Give 0.5 to 1 tablet daily related to acute and  chronic respiratory failure.   Yes Historical Provider, MD  QUEtiapine (SEROQUEL) 100 MG tablet Take 1 tablet (100 mg total) by mouth daily. 05/13/15  Yes Audery Amel, MD  QUEtiapine (SEROQUEL) 300 MG tablet Take 2 tablets (600 mg total) by mouth at bedtime. 05/13/15  Yes Audery Amel, MD  thiothixene (NAVANE) 10 MG capsule Take 1 capsule (10 mg total) by mouth 2 (two) times daily. 05/13/15  Yes Audery Amel, MD  tiotropium (SPIRIVA HANDIHALER) 18 MCG inhalation capsule Place 1 capsule (18 mcg total) into inhaler and inhale daily. 08/30/15 08/29/16 Yes Shane Crutch, MD  torsemide (DEMADEX) 10 MG tablet Take 10 mg by mouth daily.   Yes Historical Provider, MD  traMADol (ULTRAM) 50 MG tablet Take by mouth every 6 (six) hours as needed.   Yes Historical Provider, MD  amoxicillin-clavulanate (AUGMENTIN) 875-125 MG tablet Take 1 tablet by mouth 2 (two) times daily. Patient not taking: Reported on 12/17/2015 10/26/15   Houston Siren, MD   VITAL SIGNS:  Blood pressure 112/72, pulse (!) 114, temperature 99.5 F (37.5 C), temperature source Axillary, resp. rate (!) 30, height 5\' 6"  (1.676 m), weight 88.6 kg (195 lb 6 oz), SpO2 98 %.  PHYSICAL EXAMINATION:  Physical Exam  GENERAL:  30 y.o.-year-old patient lying in the bed with  One or 2 with conversational dyspnea EYES: Pupils equal, round, reactive to light and accommodation. No scleral icterus. Extraocular muscles intact.  HEENT: Head atraumatic, normocephalic. Oropharynx and nasopharynx clear. No oropharyngeal erythema, moist oral mucosa  NECK:  Supple, no jugular venous distention. No thyroid enlargement, no tenderness.  LUNGS: Increased work of breathing. Decreased air entry. CARDIOVASCULAR: S1, S2 normal. No murmurs, rubs, or gallops.  ABDOMEN: Soft, nontender, nondistended. Bowel sounds present. No organomegaly or mass.  EXTREMITIES: No pedal edema, cyanosis, or clubbing. + 2 pedal & radial pulses b/l.   NEUROLOGIC: Cranial nerves II  through XII are intact. No focal Motor or sensory deficits appreciated b/l PSYCHIATRIC: The patient is alert and awake. Pleasant. Anxious SKIN: No obvious rash, lesion, or ulcer.  Right upper extremity PICC line LABORATORY PANEL:   CBC  Recent Labs Lab 12/17/15 1207  WBC 13.6*  HGB 10.6*  HCT 34.3*  PLT 369   ------------------------------------------------------------------------------------------------------------------  Chemistries   Recent Labs Lab 12/17/15 1134  NA 136  K 3.9  CL 96*  CO2 31  GLUCOSE 106*  BUN 40*  CREATININE 2.11*  CALCIUM 9.0  AST 15  ALT 11*  ALKPHOS 54  BILITOT 0.3   ------------------------------------------------------------------------------------------------------------------ Cardiac Enzymes  Recent Labs Lab 12/17/15 1134  TROPONINI 0.05*   ------------------------------------------------------------------------------------------------------------------ RADIOLOGY:  Dg Chest Portable 1 View  Result Date: 12/17/2015 CLINICAL DATA:  Shortness of breath and difficulty breathing beginning this morning. History of interstitial lung disease. EXAM: PORTABLE CHEST 1 VIEW COMPARISON:  CT chest 08/18/2015. Single-view of the chest 08/17/2015 and 10/22/2015. FINDINGS: Right PICC is in place with the tip near the superior cavoatrial junction. Lung volumes are low. Changes of chronic interstitial lung disease are stable in appearance. The lungs are otherwise unremarkable. No pneumothorax or  pleural effusion. IMPRESSION: No acute disease. Right PICC projects in good position. Chronic interstitial lung disease. Electronically Signed   By: Drusilla Kannerhomas  Dalessio M.D.   On: 12/17/2015 11:19   IMPRESSION AND PLAN:   * Acute on chronic respiratory failure due to worsening interstitial lung disease and exacerbation Admit as inpatient. Start IV steroids and scheduled nebulizers. Levaquin. Consult pulmonary. BiPAP if any worsening.  * Schizophrenia Continue  home medications.  * CKD stage III is stable. Monitor  * Diabetes mellitus Sliding scale insulin and diabetic diet.  * DVT prophylaxis with renally dosed Lovenox.  All the records are reviewed and case discussed with ED provider. Management plans discussed with the patient, family and they are in agreement.  CODE STATUS: FULL CODE  TOTAL TIME TAKING CARE OF THIS PATIENT: 40 minutes.   Milagros LollSudini, Janyia Guion R M.D on 12/17/2015 at 2:22 PM  Between 7am to 6pm - Pager - [phone]  After 6pm go to [email or web address] - password [email or web address] [facility]  [facility]  Office  [phone]  CC: Primary care physician; Mickel FuchsWROTH, THOMAS H, MD  Note: This dictation was prepared with Dragon dictation along with smaller phrase technology. Any transcriptional errors that result from this process are unintentional.

## 2015-12-17 NOTE — Progress Notes (Signed)
Chaplain on call went to visit the patient as a follow up because the patient was asleep during the initial visit. 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 The Chaplain comforted the spouse in every ways possible. The spouse is a strong believer who sees her faith as the foundation of her life. Chaplain had a spiritual conversation with the wife of the patient and the chaplain concluded the visit with a prayer.  

## 2015-12-17 NOTE — ED Provider Notes (Addendum)
[facility] Emergency Department Provider Note   ____________________________________________  Approximate time seen 11:06 AM  I have reviewed the triage vital signs and the nursing notes.   HISTORY  Chief Complaint Shortness of Breath  Caveat-history of present illness and review of systems is limited due to the patient's poor cooperation and refusal to cooperate with my history taking.  HPI Walter Wong is a 30 y.o. male with  medical history of schizoaffective disorder, developmental delay,bipolar disorder, chronic kidney disease stage III, diabetes, severe chronic interstitial lung disease not a candidate for transplant secondary to his psychiatric illness, PICC line in place in the right arm who presents from his SNF for shortness of breath noted today, he denies cough. He denies chest pain vomiting, diarrhea fevers or chills. He will not give much additional history just stating "I don't want to be here".   Past Medical History:  Diagnosis Date  . ADHD (attention deficit hyperactivity disorder)   . Bipolar disorder (HCC)   . CHF (congestive heart failure) (HCC)    Diastolic CHF  . Chronic kidney disease    Stage 3  . Diabetes mellitus, type II (HCC)   . Interstitial lung disease (HCC)    on nocturnal home o2  . Schizophrenia [facility])     Patient Active Problem List   Diagnosis Date Noted  . Acute on chronic respiratory failure with hypoxia (HCC) 10/22/2015  . Stricture and stenosis of esophagus   . Abnormal findings-gastrointestinal tract   . CHF exacerbation (HCC) 10/09/2015  . Schizoaffective disorder (HCC) 08/19/2015  . Sepsis (HCC) 08/17/2015  . Acute on chronic diastolic heart failure (HCC) 02/19/2015  . CHF (congestive heart failure) (HCC) 01/22/2015  . Heart failure (HCC) 01/22/2015  . Bipolar 1 disorder, mixed, moderate (HCC) 11/12/2014  . Bipolar disorder, current episode mixed, moderate (HCC) 11/12/2014    Past Surgical  History:  Procedure Laterality Date  . DENTAL SURGERY    . ESOPHAGOGASTRODUODENOSCOPY (EGD) WITH PROPOFOL N/A 10/12/2015   Procedure: ESOPHAGOGASTRODUODENOSCOPY (EGD) WITH PROPOFOL;  Surgeon: Midge Minium, MD;  Location: [facility] ENDOSCOPY;  Service: Endoscopy;  Laterality: N/A;    Prior to Admission medications   Medication Sig Start Date End Date Taking? Authorizing Provider  acetaminophen (TYLENOL) 325 MG tablet Take 650 mg by mouth every 6 (six) hours as needed.   Yes Historical Provider, MD  carbamazepine (TEGRETOL) 200 MG tablet Take 1 tablet (200 mg total) by mouth 2 (two) times daily. 05/13/15  Yes Audery Amel, MD  diphenhydrAMINE (BENADRYL) 25 MG tablet Take 25 mg by mouth at bedtime as needed for sleep.   Yes Historical Provider, MD  pantoprazole (PROTONIX) 40 MG tablet Take 1 tablet (40 mg total) by mouth 2 (two) times daily. 10/13/15  Yes Enid Baas, MD  polyethylene glycol (MIRALAX / GLYCOLAX) packet Take 17 g by mouth daily.   Yes Historical Provider, MD  predniSONE (DELTASONE) 20 MG tablet Take 20 mg by mouth. Give 0.5 to 1 tablet daily related to acute and chronic respiratory failure.   Yes Historical Provider, MD  QUEtiapine (SEROQUEL) 100 MG tablet Take 1 tablet (100 mg total) by mouth daily. 05/13/15  Yes Audery Amel, MD  QUEtiapine (SEROQUEL) 300 MG tablet Take 2 tablets (600 mg total) by mouth at bedtime. 05/13/15  Yes Audery Amel, MD  thiothixene (NAVANE) 10 MG capsule Take 1 capsule (10 mg total) by mouth 2 (two) times daily. 05/13/15  Yes Audery Amel, MD  tiotropium [facility]  HANDIHALER) 18 MCG inhalation capsule Place 1 capsule (18 mcg total) into inhaler and inhale daily. 08/30/15 08/29/16 Yes Shane Crutch, MD  torsemide (DEMADEX) 10 MG tablet Take 10 mg by mouth daily.   Yes Historical Provider, MD  traMADol (ULTRAM) 50 MG tablet Take by mouth every 6 (six) hours as needed.   Yes Historical Provider, MD  amoxicillin-clavulanate (AUGMENTIN) 875-125 MG tablet Take  1 tablet by mouth 2 (two) times daily. Patient not taking: Reported on 12/17/2015 10/26/15   Houston Siren, MD    Allergies Review of patient's allergies indicates no known allergies.  Family History  Problem Relation Age of Onset  . Kidney failure Mother   . Congestive Heart Failure Mother     Social History Social History  Substance Use Topics  . Smoking status: Never Smoker  . Smokeless tobacco: Never Used  . Alcohol use No    Review of Systems  Respiratory: + shortness of breath.  Caveat-history of present illness and review of systems is limited due to the patient's poor cooperation and refusal to cooperate with my history taking. ____________________________________________   PHYSICAL EXAM:  Vitals:   12/17/15 1330 12/17/15 1332 12/17/15 1345 12/17/15 1349  BP:  112/72    Pulse: (!) 125  (!) 114   Resp:   (!) 52 (!) 30  Temp:      TempSrc:      SpO2: 92%  98%   Weight:      Height:        VITAL SIGNS: ED Triage Vitals  Enc Vitals Group     BP 12/17/15 1029 (!) 138/115     Pulse Rate 12/17/15 1029 (!) 126     Resp 12/17/15 1029 (!) 22     Temp 12/17/15 1029 99.5 F (37.5 C)     Temp Source 12/17/15 1029 Axillary     SpO2 12/17/15 1029 95 %     Weight 12/17/15 1035 195 lb 6 oz (88.6 kg)     Height 12/17/15 1035 5\' 6"  (1.676 m)     Head Circumference --      Peak Flow --      Pain Score --      Pain Loc --      Pain Edu? --      Excl. in GC? --     Constitutional: Alert and oriented. Anxious-appearing and mild to moderate respiratory distress but able to speak in sentences. Developmentally delayed. Eyes: Conjunctivae are normal. PERRL. EOMI. Head: Atraumatic. Nose: No congestion/rhinnorhea. Mouth/Throat: Mucous membranes are moist.  Oropharynx non-erythematous. Neck: No stridor. Supple without meningismus. Cardiovascular: tachycardic rate, regular rhythm. Grossly normal heart sounds.  Good peripheral circulation. Respiratory: Tachypneic with  increased work of breathing, poor air movement bilaterally, no wheeze, no stridor. Gastrointestinal: Soft and nontender. No distention. No CVA tenderness. Genitourinary: deferred Musculoskeletal: No lower extremity tenderness nor edema.  No joint effusions. Neurologic:  Normal speech and language. No gross focal neurologic deficits are appreciated. Skin:  Skin is warm, dry and intact. No rash noted. PICC line in the right arm without surrounding cellulitis, no drainage. Psychiatric: Mood is agitated, affect is blunted, he is at times emotionally labile and screaming loudly that he wants to go home but he is redirectable.  ____________________________________________   LABS (all labs ordered are listed, but only abnormal results are displayed)  Labs Reviewed  COMPREHENSIVE METABOLIC PANEL - Abnormal; Notable for the following:       Result Value   Chloride 96 (*)  Glucose, Bld 106 (*)    BUN 40 (*)    Creatinine, Ser 2.11 (*)    Albumin 3.2 (*)    ALT 11 (*)    GFR calc non Af Amer 41 (*)    GFR calc Af Amer 47 (*)    All other components within normal limits  TROPONIN I - Abnormal; Notable for the following:    Troponin I 0.05 (*)    All other components within normal limits  URINALYSIS COMPLETEWITH MICROSCOPIC ([facility] ONLY) - Abnormal; Notable for the following:    Color, Urine YELLOW (*)    APPearance CLEAR (*)    Protein, ur 100 (*)    Bacteria, UA RARE (*)    All other components within normal limits  CULTURE, BLOOD (ROUTINE X 2)  CULTURE, BLOOD (ROUTINE X 2)  LACTIC ACID, PLASMA  CBC WITH DIFFERENTIAL/PLATELET  LACTIC ACID, PLASMA  CBC WITH DIFFERENTIAL/PLATELET  BRAIN NATRIURETIC PEPTIDE   ____________________________________________  EKG  ED ECG REPORT I, Gayla DossGayle, Detria Cummings A, the attending physician, personally viewed and interpreted this ECG.   Date: 12/17/2015  EKG Time: 11:41  Rate: 136  Rhythm: sinus tachycardia  Axis: normal  Intervals:none  ST&T Change:  No acute ST elevation or acute ST depression. Q waves in lead 1, 2, lead 3.  ____________________________________________  RADIOLOGY  CXR FINDINGS:  Right PICC is in place with the tip near the superior cavoatrial  junction. Lung volumes are low. Changes of chronic interstitial lung  disease are stable in appearance. The lungs are otherwise  unremarkable. No pneumothorax or pleural effusion.    IMPRESSION:  No acute disease.    Right PICC projects in good position.    Chronic interstitial lung disease.      ____________________________________________   PROCEDURES  Procedure(s) performed: None  Procedures  Critical Care performed: No  ____________________________________________   INITIAL IMPRESSION / ASSESSMENT AND PLAN / ED COURSE  Pertinent labs & imaging results that were available during my care of the patient were reviewed by me and considered in my medical decision making (see chart for details).  Milinda AntisJason A Montavon is a 30 y.o. male with  medical history of schizoaffective disorder, bipolar disorder, chronic kidney disease stage III, diabetes, severe chronic interstitial lung disease not a candidate for transplant secondary to his psychiatric illness presenting with sob today. On exam, he is tachycardic and tachypneic with increased work of breathing. Oxygen saturation is at 85% on 3 L of supplemental oxygen, he has an increased oxygen requirement, typically only requires 2 L at home. Because he is tachycardic and given his history of interstitial lung disease, I have ordered lab albuterol as well as Solu-Medrol as this may be an exacerbation of his underlying interstitial lung disease. He does have recurrent HCAP PNA is a PICC line in Lasix and is certainly at risk for sepsis. I ordered fluid, blood cultures and a lactic acid. We'll treat empirically for possible Vancomycin, Zosyn, Levaquin. Labs, chest x-ray pending as is EKG, anticipate  admission.  ----------------------------------------- 2:05 PM on 12/17/2015 -----------------------------------------  Chest x-ray shows findings of chronic interstitial lung disease. Reassuring lactic acid and I suspect that his increased work of breathing  And tachycardia are secondary to acute exacerbation of his chronic interstitial lung disease as opposed to an actual bacterial sepsis. He did receive some IV fluids as well as level albuterol and his heart rate has improved to 113 bpm at this time, respiratory rate still in the 30s but he appears more  comfortable. Troponin is chronically elevated at 0.05. CBC and BNP pending at the time of admission. Case discussed with the hospitalist for admission.  Clinical Course     ____________________________________________   FINAL CLINICAL IMPRESSION(S) / ED DIAGNOSES  Final diagnoses:  SOB (shortness of breath)  Acute respiratory failure with hypoxia (HCC)  Interstitial lung disease (HCC)      NEW MEDICATIONS STARTED DURING THIS VISIT:  New Prescriptions   No medications on file     Note:  This document was prepared using Dragon voice recognition software and may include unintentional dictation errors.    Gayla Doss, MD 12/17/15 1408    Gayla Doss, MD 12/17/15 1409

## 2015-12-17 NOTE — Progress Notes (Signed)
Patient had another episode of increased respirations and anxiety, gave patient morphine as ordered. Patient reported relief following medication administration.

## 2015-12-17 NOTE — Consult Note (Signed)
Name: Walter Wong MRN: [id number] DOB: 01-15-1986    ADMISSION DATE:  12/17/2015 CONSULTATION DATE:  12/17/2015  REFERRING MD :  Dr. Elpidio Anis  CHIEF COMPLAINT:  Shortness of Breath  BRIEF PATIENT DESCRIPTION:  30 yo male who presented to [facility] ER on 10/6 with acute on chronic hypoxic respiratory failure secondary to interstitial lung disease vs HCAP  SIGNIFICANT EVENTS  10/6-Pt admitted to [facility] due to acute on chronic hypoxic respiratory failure secondary to interstitial lung disease 10/6-PCCM consulted for additional management  STUDIES:  None  HISTORY OF PRESENT ILLNESS:   This is a 30 yo male with a PMH of Schizophrenia, Interstitial lung disease, Type II DM, CKD Stage III, Diastolic CHF, Chronic home O2 4L, Bipolar disorder, Developmental disability/autism, and ADHD.  He presented to [facility] ER from [facility] on 10/6 with c/o shortness of breath and cough on his baseline home O2 at 4L.  In the ER he was tachypneic with a respiratory rate in the 30's and mildly tachycardic.  PCCM consulted due to acute on chronic hypoxic respiratory failure secondary to Interstitial lung disease vs HCAP.  PAST MEDICAL HISTORY :   has a past medical history of ADHD (attention deficit hyperactivity disorder); Bipolar disorder (HCC); CHF (congestive heart failure) (HCC); Chronic kidney disease; Diabetes mellitus, type II (HCC); Interstitial lung disease (HCC); and Schizophrenia (HCC).  has a past surgical history that includes Dental surgery and Esophagogastroduodenoscopy (egd) with propofol (N/A, 10/12/2015). Prior to Admission medications   Medication Sig Start Date End Date Taking? Authorizing Provider  acetaminophen (TYLENOL) 325 MG tablet Take 650 mg by mouth every 6 (six) hours as needed.   Yes Historical Provider, MD  carbamazepine (TEGRETOL) 200 MG tablet Take 1 tablet (200 mg total) by mouth 2 (two) times daily. 05/13/15  Yes Audery Amel, MD  diphenhydrAMINE (BENADRYL) 25 MG tablet Take 25 mg  by mouth at bedtime as needed for sleep.   Yes Historical Provider, MD  pantoprazole (PROTONIX) 40 MG tablet Take 1 tablet (40 mg total) by mouth 2 (two) times daily. 10/13/15  Yes Enid Baas, MD  polyethylene glycol (MIRALAX / GLYCOLAX) packet Take 17 g by mouth daily.   Yes Historical Provider, MD  predniSONE (DELTASONE) 20 MG tablet Take 20 mg by mouth. Give 0.5 to 1 tablet daily related to acute and chronic respiratory failure.   Yes Historical Provider, MD  QUEtiapine (SEROQUEL) 100 MG tablet Take 1 tablet (100 mg total) by mouth daily. 05/13/15  Yes Audery Amel, MD  QUEtiapine (SEROQUEL) 300 MG tablet Take 2 tablets (600 mg total) by mouth at bedtime. 05/13/15  Yes Audery Amel, MD  thiothixene (NAVANE) 10 MG capsule Take 1 capsule (10 mg total) by mouth 2 (two) times daily. 05/13/15  Yes Audery Amel, MD  tiotropium (SPIRIVA HANDIHALER) 18 MCG inhalation capsule Place 1 capsule (18 mcg total) into inhaler and inhale daily. 08/30/15 08/29/16 Yes Shane Crutch, MD  torsemide (DEMADEX) 10 MG tablet Take 10 mg by mouth daily.   Yes Historical Provider, MD  traMADol (ULTRAM) 50 MG tablet Take by mouth every 6 (six) hours as needed.   Yes Historical Provider, MD  amoxicillin-clavulanate (AUGMENTIN) 875-125 MG tablet Take 1 tablet by mouth 2 (two) times daily. Patient not taking: Reported on 12/17/2015 10/26/15   Houston Siren, MD   No Known Allergies  FAMILY HISTORY:  family history includes Congestive Heart Failure in his mother; Kidney failure in his mother. SOCIAL HISTORY:  reports that  he has never smoked. He has never used smokeless tobacco. He reports that he does not drink alcohol or use drugs.  REVIEW OF SYSTEMS:  Positives in BOLD Constitutional: Negative for fever, chills, weight loss, malaise/fatigue and diaphoresis.  HENT: Negative for hearing loss, ear pain, nosebleeds, congestion, sore throat, neck pain, tinnitus and ear discharge.   Eyes: Negative for blurred vision,  double vision, photophobia, pain, discharge and redness.  Respiratory: cough, hemoptysis, sputum production, shortness of breath, wheezing and stridor.   Cardiovascular: Negative for chest pain, palpitations, orthopnea, claudication, leg swelling and PND.  Gastrointestinal: Negative for heartburn, nausea, vomiting, abdominal pain, diarrhea, constipation, blood in stool and melena.  Genitourinary: Negative for dysuria, urgency, frequency, hematuria and flank pain.  Musculoskeletal: Negative for myalgias, back pain, joint pain and falls.  Skin: Negative for itching and rash.  Neurological: Negative for dizziness, tingling, tremors, sensory change, speech change, focal weakness, seizures, loss of consciousness, weakness and headaches.  Endo/Heme/Allergies: Negative for environmental allergies and polydipsia. Does not bruise/bleed easily.  SUBJECTIVE:   VITAL SIGNS: Temp:  [98.1 F (36.7 C)-99.5 F (37.5 C)] 98.1 F (36.7 C) (10/06 1543) Pulse Rate:  [110-135] 135 (10/06 1543) Resp:  [20-62] 62 (10/06 1543) BP: (98-138)/(69-115) 133/83 (10/06 1543) SpO2:  [92 %-100 %] 92 % (10/06 1543) Weight:  [88.6 kg (195 lb 6 oz)] 88.6 kg (195 lb 6 oz) (10/06 1035)  PHYSICAL EXAMINATION: General:  Acutely ill appearing AA male Neuro:  Agitated, alert disoriented to situation, follows commands, PERRLA HEENT:  Supple, no JVD Cardiovascular:  Tachycardic, s1s2, no M/R/G Lungs:  Coarse throughout, labored on nonrebreather Abdomen:  +BS x4, soft, non tender, non distended Musculoskeletal:  Normal bulk and tone Skin:  Intact no rashes or lesions   Recent Labs Lab 12/17/15 1134  NA 136  K 3.9  CL 96*  CO2 31  BUN 40*  CREATININE 2.11*  GLUCOSE 106*    Recent Labs Lab 12/17/15 1207  HGB 10.6*  HCT 34.3*  WBC 13.6*  PLT 369   Dg Chest Portable 1 View  Result Date: 12/17/2015 CLINICAL DATA:  Shortness of breath and difficulty breathing beginning this morning. History of interstitial lung  disease. EXAM: PORTABLE CHEST 1 VIEW COMPARISON:  CT chest 08/18/2015. Single-view of the chest 08/17/2015 and 10/22/2015. FINDINGS: Right PICC is in place with the tip near the superior cavoatrial junction. Lung volumes are low. Changes of chronic interstitial lung disease are stable in appearance. The lungs are otherwise unremarkable. No pneumothorax or pleural effusion. IMPRESSION: No acute disease. Right PICC projects in good position. Chronic interstitial lung disease. Electronically Signed   By: Drusilla Kannerhomas  Dalessio M.D.   On: 12/17/2015 11:19    ASSESSMENT / PLAN:  Acute on chronic hypoxic respiratory failure likely secondary to interstitial lung disease vs. ?HCAP Hx: Diastolic congestive heart failure Continue Levaquin and zosyn Follow cultures Continue iv steroids will wean as tolerated Continue aggressive bronchodilators HFNC for dyspnea wean as tolerated Supplemental O2 as needed  Maintain O2 sats 88% or above or for dyspnea Prn Morphine for dyspnea Will continue to treat interstitial lung disease conservatively Developmental disability/autism complicates therapy given pts inability to understand many therapies and interventions CXR in am  Sonda Rumbleana Kyaire Gruenewald, [facility] Communities HospitalGNP  Pulmonary/Critical Care Pager [phone] (please enter 7 digits) PCCM Consult Pager [phone] (please enter 7 digits)

## 2015-12-17 NOTE — ED Triage Notes (Signed)
Pt presents to ED from [facility] for SOB. At facility pt was tachypneic with anxiety and distress. Pt alert and oriented on arrival; tachycardia on monitor. O2 sat 94 on 3 L [place].

## 2015-12-17 NOTE — ED Notes (Signed)
3 attempts made at obtaining blood cultures. All atempts unsuccessful.  Pt refusing to let RNs attempt again to obtain cultures. Phlebotomy called.

## 2015-12-18 ENCOUNTER — Inpatient Hospital Stay: Payer: Medicare Other

## 2015-12-18 LAB — GLUCOSE, CAPILLARY
Glucose-Capillary: 93 mg/dL (ref 65–99)
Glucose-Capillary: 95 mg/dL (ref 65–99)
Glucose-Capillary: 132 mg/dL — ABNORMAL HIGH (ref 65–99)
Glucose-Capillary: 120 mg/dL — ABNORMAL HIGH (ref 65–99)

## 2015-12-18 MED ORDER — IPRATROPIUM-ALBUTEROL 0.5-2.5 (3) MG/3ML IN SOLN
3.0000 mL | Freq: Four times a day (QID) | RESPIRATORY_TRACT | Status: DC
  Administered 2015-12-18 – 2015-12-20 (×8): 3 mL via RESPIRATORY_TRACT
  Filled 2015-12-18 (×8): qty 3

## 2015-12-18 NOTE — Progress Notes (Signed)
[employer] - [place] at [facility]   PATIENT NAME: Walter Wong    MR#:  [id number]  DATE OF BIRTH:  12-Feb-1986  SUBJECTIVE:   Patient here due to shortness of breath, acute on chronic respiratory failure with hypoxia. Feels like his breathing is better since yesterday. No fever, positive cough but nonproductive.  REVIEW OF SYSTEMS:    Review of Systems  Constitutional: Negative for chills and fever.  HENT: Negative for congestion and tinnitus.   Eyes: Negative for blurred vision and double vision.  Respiratory: Positive for cough and shortness of breath. Negative for wheezing.   Cardiovascular: Negative for chest pain, orthopnea and PND.  Gastrointestinal: Negative for abdominal pain, diarrhea, nausea and vomiting.  Genitourinary: Negative for dysuria and hematuria.  Neurological: Negative for dizziness, sensory change and focal weakness.  All other systems reviewed and are negative.   Nutrition: Carb modified Tolerating Diet: Yes Tolerating PT: Ambulatory   DRUG ALLERGIES:  No Known Allergies  VITALS:  Blood pressure 109/67, pulse 100, temperature 97.7 F (36.5 C), temperature source Oral, resp. rate 17, height 5\' 6"  (1.676 m), weight 88.6 kg (195 lb 6 oz), SpO2 95 %.  PHYSICAL EXAMINATION:   Physical Exam  GENERAL:  30 y.o.-year-old patient lying in the bed in no acute distress.  EYES: Pupils equal, round, reactive to light and accommodation. No scleral icterus. Extraocular muscles intact.  HEENT: Head atraumatic, normocephalic. Oropharynx and nasopharynx clear.  NECK:  Supple, no jugular venous distention. No thyroid enlargement, no tenderness.  LUNGS: Tachpnic but that's chronic for the patient, no wheezing, bibasilar rales, No rhonchi. No use of accessory muscles of respiration.  CARDIOVASCULAR: S1, S2 normal. No murmurs, rubs, or gallops.  ABDOMEN: Soft, nontender, nondistended. Bowel sounds present. No organomegaly or mass.  EXTREMITIES: No  cyanosis, clubbing or edema b/l.    NEUROLOGIC: Cranial nerves II through XII are intact. No focal Motor or sensory deficits b/l.   PSYCHIATRIC: The patient is alert and oriented x 3.  SKIN: No obvious rash, lesion, or ulcer.    LABORATORY PANEL:   CBC  Recent Labs Lab 12/17/15 1207  WBC 13.6*  HGB 10.6*  HCT 34.3*  PLT 369   ------------------------------------------------------------------------------------------------------------------  Chemistries   Recent Labs Lab 12/17/15 1134  NA 136  K 3.9  CL 96*  CO2 31  GLUCOSE 106*  BUN 40*  CREATININE 2.11*  CALCIUM 9.0  AST 15  ALT 11*  ALKPHOS 54  BILITOT 0.3   ------------------------------------------------------------------------------------------------------------------  Cardiac Enzymes  Recent Labs Lab 12/17/15 1134  TROPONINI 0.05*   ------------------------------------------------------------------------------------------------------------------  RADIOLOGY:  Dg Chest Port 1 View  Result Date: 12/18/2015 CLINICAL DATA:  Acute respiratory failure.  Cough for 2 days EXAM: PORTABLE CHEST 1 VIEW COMPARISON:  December 17, 2015 and October 11, 2015 FINDINGS: Central catheter tip is in the superior vena cava. Shallow degree of inspiration remains. There is chronic interstitial fibrosis in the lung bases which is essentially stable. No new opacity is evident. Heart is prominent with mild pulmonary venous hypertension. No adenopathy evident. IMPRESSION: Findings indicative of a degree of pulmonary vascular congestion superimposed on chronic interstitial fibrotic type change. Shallow degree of inspiration. No new opacity. Central catheter tip in superior vena cava. No pneumothorax. Electronically Signed   By: Bretta Bang III M.D.   On: 12/18/2015 07:27   Dg Chest Portable 1 View  Result Date: 12/17/2015 CLINICAL DATA:  Shortness of breath and difficulty breathing beginning this morning. History of interstitial lung  disease. EXAM: PORTABLE CHEST 1 VIEW COMPARISON:  CT chest 08/18/2015. Single-view of the chest 08/17/2015 and 10/22/2015. FINDINGS: Right PICC is in place with the tip near the superior cavoatrial junction. Lung volumes are low. Changes of chronic interstitial lung disease are stable in appearance. The lungs are otherwise unremarkable. No pneumothorax or pleural effusion. IMPRESSION: No acute disease. Right PICC projects in good position. Chronic interstitial lung disease. Electronically Signed   By: Drusilla Kannerhomas  Dalessio M.D.   On: 12/17/2015 11:19     ASSESSMENT AND PLAN:   30 year old male with past medical history of chronic lung disease, chronic kidney disease stage III, schizoaffective disorder, diabetes, bipolar disorder, ADHD who presented to the hospital due to shortness of breath.  1. Acute on chronic respiratory failure with hypoxia-suspected to be secondary to mild pneumonia with underlying chronic interstitial lung disease. -Continue IV Levaquin, continue aggressive pulmonary toileting. -We'll wean O2 as tolerated.  2. Pneumonia-continue IV Levaquin, follow serum, blood cultures.  3. History of chronic lung disease-patient has chronic interstitial lung disease -Has been seen by pulmonary in the past, not a good candidate for lung transplant given his psychiatric history. Continue supportive care with O2 supplementation. -Continue when necessary DuoNeb's. - cont. PRN morphine for air hunger  4. History of schizoaffective disorder-continue Seroquel  5. GERD - cont. Protonix.    6. Hx of CKD Stage III - cr. Close to baseline.  Will cont. To monitor.  - no evidence of volume overload.   All the records are reviewed and case discussed with Care Management/Social Worker. Management plans discussed with the patient, family and they are in agreement.  CODE STATUS: Full   DVT Prophylaxis: Lovenox  TOTAL TIME TAKING CARE OF THIS PATIENT: 30 minutes.   POSSIBLE D/C IN 1-2 DAYS,  DEPENDING ON CLINICAL CONDITION.   Houston SirenSAINANI,VIVEK J M.D on 12/18/2015 at 2:39 PM  Between 7am to 6pm - Pager - [phone]  After 6pm go to [email or web address] - Scientist, research (life sciences)password EPAS [facility]  [facility] [place] [facility]  Office  [phone]  CC: Primary care physician; Mickel FuchsWROTH, THOMAS H, MD

## 2015-12-19 LAB — BASIC METABOLIC PANEL
Anion gap: 9 (ref 5–15)
BUN: 38 mg/dL — AB (ref 6–20)
CHLORIDE: 98 mmol/L — AB (ref 101–111)
CO2: 29 mmol/L (ref 22–32)
CREATININE: 2 mg/dL — AB (ref 0.61–1.24)
Calcium: 9.2 mg/dL (ref 8.9–10.3)
GFR calc Af Amer: 50 mL/min — ABNORMAL LOW (ref >60)
GFR, EST NON AFRICAN AMERICAN: 43 mL/min — AB (ref >60)
Glucose, Bld: 103 mg/dL — ABNORMAL HIGH (ref 65–99)
POTASSIUM: 4.8 mmol/L (ref 3.5–5.1)
Sodium: 136 mmol/L (ref 135–145)

## 2015-12-19 LAB — GLUCOSE, CAPILLARY
GLUCOSE-CAPILLARY: 100 mg/dL — AB (ref 65–99)
GLUCOSE-CAPILLARY: 142 mg/dL — AB (ref 65–99)
Glucose-Capillary: 97 mg/dL (ref 65–99)
Glucose-Capillary: 103 mg/dL — ABNORMAL HIGH (ref 65–99)

## 2015-12-19 LAB — CBC
HEMATOCRIT: 28.2 % — AB (ref 40.0–52.0)
Hemoglobin: 8.8 g/dL — ABNORMAL LOW (ref 13.0–18.0)
MCH: 21.9 pg — ABNORMAL LOW (ref 26.0–34.0)
MCHC: 31.1 g/dL — ABNORMAL LOW (ref 32.0–36.0)
MCV: 70.3 fL — ABNORMAL LOW (ref 80.0–100.0)
PLATELETS: 328 10*3/uL (ref 150–440)
RBC: 4.01 MIL/uL — AB (ref 4.40–5.90)
RDW: 14.4 % (ref 11.5–14.5)
WBC: 17.3 10*3/uL — AB (ref 3.8–10.6)

## 2015-12-19 LAB — MRSA PCR SCREENING: MRSA by PCR: NEGATIVE

## 2015-12-19 MED ORDER — METHYLPREDNISOLONE SODIUM SUCC 125 MG IJ SOLR
60.0000 mg | Freq: Every day | INTRAMUSCULAR | Status: DC
  Administered 2015-12-20: 60 mg via INTRAVENOUS
  Filled 2015-12-19: qty 2

## 2015-12-19 NOTE — Progress Notes (Signed)
Initial Nutrition Assessment  DOCUMENTATION CODES:   Obesity unspecified  INTERVENTION:  -Magic cup TID with meals, each supplement provides 290 kcal and 9 grams of protein -Encourage PO intake  NUTRITION DIAGNOSIS:   Inadequate oral intake related to poor appetite as evidenced by per patient/family report.  GOAL:   Patient will meet greater than or equal to 90% of their needs  MONITOR:   PO intake, I & O's, Labs, Weight trends  REASON FOR ASSESSMENT:   Malnutrition Screening Tool    ASSESSMENT:   Walter GoldmannJason Wong  is a 30 y.o. male with a known history of Hypertension, schizophrenia, diabetes on 4 L oxygen at baseline presents to the emergency room from local nursing home due to worsening shortness of breath. Patient was [facility] hospital [facility] 2017 [facility]  Mr. Hasten endorses eating "better" at home. Per chart he exhibits a 12#/5.8% insignificant wt loss over 2 months. Unable to designate cause of wt loss. He states he ate well this morning, sausage and eggs.  No acute complaints of nausea/vomiting/diarrhea/constipation or issues chewing/swallowing. Labs and medications reviewed.  Diet Order:  Diet Carb Modified Fluid consistency: Thin; Room service appropriate? Yes  Skin:  Reviewed, no issues  Last BM:  10/5  Height:   Ht Readings from Last 1 Encounters:  12/17/15 5\' 6"  (1.676 m)    Weight:   Wt Readings from Last 1 Encounters:  12/17/15 195 lb 6 oz (88.6 kg)    Ideal Body Weight:  64.54 kg  BMI:  Body mass index is 31.53 kg/m.  Estimated Nutritional Needs:   Kcal:  1700-2000 calories  Protein:  71-88 gm  Fluid:  >/= 1.7L  EDUCATION NEEDS:   No education needs identified at this time  Walter AnoWilliam M. Robertha Staples, MS, RD LDN Inpatient Clinical Dietitian Pager [phone]

## 2015-12-19 NOTE — Care Management Important Message (Signed)
Important Message  Patient Details  Name: Milinda AntisJason A Demond MRN: [id number] Date of Birth: 01/02/1986   Medicare Important Message Given:  Yes    Daijha Leggio A, RN 12/19/2015, 2:46 PM

## 2015-12-19 NOTE — Clinical Social Work Note (Signed)
Clinical Social Work Assessment  Patient Details  Name: Walter Wong MRN: [id number] Date of Birth: 09/23/1985  Date of referral:  12/19/15               Reason for consult:  Facility Placement                Permission sought to share information with:  Facility Industrial/product designerContact Representative Permission granted to share information::  Yes, Verbal Permission Granted  Name::        Agency::  [facility]  Relationship::     Contact Information:     Housing/Transportation Living arrangements for the past 2 months:  Skilled Nursing Facility Source of Information:  Facility Patient Interpreter Needed:  None Criminal Activity/Legal Involvement Pertinent to Current Situation/Hospitalization:  No - Comment as needed Significant Relationships:  Spouse, Other Family Members Lives with:  Facility Resident Do you feel safe going back to the place where you live?  Yes Need for family participation in patient care:  Yes (Comment)  Care giving concerns:  [facility]   Social Worker assessment / plan:  Patient was sleeping when CSW visited bedside. All information gleaned from chart review and discussion with facility representative, Christiane HaJonathan.  [facility] with SOB secondary from lung disease. The client is and LTC resident at [facility], and he had been in usual health until quite recently. The client has only his aunt as a support. The CSW attempted to contact his aunt, but she did not answer, and no option for leaving a message was present.   [facility] [place] [facility] the [facility] can [facility] when stable.  Employment status:  Disabled (Comment on whether or not currently receiving Disability) Insurance information:  Medicare PT Recommendations:  Not assessed at this time Information / Referral to community resources:     Patient/Family's Response to care:  The patient was sleeping and the family was unavailable.  Patient/Family's  Understanding of and Emotional Response to Diagnosis, Current Treatment, and Prognosis:  The facility is aware and willing to readmit the patient once stable.  Emotional Assessment Appearance:  Appears stated age Attitude/Demeanor/Rapport:   (Patient was sleeping) Affect (typically observed):   (Patient was sleeping) Orientation:   (Patient was sleeping) Alcohol / Substance use:  Never Used Psych involvement (Current and /or in the community):  Yes (Comment)  Discharge Needs  Concerns to be addressed:  Discharge Planning Concerns Readmission within the last 30 days:  No Current discharge risk:  None Barriers to Discharge:  Continued Medical Work up   [employer] CorporationKaren M Lew Prout, LCSW 12/19/2015, 2:26 PM

## 2015-12-19 NOTE — Progress Notes (Signed)
[employer] - [place] at [facility]   PATIENT NAME: Walter GoldmannJason Harbour    MR#:  [id number]  DATE OF BIRTH:  01/26/1986  SUBJECTIVE:   Patient here due to shortness of breath, acute on chronic respiratory failure with hypoxia. No acute events overnight. Feels better.   REVIEW OF SYSTEMS:    Review of Systems  Constitutional: Negative for chills and fever.  HENT: Negative for congestion and tinnitus.   Eyes: Negative for blurred vision and double vision.  Respiratory: Positive for cough and shortness of breath. Negative for wheezing.   Cardiovascular: Negative for chest pain, orthopnea and PND.  Gastrointestinal: Negative for abdominal pain, diarrhea, nausea and vomiting.  Genitourinary: Negative for dysuria and hematuria.  Neurological: Negative for dizziness, sensory change and focal weakness.  All other systems reviewed and are negative.   Nutrition: Carb modified Tolerating Diet: Yes Tolerating PT: Ambulatory   DRUG ALLERGIES:  No Known Allergies  VITALS:  Blood pressure 104/63, pulse 91, temperature 97.8 F (36.6 C), temperature source Oral, resp. rate 19, height 5\' 6"  (1.676 m), weight 88.6 kg (195 lb 6 oz), SpO2 96 %.  PHYSICAL EXAMINATION:   Physical Exam  GENERAL:  30 y.o.-year-old patient lying in the bed in no acute distress.  EYES: Pupils equal, round, reactive to light and accommodation. No scleral icterus. Extraocular muscles intact.  HEENT: Head atraumatic, normocephalic. Oropharynx and nasopharynx clear.  NECK:  Supple, no jugular venous distention. No thyroid enlargement, no tenderness.  LUNGS: Tachpnic but that's chronic for the patient, no wheezing, bibasilar rales, No rhonchi. No use of accessory muscles of respiration.  CARDIOVASCULAR: S1, S2 normal. No murmurs, rubs, or gallops.  ABDOMEN: Soft, nontender, nondistended. Bowel sounds present. No organomegaly or mass.  EXTREMITIES: No cyanosis, clubbing or edema b/l.    NEUROLOGIC: Cranial  nerves II through XII are intact. No focal Motor or sensory deficits b/l.   PSYCHIATRIC: The patient is alert and oriented x 3.  SKIN: No obvious rash, lesion, or ulcer.    LABORATORY PANEL:   CBC  Recent Labs Lab 12/19/15 0523  WBC 17.3*  HGB 8.8*  HCT 28.2*  PLT 328   ------------------------------------------------------------------------------------------------------------------  Chemistries   Recent Labs Lab 12/17/15 1134 12/19/15 0523  NA 136 136  K 3.9 4.8  CL 96* 98*  CO2 31 29  GLUCOSE 106* 103*  BUN 40* 38*  CREATININE 2.11* 2.00*  CALCIUM 9.0 9.2  AST 15  --   ALT 11*  --   ALKPHOS 54  --   BILITOT 0.3  --    ------------------------------------------------------------------------------------------------------------------  Cardiac Enzymes  Recent Labs Lab 12/17/15 1134  TROPONINI 0.05*   ------------------------------------------------------------------------------------------------------------------  RADIOLOGY:  Dg Chest Port 1 View  Result Date: 12/18/2015 CLINICAL DATA:  Acute respiratory failure.  Cough for 2 days EXAM: PORTABLE CHEST 1 VIEW COMPARISON:  December 17, 2015 and October 11, 2015 FINDINGS: Central catheter tip is in the superior vena cava. Shallow degree of inspiration remains. There is chronic interstitial fibrosis in the lung bases which is essentially stable. No new opacity is evident. Heart is prominent with mild pulmonary venous hypertension. No adenopathy evident. IMPRESSION: Findings indicative of a degree of pulmonary vascular congestion superimposed on chronic interstitial fibrotic type change. Shallow degree of inspiration. No new opacity. Central catheter tip in superior vena cava. No pneumothorax. Electronically Signed   By: Bretta BangWilliam  Woodruff III M.D.   On: 12/18/2015 07:27     ASSESSMENT AND PLAN:   30 year old male with past  medical history of chronic lung disease, chronic kidney disease stage III, schizoaffective  disorder, diabetes, bipolar disorder, ADHD who presented to the hospital due to shortness of breath.  1. Acute on chronic respiratory failure with hypoxia-suspected to be secondary to mild pneumonia with underlying chronic interstitial lung disease. -Continue IV Levaquin, continue aggressive pulmonary toileting. Improved since admission.  -back to his baseline O2 level.   2. Pneumonia-continue IV Levaquin - cultures so far (-).   3. History of chronic lung disease-patient has chronic interstitial lung disease -Has been seen by pulmonary in the past, not a good candidate for lung transplant given his psychiatric history. Continue supportive care with O2 supplementation. -Continue when necessary DuoNeb's. - cont. PRN morphine for air hunger  4. History of schizoaffective disorder-continue Seroquel  5. GERD - cont. Protonix.    6. Hx of CKD Stage III - cr. Close to baseline.  Will cont. To monitor.  - no evidence of volume overload.   Likely d/c back to SNF tomorrow.   All the records are reviewed and case discussed with Care Management/Social Worker. Management plans discussed with the patient, family and they are in agreement.  CODE STATUS: Full   DVT Prophylaxis: Lovenox  TOTAL TIME TAKING CARE OF THIS PATIENT: 25 minutes.   POSSIBLE D/C IN 1-2 DAYS, DEPENDING ON CLINICAL CONDITION.   [place] Siren M.D on 12/19/2015 at 12:37 PM  Between 7am to 6pm - Pager - [phone]  After 6pm go to [email or web address] - Scientist, research (life sciences) [place] [facility]  Office  [phone]  CC: Primary care physician; Mickel Fuchs, MD

## 2015-12-20 LAB — GLUCOSE, CAPILLARY: Glucose-Capillary: 93 mg/dL (ref 65–99)

## 2015-12-20 MED ORDER — LEVOFLOXACIN 500 MG PO TABS: 500.0000 mg | ORAL_TABLET | Freq: Every day | ORAL | 0 refills | Status: DC

## 2015-12-20 MED ORDER — LEVOFLOXACIN 500 MG PO TABS
500.0000 mg | ORAL_TABLET | Freq: Every day | ORAL | Status: DC
  Administered 2015-12-20: 500 mg via ORAL
  Filled 2015-12-20: qty 1

## 2015-12-20 NOTE — Care Management (Signed)
It appears patient is long term care resident of [facility] .  CSW is involved

## 2015-12-20 NOTE — Progress Notes (Signed)
Pt d/c to [facility] today.  PICC Line removed intact.  Report called to [facility].  Pt transported by EMS.

## 2015-12-20 NOTE — Clinical Social Work Note (Signed)
CSW attempted to reach patient's aunt in order to notify her that patient was returning to [facility] however the number listed has been disconnected. York SpanielMonica Marin Milley MSW,LCSW [phone]

## 2015-12-20 NOTE — Progress Notes (Signed)
PHARMACIST - PHYSICIAN COMMUNICATION  CONCERNING: Antibiotic IV to Oral Route Change Policy  RECOMMENDATION: This patient is receiving LEVOFLOXACIN by the intravenous route.  Based on criteria approved by the Pharmacy and Therapeutics Committee, the antibiotic(s) is/are being converted to the equivalent oral dose form(s).   DESCRIPTION: These criteria include:  Patient being treated for a respiratory tract infection, urinary tract infection, cellulitis or clostridium difficile associated diarrhea if on metronidazole  The patient is not neutropenic and does not exhibit a GI malabsorption state  The patient is eating (either orally or via tube) and/or has been taking other orally administered medications for a least 24 hours  The patient is improving clinically and has a Tmax < 100.5  If you have questions about this conversion, please contact the Pharmacy Department   [x]   [phone]( [phone] )  [facility]   Cher NakaiSheema Mussa Groesbeck, PharmD Clinical Pharmacist 12/20/2015 7:32 AM

## 2015-12-20 NOTE — Discharge Summary (Signed)
[employer] - [place] at [facility]   PATIENT NAME: Walter Wong    MR#:  [id number]  DATE OF BIRTH:  04-Jul-1985  DATE OF ADMISSION:  12/17/2015 ADMITTING PHYSICIAN: Milagros Loll, MD  DATE OF DISCHARGE: 12/20/2015  PRIMARY CARE PHYSICIAN: Mickel Fuchs, MD    ADMISSION DIAGNOSIS:  Interstitial lung disease (HCC) [J84.9] SOB (shortness of breath) [R06.02] Acute respiratory failure with hypoxia (HCC) [J96.01]  DISCHARGE DIAGNOSIS:  Active Problems:   ILD (interstitial lung disease) (HCC)   SECONDARY DIAGNOSIS:   Past Medical History:  Diagnosis Date  . ADHD (attention deficit hyperactivity disorder)   . Bipolar disorder (HCC)   . CHF (congestive heart failure) (HCC)    Diastolic CHF  . Chronic kidney disease    Stage 3  . Diabetes mellitus, type II (HCC)   . Interstitial lung disease (HCC)    on nocturnal home o2  . Schizophrenia [facility])     HOSPITAL COURSE:   30 year old male with past medical history of chronic lung disease, chronic kidney disease stage III, schizoaffective disorder, diabetes, bipolar disorder, ADHD who presented to the hospital due to shortness of breath.  1. Acute on chronic respiratory failure with hypoxia-suspected to be secondary to mild pneumonia with underlying chronic interstitial lung disease. - while in the hospital pt. Was treated with IV Steroids, Levaquin and has clinically imiproved.  He is back down to his baseline O2 at 3 L and doing well and therefore being discharged home on Oral Prednisone and Levaquin.   2. Pneumonia- while in the hospital pt. Was given IV Levaquin and now being discharged on Oral levaquin for a few more days.  His cultures have remained (-).   3. History of chronic lung disease-patient has chronic interstitial lung disease -Has been seen by pulmonary in the past, not a good candidate for lung transplant given his psychiatric history.  He will Continue supportive care with O2 supplementation. -  he will cont. His maintenance Prednisone.   4. History of schizoaffective disorder- he will continue Seroquel  5. GERD - he will cont. Protonix.    6. Hx of CKD Stage III - his Cr. Remained stable while in the hospital and can be further followed as outpatient.  - no evidence of volume overload while in the hospital.   DISCHARGE CONDITIONS:   Stable.   CONSULTS OBTAINED:  Treatment Team:  [facility], MD  DRUG ALLERGIES:  No Known Allergies  DISCHARGE MEDICATIONS:     Medication List    STOP taking these medications   amoxicillin-clavulanate 875-125 MG tablet Commonly known as:  AUGMENTIN     TAKE these medications   acetaminophen 325 MG tablet Commonly known as:  TYLENOL Take 650 mg by mouth every 6 (six) hours as needed.   carbamazepine 200 MG tablet Commonly known as:  TEGRETOL Take 1 tablet (200 mg total) by mouth 2 (two) times daily.   diphenhydrAMINE 25 MG tablet Commonly known as:  BENADRYL Take 25 mg by mouth at bedtime as needed for sleep.   levofloxacin 500 MG tablet Commonly known as:  LEVAQUIN Take 1 tablet (500 mg total) by mouth daily.   pantoprazole 40 MG tablet Commonly known as:  PROTONIX Take 1 tablet (40 mg total) by mouth 2 (two) times daily.   polyethylene glycol packet Commonly known as:  MIRALAX / GLYCOLAX Take 17 g by mouth daily.   predniSONE 20 MG tablet Commonly known as:  DELTASONE Take 20 mg by mouth. Give 0.5  to 1 tablet daily related to acute and chronic respiratory failure.   QUEtiapine 300 MG tablet Commonly known as:  SEROQUEL Take 2 tablets (600 mg total) by mouth at bedtime.   QUEtiapine 100 MG tablet Commonly known as:  SEROQUEL Take 1 tablet (100 mg total) by mouth daily.   thiothixene 10 MG capsule Commonly known as:  NAVANE Take 1 capsule (10 mg total) by mouth 2 (two) times daily.   tiotropium 18 MCG inhalation capsule Commonly known as:  SPIRIVA HANDIHALER Place 1 capsule (18 mcg total) into  inhaler and inhale daily.   torsemide 10 MG tablet Commonly known as:  DEMADEX Take 10 mg by mouth daily.   traMADol 50 MG tablet Commonly known as:  ULTRAM Take by mouth every 6 (six) hours as needed.         DISCHARGE INSTRUCTIONS:   DIET:  Cardiac diet and Renal diet  DISCHARGE CONDITION:  Stable  ACTIVITY:  Activity as tolerated  OXYGEN:  Home Oxygen: Yes.     Oxygen Delivery: 3 liters/min via Patient connected to nasal cannula oxygen  DISCHARGE LOCATION:  nursing home   If you experience worsening of your admission symptoms, develop shortness of breath, life threatening emergency, suicidal or homicidal thoughts you must seek medical attention immediately by calling 911 or calling your MD immediately  if symptoms less severe.  You Must read complete instructions/literature along with all the possible adverse reactions/side effects for all the Medicines you take and that have been prescribed to you. Take any new Medicines after you have completely understood and accpet all the possible adverse reactions/side effects.   Please note  You were cared for by a hospitalist during your hospital stay. If you have any questions about your discharge medications or the care you received while you were in the hospital after you are discharged, you can call the unit and asked to speak with the hospitalist on call if the hospitalist that took care of you is not available. Once you are discharged, your primary care physician will handle any further medical issues. Please note that NO REFILLS for any discharge medications will be authorized once you are discharged, as it is imperative that you return to your primary care physician (or establish a relationship with a primary care physician if you do not have one) for your aftercare needs so that they can reassess your need for medications and monitor your lab values.     Today   Shortness of breath much improved since admission.  No  fever, cough, productive sputum.   VITAL SIGNS:  Blood pressure 114/68, pulse 91, temperature 98.2 F (36.8 C), temperature source Oral, resp. rate (!) 22, height 5\' 6"  (1.676 m), weight 89.4 kg (197 lb 1.6 oz), SpO2 95 %.  I/O:   Intake/Output Summary (Last 24 hours) at 12/20/15 0906 Last data filed at 12/20/15 0832  Gross per 24 hour  Intake              700 ml  Output             2300 ml  Net            -1600 ml    PHYSICAL EXAMINATION:   GENERAL:  30 y.o.-year-old patient lying in the bed in no acute distress.  EYES: Pupils equal, round, reactive to light and accommodation. No scleral icterus. Extraocular muscles intact.  HEENT: Head atraumatic, normocephalic. Oropharynx and nasopharynx clear.  NECK:  Supple, no jugular venous distention.  No thyroid enlargement, no tenderness.  LUNGS: Tachpnic but that's chronic for the patient, no wheezing, bibasilar rales, No rhonchi. No use of accessory muscles of respiration.  CARDIOVASCULAR: S1, S2 normal. No murmurs, rubs, or gallops.  ABDOMEN: Soft, nontender, nondistended. Bowel sounds present. No organomegaly or mass.  EXTREMITIES: No cyanosis, clubbing or edema b/l.    NEUROLOGIC: Cranial nerves II through XII are intact. No focal Motor or sensory deficits b/l.   PSYCHIATRIC: The patient is alert and oriented x 3.  SKIN: No obvious rash, lesion, or ulcer.   DATA REVIEW:   CBC  Recent Labs Lab 12/19/15 0523  WBC 17.3*  HGB 8.8*  HCT 28.2*  PLT 328    Chemistries   Recent Labs Lab 12/17/15 1134 12/19/15 0523  NA 136 136  K 3.9 4.8  CL 96* 98*  CO2 31 29  GLUCOSE 106* 103*  BUN 40* 38*  CREATININE 2.11* 2.00*  CALCIUM 9.0 9.2  AST 15  --   ALT 11*  --   ALKPHOS 54  --   BILITOT 0.3  --     Cardiac Enzymes  Recent Labs Lab 12/17/15 1134  TROPONINI 0.05*    Microbiology Results  Results for orders placed or performed during the hospital encounter of 12/17/15  Blood culture (routine x 2)     Status:  None (Preliminary result)   Collection Time: 12/17/15 12:07 PM  Result Value Ref Range Status   Specimen Description BLOOD LEFT HAND  Final   Special Requests BOTTLES DRAWN AEROBIC AND ANAEROBIC  4CC  Final   Culture NO GROWTH 3 DAYS  Final   Report Status PENDING  Incomplete  Blood culture (routine x 2)     Status: None (Preliminary result)   Collection Time: 12/17/15 12:35 PM  Result Value Ref Range Status   Specimen Description BLOOD RIGHT HAND  Final   Special Requests   Final    BOTTLES DRAWN AEROBIC AND ANAEROBIC  AER 10CC ANA 5CC   Culture NO GROWTH 3 DAYS  Final   Report Status PENDING  Incomplete  MRSA PCR Screening     Status: None   Collection Time: 12/19/15  5:57 AM  Result Value Ref Range Status   MRSA by PCR NEGATIVE NEGATIVE Final    Comment:        The GeneXpert MRSA Assay (FDA approved for NASAL specimens only), is one component of a comprehensive MRSA colonization surveillance program. It is not intended to diagnose MRSA infection nor to guide or monitor treatment for MRSA infections.     RADIOLOGY:  No results found.    Management plans discussed with the patient, family and they are in agreement.  CODE STATUS:     Code Status Orders        Start     Ordered   12/17/15 1418  Full code  Continuous     12/17/15 1418    Code Status History    Date Active Date Inactive Code Status Order ID Comments User Context   10/22/2015  6:53 PM 10/26/2015  2:52 PM Full Code [id number]  Houston SirenVivek J Elfida Shimada, MD Inpatient   10/09/2015 11:28 AM 10/13/2015  3:22 PM Full Code [id number]  Enid Baasadhika Kalisetti, MD Inpatient   08/17/2015  2:35 PM 08/20/2015  5:27 PM Full Code [id number]  Enedina FinnerSona Patel, MD ED   01/22/2015  5:27 PM 01/24/2015  2:25 PM Full Code [id number]  Houston SirenVivek J Mcgwire Dasaro, MD Inpatient      TOTAL TIME  TAKING CARE OF THIS PATIENT: 40 minutes.    Houston Siren M.D on 12/20/2015 at 9:06 AM  Between 7am to 6pm - Pager - [phone]  After 6pm go to [email or web address] -  Scientist, research (life sciences) [place]  Office  [phone]  CC: Primary care physician; Mickel Fuchs, MD

## 2015-12-20 NOTE — Consult Note (Signed)
   Name: Milinda AntisJason A Gumina MRN: [id number] DOB: 05/17/1985    ADMISSION DATE:  12/17/2015 CONSULTATION DATE:  12/17/2015  REFERRING MD :  Dr. Elpidio AnisSudini  CHIEF COMPLAINT:  Shortness of Breath  BRIEF PATIENT DESCRIPTION:  30 yo male who [facility] with acute on chronic hypoxic respiratory failure secondary to interstitial lung disease vs HCAP  SIGNIFICANT EVENTS  10/6-[facility] due to acute on chronic hypoxic respiratory failure secondary to interstitial lung disease 10/6-PCCM consulted for additional management  REVIEW OF SYSTEMS:  Positives in BOLD Constitutional: Negative for fever, chills, weight loss, malaise/fatigue and diaphoresis.  HENT: Negative for hearing loss, ear pain, nosebleeds, congestion, sore throat, neck pain, tinnitus and ear discharge.   Eyes: Negative for blurred vision, double vision, photophobia, pain, discharge and redness.  Respiratory: cough, hemoptysis, sputum production, shortness of breath, wheezing and stridor.   Cardiovascular: Negative for chest pain, palpitations, orthopnea, claudication, leg swelling and PND.   Remained of the ROS was reviewed and was negative.  SUBJECTIVE:   VITAL SIGNS: Temp:  [98.1 F (36.7 C)-98.2 F (36.8 C)] 98.2 F (36.8 C) (10/09 0500) Pulse Rate:  [91-105] 91 (10/09 0500) Resp:  [20-24] 22 (10/09 0500) BP: (107-114)/(62-68) 114/68 (10/09 0500) SpO2:  [95 %-98 %] 95 % (10/09 0808) Weight:  [197 lb 1.6 oz (89.4 kg)] 197 lb 1.6 oz (89.4 kg) (10/09 0522)  PHYSICAL EXAMINATION: General:  Acutely ill appearing AA male Neuro:  Agitated, alert disoriented to situation, follows commands, PERRLA HEENT:  Supple, no JVD Cardiovascular:  Tachycardic, s1s2, no M/R/G Lungs:  Coarse throughout, non-labored breathing today.  Abdomen:  +BS x4, soft, non tender, non distended Musculoskeletal:  Normal bulk and tone Skin:  Intact no rashes or lesions   Recent Labs Lab 12/17/15 1134 12/19/15 0523  NA 136 136    K 3.9 4.8  CL 96* 98*  CO2 31 29  BUN 40* 38*  CREATININE 2.11* 2.00*  GLUCOSE 106* 103*    Recent Labs Lab 12/17/15 1207 12/19/15 0523  HGB 10.6* 8.8*  HCT 34.3* 28.2*  WBC 13.6* 17.3*  PLT 369 328   No results found.  ASSESSMENT / PLAN:  Acute on chronic hypoxic respiratory failure likely secondary to interstitial lung disease vs. ?HCAP Hx: Diastolic congestive heart failure Continue abx.  Follow cultures Steroids- wean as tolerated Continue  bronchodilators Supplemental O2 as needed  Maintain O2 sats 88% or above or for dyspnea Prn Morphine for dyspnea Will continue to treat interstitial lung disease conservatively Developmental disability/autism complicates therapy given pts inability to understand many therapies and interventions   Deep Nicholos Johnsamachandran, M.D.  12/20/2015

## 2015-12-21 ENCOUNTER — Inpatient Hospital Stay
Admission: EM | Admit: 2015-12-21 | Discharge: 2015-12-23 | DRG: 193 | Disposition: A | Discharge status: Skilled Nursing Facility | Payer: Medicare Other | Attending: Internal Medicine | Admitting: Internal Medicine

## 2015-12-21 ENCOUNTER — Emergency Department: Payer: Medicare Other

## 2015-12-21 DIAGNOSIS — F909 Attention-deficit hyperactivity disorder, unspecified type: Secondary | ICD-10-CM | POA: Diagnosis present

## 2015-12-21 DIAGNOSIS — N183 Chronic kidney disease, stage 3 (moderate): Secondary | ICD-10-CM | POA: Diagnosis present

## 2015-12-21 DIAGNOSIS — F840 Autistic disorder: Secondary | ICD-10-CM | POA: Diagnosis present

## 2015-12-21 DIAGNOSIS — IMO0001 Reserved for inherently not codable concepts without codable children: Secondary | ICD-10-CM

## 2015-12-21 DIAGNOSIS — I5032 Chronic diastolic (congestive) heart failure: Secondary | ICD-10-CM | POA: Diagnosis present

## 2015-12-21 DIAGNOSIS — J84112 Idiopathic pulmonary fibrosis: Secondary | ICD-10-CM | POA: Diagnosis present

## 2015-12-21 DIAGNOSIS — Z79899 Other long term (current) drug therapy: Secondary | ICD-10-CM | POA: Diagnosis not present

## 2015-12-21 DIAGNOSIS — J9621 Acute and chronic respiratory failure with hypoxia: Secondary | ICD-10-CM | POA: Diagnosis present

## 2015-12-21 DIAGNOSIS — Y95 Nosocomial condition: Secondary | ICD-10-CM | POA: Diagnosis present

## 2015-12-21 DIAGNOSIS — F319 Bipolar disorder, unspecified: Secondary | ICD-10-CM | POA: Diagnosis present

## 2015-12-21 DIAGNOSIS — F259 Schizoaffective disorder, unspecified: Secondary | ICD-10-CM | POA: Diagnosis present

## 2015-12-21 DIAGNOSIS — E1122 Type 2 diabetes mellitus with diabetic chronic kidney disease: Secondary | ICD-10-CM | POA: Diagnosis present

## 2015-12-21 DIAGNOSIS — J189 Pneumonia, unspecified organism: Principal | ICD-10-CM | POA: Diagnosis present

## 2015-12-21 DIAGNOSIS — J8410 Pulmonary fibrosis, unspecified: Secondary | ICD-10-CM | POA: Diagnosis not present

## 2015-12-21 DIAGNOSIS — F419 Anxiety disorder, unspecified: Secondary | ICD-10-CM | POA: Diagnosis present

## 2015-12-21 DIAGNOSIS — R0602 Shortness of breath: Secondary | ICD-10-CM | POA: Diagnosis present

## 2015-12-21 DIAGNOSIS — J9600 Acute respiratory failure, unspecified whether with hypoxia or hypercapnia: Secondary | ICD-10-CM | POA: Diagnosis present

## 2015-12-21 LAB — CBC WITH DIFFERENTIAL/PLATELET
BAND NEUTROPHILS: 0 %
BASOS ABS: 0 10*3/uL (ref 0–0.1)
BASOS PCT: 0 %
Blasts: 0 %
EOS ABS: 0.6 10*3/uL (ref 0–0.7)
EOS PCT: 2 %
HCT: 34.7 % — ABNORMAL LOW (ref 40.0–52.0)
Hemoglobin: 10.8 g/dL — ABNORMAL LOW (ref 13.0–18.0)
LYMPHS ABS: 6 10*3/uL — AB (ref 1.0–3.6)
LYMPHS PCT: 21 %
MCH: 21.8 pg — ABNORMAL LOW (ref 26.0–34.0)
MCHC: 31 g/dL — AB (ref 32.0–36.0)
MCV: 70.1 fL — ABNORMAL LOW (ref 80.0–100.0)
METAMYELOCYTES PCT: 0 %
MONOS PCT: 13 %
Monocytes Absolute: 3.7 10*3/uL — ABNORMAL HIGH (ref 0.2–1.0)
Myelocytes: 0 %
NEUTROS ABS: 18.1 10*3/uL — AB (ref 1.4–6.5)
Neutrophils Relative %: 64 %
OTHER: 0 %
PLATELETS: 473 10*3/uL — AB (ref 150–440)
Promyelocytes Absolute: 0 %
RBC: 4.95 MIL/uL (ref 4.40–5.90)
RDW: 14.8 % — AB (ref 11.5–14.5)
WBC: 28.4 10*3/uL — ABNORMAL HIGH (ref 3.8–10.6)
nRBC: 0 /100 WBC

## 2015-12-21 LAB — LACTIC ACID, PLASMA: Lactic Acid, Venous: 1.1 mmol/L (ref 0.5–1.9)

## 2015-12-21 LAB — COMPREHENSIVE METABOLIC PANEL
ALBUMIN: 3.5 g/dL (ref 3.5–5.0)
ALT: 20 U/L (ref 17–63)
ANION GAP: 12 (ref 5–15)
AST: 23 U/L (ref 15–41)
Alkaline Phosphatase: 58 U/L (ref 38–126)
BUN: 40 mg/dL — AB (ref 6–20)
CHLORIDE: 95 mmol/L — AB (ref 101–111)
CO2: 30 mmol/L (ref 22–32)
Calcium: 9.4 mg/dL (ref 8.9–10.3)
Creatinine, Ser: 2.52 mg/dL — ABNORMAL HIGH (ref 0.61–1.24)
GFR calc Af Amer: 38 mL/min — ABNORMAL LOW (ref >60)
GFR, EST NON AFRICAN AMERICAN: 33 mL/min — AB (ref >60)
GLUCOSE: 130 mg/dL — AB (ref 65–99)
POTASSIUM: 3.9 mmol/L (ref 3.5–5.1)
Sodium: 137 mmol/L (ref 135–145)
TOTAL PROTEIN: 7.9 g/dL (ref 6.5–8.1)
Total Bilirubin: <0.1 mg/dL — ABNORMAL LOW (ref 0.3–1.2)

## 2015-12-21 LAB — BLOOD GAS, ARTERIAL
ACID-BASE EXCESS: 6.9 mmol/L — AB (ref 0.0–2.0)
BICARBONATE: 32.4 mmol/L — AB (ref 20.0–28.0)
Delivery systems: POSITIVE
Expiratory PAP: 6
FIO2: 0.4
Inspiratory PAP: 12
O2 SAT: 92.2 %
PATIENT TEMPERATURE: 37
PCO2 ART: 50 mmHg — AB (ref 32.0–48.0)
PO2 ART: 63 mmHg — AB (ref 83.0–108.0)
pH, Arterial: 7.42 (ref 7.350–7.450)

## 2015-12-21 LAB — GLUCOSE, CAPILLARY
GLUCOSE-CAPILLARY: 113 mg/dL — AB (ref 65–99)
GLUCOSE-CAPILLARY: 140 mg/dL — AB (ref 65–99)

## 2015-12-21 LAB — TROPONIN I: TROPONIN I: 0.03 ng/mL — AB (ref <0.03)

## 2015-12-21 MED ORDER — HEPARIN SODIUM (PORCINE) 5000 UNIT/ML IJ SOLN
5000.0000 [IU] | Freq: Three times a day (TID) | INTRAMUSCULAR | Status: DC
  Filled 2015-12-21: qty 1

## 2015-12-21 MED ORDER — VANCOMYCIN HCL IN DEXTROSE 1-5 GM/200ML-% IV SOLN
1000.0000 mg | Freq: Once | INTRAVENOUS | Status: AC
  Administered 2015-12-21: 1000 mg via INTRAVENOUS
  Filled 2015-12-21: qty 200

## 2015-12-21 MED ORDER — PIPERACILLIN-TAZOBACTAM 3.375 G IVPB
3.3750 g | Freq: Three times a day (TID) | INTRAVENOUS | Status: DC
  Administered 2015-12-22 – 2015-12-23 (×5): 3.375 g via INTRAVENOUS
  Filled 2015-12-21 (×5): qty 50

## 2015-12-21 MED ORDER — SODIUM CHLORIDE 0.9 % IV BOLUS (SEPSIS)
1000.0000 mL | Freq: Once | INTRAVENOUS | Status: AC
  Administered 2015-12-21: 1000 mL via INTRAVENOUS

## 2015-12-21 MED ORDER — VANCOMYCIN HCL IN DEXTROSE 1-5 GM/200ML-% IV SOLN
1000.0000 mg | Freq: Two times a day (BID) | INTRAVENOUS | Status: DC
  Administered 2015-12-21: 1000 mg via INTRAVENOUS
  Filled 2015-12-21 (×2): qty 200

## 2015-12-21 MED ORDER — ACETAMINOPHEN 325 MG PO TABS: 650.0000 mg | ORAL_TABLET | ORAL | Status: DC | PRN

## 2015-12-21 MED ORDER — INSULIN ASPART 100 UNIT/ML ~~LOC~~ SOLN
2.0000 [IU] | SUBCUTANEOUS | Status: DC
  Administered 2015-12-21: 2 [IU] via SUBCUTANEOUS
  Filled 2015-12-21: qty 2

## 2015-12-21 MED ORDER — PIPERACILLIN-TAZOBACTAM 3.375 G IVPB 30 MIN
3.3750 g | Freq: Once | INTRAVENOUS | Status: AC
  Administered 2015-12-21: 3.375 g via INTRAVENOUS
  Filled 2015-12-21: qty 50

## 2015-12-21 MED ORDER — IPRATROPIUM-ALBUTEROL 0.5-2.5 (3) MG/3ML IN SOLN: 3.0000 mL | RESPIRATORY_TRACT | Status: DC

## 2015-12-21 MED ORDER — VANCOMYCIN HCL IN DEXTROSE 1-5 GM/200ML-% IV SOLN
1000.0000 mg | INTRAVENOUS | Status: DC
  Filled 2015-12-21: qty 200

## 2015-12-21 MED ORDER — CHLORHEXIDINE GLUCONATE 0.12 % MT SOLN
15.0000 mL | Freq: Two times a day (BID) | OROMUCOSAL | Status: DC
  Administered 2015-12-21 – 2015-12-22 (×2): 15 mL via OROMUCOSAL
  Filled 2015-12-21 (×2): qty 15

## 2015-12-21 MED ORDER — QUETIAPINE FUMARATE ER 50 MG PO TB24
100.0000 mg | ORAL_TABLET | Freq: Every day | ORAL | Status: DC
  Administered 2015-12-21 – 2015-12-23 (×3): 100 mg via ORAL
  Filled 2015-12-21 (×3): qty 2

## 2015-12-21 MED ORDER — METHYLPREDNISOLONE SODIUM SUCC 125 MG IJ SOLR
125.0000 mg | Freq: Once | INTRAMUSCULAR | Status: AC
  Administered 2015-12-21: 125 mg via INTRAVENOUS
  Filled 2015-12-21: qty 2

## 2015-12-21 MED ORDER — TIOTROPIUM BROMIDE MONOHYDRATE 18 MCG IN CAPS
18.0000 ug | ORAL_CAPSULE | Freq: Every day | RESPIRATORY_TRACT | Status: DC
  Administered 2015-12-22 – 2015-12-23 (×2): 18 ug via RESPIRATORY_TRACT
  Filled 2015-12-21: qty 5

## 2015-12-21 MED ORDER — IPRATROPIUM-ALBUTEROL 0.5-2.5 (3) MG/3ML IN SOLN
3.0000 mL | RESPIRATORY_TRACT | Status: DC | PRN
  Administered 2015-12-23: 11:00:00 3 mL via RESPIRATORY_TRACT
  Filled 2015-12-21: qty 3

## 2015-12-21 MED ORDER — THIOTHIXENE 10 MG PO CAPS
10.0000 mg | ORAL_CAPSULE | Freq: Two times a day (BID) | ORAL | Status: DC
  Administered 2015-12-21 – 2015-12-23 (×4): 10 mg via ORAL
  Filled 2015-12-21 (×5): qty 1

## 2015-12-21 MED ORDER — CARBAMAZEPINE 200 MG PO TABS
200.0000 mg | ORAL_TABLET | Freq: Two times a day (BID) | ORAL | Status: DC
  Administered 2015-12-22 – 2015-12-23 (×3): 200 mg via ORAL
  Filled 2015-12-21 (×3): qty 1

## 2015-12-21 MED ORDER — LEVOFLOXACIN IN D5W 750 MG/150ML IV SOLN
750.0000 mg | Freq: Once | INTRAVENOUS | Status: AC
  Administered 2015-12-21: 750 mg via INTRAVENOUS
  Filled 2015-12-21: qty 150

## 2015-12-21 MED ORDER — LEVOFLOXACIN IN D5W 750 MG/150ML IV SOLN: 750.0000 mg | INTRAVENOUS | Status: DC

## 2015-12-21 MED ORDER — SODIUM CHLORIDE 0.9 % IV SOLN: 250.0000 mL | INTRAVENOUS | Status: DC | PRN

## 2015-12-21 MED ORDER — ORAL CARE MOUTH RINSE
15.0000 mL | Freq: Two times a day (BID) | OROMUCOSAL | Status: DC
  Administered 2015-12-22: 15 mL via OROMUCOSAL

## 2015-12-21 MED ORDER — LEVOFLOXACIN 500 MG PO TABS: 500.0000 mg | ORAL_TABLET | Freq: Every day | ORAL | Status: DC

## 2015-12-21 NOTE — Consult Note (Signed)
Pharmacy Antibiotic Note  Walter Wong is a 30 y.o. male admitted on 12/21/2015 with pneumonia.  Pharmacy has been consulted for vancomycin dosing. Pt was d/c on 10/6 with levofloxacin for intersitial lung disease vs HCAP. Pt is also on zosyn  Plan: Pt received a total of 2g of vancomycin this evening. This is a sufficent load, therefore will not do stack dosing.  Vancomycin 1000mg   IV every 24 hours.  Goal trough 15-20 mcg/mL.  Trough at steady state 10/14 @ 2200  Height: 5\' 6"  (167.6 cm) Weight: 155 lb 6.8 oz (70.5 kg) IBW/kg (Calculated) : 63.8  Temp (24hrs), Avg:99.1 F (37.3 C), Min:98.4 F (36.9 C), Max:99.8 F (37.7 C)   Recent Labs Lab 12/17/15 1134 12/17/15 1207 12/17/15 1615 12/19/15 0523 12/21/15 1638 12/21/15 1803  WBC  --  13.6*  --  17.3* 28.4*  --   CREATININE 2.11*  --   --  2.00* 2.52*  --   LATICACIDVEN 1.0  --  3.0*  --   --  1.1    Estimated Creatinine Clearance: 39 mL/min (by C-G formula based on SCr of 2.52 mg/dL (H)).    No Known Allergies  Antimicrobials this admission: vancomycin 10/10 >>  zosyn 10/10 >>   Dose adjustments this admission:   Microbiology results: 10/10 BCx:   10/8 MRSA PCR: neg  Thank you for allowing pharmacy to be a part of this patient's care.  Olene FlossMelissa D Arryn Terrones 12/21/2015 11:25 PM

## 2015-12-21 NOTE — ED Provider Notes (Addendum)
[facility] Emergency Department Provider Note   ____________________________________________   First MD Initiated Contact with Patient 12/21/15 1628     (approximate)  I have reviewed the triage vital signs and the nursing notes.   HISTORY  Chief Complaint Hyperventilating  Caveat-history of present illness and review of systems is limited due to the patient's severe respiratory distress.  HPI Walter Wong is a 30 y.o. male with with  medical history of schizoaffective disorder, developmental delay,bipolar disorder, chronic kidney disease stage III, diabetes, severe chronic interstitial lung disease not a candidate for transplant secondary to his psychiatric illness, PICC line in place in the right arm who presents from his SNF for shortness of breath noted today. Patient was discharged from [facility] yesterday after treatment for acute on chronic respiratory failure secondary to chronic interstitial lung disease as well as mild pneumonia, he was discharged with Levaquin and steroids.   Past Medical History:  Diagnosis Date  . ADHD (attention deficit hyperactivity disorder)   . Bipolar disorder (HCC)   . CHF (congestive heart failure) (HCC)    Diastolic CHF  . Chronic kidney disease    Stage 3  . Diabetes mellitus, type II (HCC)   . Interstitial lung disease (HCC)    on nocturnal home o2  . Schizophrenia [facility])     Patient Active Problem List   Diagnosis Date Noted  . ILD (interstitial lung disease) (HCC) 12/17/2015  . Acute on chronic respiratory failure with hypoxia (HCC) 10/22/2015  . Stricture and stenosis of esophagus   . Abnormal findings-gastrointestinal tract   . CHF exacerbation (HCC) 10/09/2015  . Schizoaffective disorder (HCC) 08/19/2015  . Sepsis (HCC) 08/17/2015  . Acute on chronic diastolic heart failure (HCC) 02/19/2015  . CHF (congestive heart failure) (HCC) 01/22/2015  . Heart failure (HCC) 01/22/2015    . Bipolar 1 disorder, mixed, moderate (HCC) 11/12/2014  . Bipolar disorder, current episode mixed, moderate (HCC) 11/12/2014    Past Surgical History:  Procedure Laterality Date  . DENTAL SURGERY    . ESOPHAGOGASTRODUODENOSCOPY (EGD) WITH PROPOFOL N/A 10/12/2015   Procedure: ESOPHAGOGASTRODUODENOSCOPY (EGD) WITH PROPOFOL;  Surgeon: Midge Minium, MD;  Location: [facility] ENDOSCOPY;  Service: Endoscopy;  Laterality: N/A;    Prior to Admission medications   Medication Sig Start Date End Date Taking? Authorizing Provider  acetaminophen (TYLENOL) 325 MG tablet Take 650 mg by mouth every 6 (six) hours as needed.   Yes Historical Provider, MD  carbamazepine (TEGRETOL) 200 MG tablet Take 1 tablet (200 mg total) by mouth 2 (two) times daily. 05/13/15  Yes Audery Amel, MD  levofloxacin (LEVAQUIN) 500 MG tablet Take 1 tablet (500 mg total) by mouth daily. 12/20/15 12/23/15 Yes Houston Siren, MD  pantoprazole (PROTONIX) 40 MG tablet Take 1 tablet (40 mg total) by mouth 2 (two) times daily. 10/13/15  Yes Enid Baas, MD  QUEtiapine (SEROQUEL) 100 MG tablet Take 1 tablet (100 mg total) by mouth daily. Patient taking differently: Take 100 mg by mouth every morning.  05/13/15  Yes Audery Amel, MD  QUEtiapine (SEROQUEL) 300 MG tablet Take 2 tablets (600 mg total) by mouth at bedtime. Patient taking differently: Take 300 mg by mouth at bedtime.  05/13/15  Yes Audery Amel, MD  thiothixene (NAVANE) 10 MG capsule Take 1 capsule (10 mg total) by mouth 2 (two) times daily. 05/13/15  Yes Audery Amel, MD  tiotropium (SPIRIVA HANDIHALER) 18 MCG inhalation capsule Place 1 capsule (18 mcg  total) into inhaler and inhale daily. 08/30/15 08/29/16 Yes Shane Crutch, MD  torsemide (DEMADEX) 10 MG tablet Take 10 mg by mouth every morning.    Yes Historical Provider, MD  traMADol (ULTRAM) 50 MG tablet Take 50 mg by mouth every 6 (six) hours as needed for moderate pain.    Yes Historical Provider, MD     Allergies Review of patient's allergies indicates no known allergies.  Family History  Problem Relation Age of Onset  . Kidney failure Mother   . Congestive Heart Failure Mother     Social History Social History  Substance Use Topics  . Smoking status: Never Smoker  . Smokeless tobacco: Never Used  . Alcohol use No    Review of Systems  Caveat-history of present illness and review of systems is limited due to the patient's severe respiratory distress. ____________________________________________   PHYSICAL EXAM:  Vitals:   12/21/15 1700 12/21/15 1730 12/21/15 1800 12/21/15 1830  BP: (!) 97/54 133/88 112/89 (!) 114/94  Pulse: (!) 136 (!) 135 (!) 127 (!) 124  Resp: (!) 25 17  16   Temp:      TempSrc:      SpO2: 100% 100% 99% 100%  Weight:      Height:        VITAL SIGNS: ED Triage Vitals  Enc Vitals Group     BP      Pulse      Resp      Temp      Temp src      SpO2      Weight      Height      Head Circumference      Peak Flow      Pain Score      Pain Loc      Pain Edu?      Excl. in GC?     Constitutional: Alert and oriented. In moderate to severe respiratory distress, able to speak in short phrases only. Eyes: Conjunctivae are normal. PERRL. EOMI. Head: Atraumatic. Nose: No congestion/rhinnorhea. Mouth/Throat: Mucous membranes are moist.  Oropharynx non-erythematous. Neck: No stridor. Supple without meningismus. Cardiovascular: Tachycardic rate, regular rhythm. Grossly normal heart sounds.  Good peripheral circulation. Respiratory: Tachypnea with increased work of breathing, Globally diminished breath sounds. Gastrointestinal: Soft and nontender. No distention.  No CVA tenderness. Genitourinary: deferred Musculoskeletal: No lower extremity tenderness nor edema.  No joint effusions. Neurologic:  Normal speech and language. No gross focal neurologic deficits are appreciated. Skin:  Skin is warm, dry and intact. No rash noted. Psychiatric: Mood  and affect are normal. Speech and behavior are normal.  ____________________________________________   LABS (all labs ordered are listed, but only abnormal results are displayed)  Labs Reviewed  CBC WITH DIFFERENTIAL/PLATELET - Abnormal; Notable for the following:       Result Value   WBC 28.4 (*)    Hemoglobin 10.8 (*)    HCT 34.7 (*)    MCV 70.1 (*)    MCH 21.8 (*)    MCHC 31.0 (*)    RDW 14.8 (*)    Platelets 473 (*)    Neutro Abs 18.1 (*)    Lymphs Abs 6.0 (*)    Monocytes Absolute 3.7 (*)    All other components within normal limits  COMPREHENSIVE METABOLIC PANEL - Abnormal; Notable for the following:    Chloride 95 (*)    Glucose, Bld 130 (*)    BUN 40 (*)    Creatinine, Ser 2.52 (*)  Total Bilirubin <0.1 (*)    GFR calc non Af Amer 33 (*)    GFR calc Af Amer 38 (*)    All other components within normal limits  TROPONIN I - Abnormal; Notable for the following:    Troponin I 0.03 (*)    All other components within normal limits  CULTURE, BLOOD (ROUTINE X 2)  CULTURE, BLOOD (ROUTINE X 2)  LACTIC ACID, PLASMA  LACTIC ACID, PLASMA   ____________________________________________  EKG  ED ECG REPORT I, Gayla DossGayle, Brieann Osinski A, the attending physician, personally viewed and interpreted this ECG.   Date: 12/21/2015  EKG Time: 16:37  Rate: 157  Rhythm: sinus tachycardia  Axis: borderline right axis  Intervals:none  ST&T Change: No acute ST elevation or acute ST depression.  ____________________________________________  RADIOLOGY  CXR IMPRESSION:  Background pattern of pulmonary fibrosis. Question slight  accentuation of markings at the lung bases raising the possibility  of mild pneumonia.      ____________________________________________   PROCEDURES  Procedure(s) performed: None  Procedures  Critical Care performed: Yes, see critical care note(s)   CRITICAL CARE Performed by: Toney RakesGayle, Lacinda Curvin A   Total critical care time: 35 minutes  Critical  care time was exclusive of separately billable procedures and treating other patients.  Critical care was necessary to treat or prevent imminent or life-threatening deterioration.  Critical care was time spent personally by me on the following activities: development of treatment plan with patient and/or surrogate as well as nursing, discussions with consultants, evaluation of patient's response to treatment, examination of patient, obtaining history from patient or surrogate, ordering and performing treatments and interventions, ordering and review of laboratory studies, ordering and review of radiographic studies, pulse oximetry and re-evaluation of patient's condition.  ____________________________________________   INITIAL IMPRESSION / ASSESSMENT AND PLAN / ED COURSE  Pertinent labs & imaging results that were available during my care of the patient were reviewed by me and considered in my medical decision making (see chart for details).  Milinda AntisJason A Brazzel is a 30 y.o. male with with  medical history of schizoaffective disorder, developmental delay,bipolar disorder, chronic kidney disease stage III, diabetes, severe chronic interstitial lung disease not a candidate for transplant secondary to his psychiatric illness present for evaluation of severe respiratory distress. On arrival to the emergency department he is in severe distress. His respiratory rate is in the 40s, oxygen saturation is 53% on nasal cannula, heart rate of 155, maintaining adequate blood pressure. We'll initiate BiPAP. We'll give IV steroids, hold on any albuterol nebs given his tachycardia which I do not want to worsen. We'll obtain screening labs, chest x-ray and anticipate readmission.  ----------------------------------------- 6:04 PM on 12/21/2015 -----------------------------------------  Patient appears much more comfortable on BiPAP. He is sitting up in bed, smiling able to speak over the BiPAP. Heart rate has improved  to 129 bpm, blood pressure stable. O2 saturation 100% on BiPAP. His white blood cell count is elevated at 28,000, blood cultures and lactic acid sent, he is receiving IV fluids. CMP shows mild creatinine elevation at 2.52. Chest x-ray with changes of fibrosis as well as possibly an underlying pneumonia. I ordered vancomycin, Zosyn and Levaquin for possible HCAP. Discussed the case with Dr. Delton CoombesByrum, intensivist, for admission and the patient will be admitted to the ICU.  Clinical Course     ____________________________________________   FINAL CLINICAL IMPRESSION(S) / ED DIAGNOSES  Final diagnoses:  Healthcare-associated pneumonia  Interstitial fibrosis (HCC)  Acute on chronic respiratory failure with hypoxia (HCC)  NEW MEDICATIONS STARTED DURING THIS VISIT:  New Prescriptions   No medications on file     Note:  This document was prepared using Dragon voice recognition software and may include unintentional dictation errors.    Gayla Doss, MD 12/21/15 1610    Gayla Doss, MD 12/21/15 9604    Gayla Doss, MD 12/21/15 [phone]

## 2015-12-21 NOTE — H&P (Signed)
PULMONARY / CRITICAL CARE MEDICINE   Name: Walter Wong MRN: [id number] DOB: 1985-05-18    ADMISSION DATE:  12/21/2015 CONSULTATION DATE:  12/21/15  REFERRING MD:  Dr. Inocencio Homes  CHIEF COMPLAINT:  Shortness of breath  HISTORY OF PRESENT ILLNESS:   Pedram Goodchild is a 30 year old male with past medical history significant for ADHD, bipolar disorder, congestive heart failure, chronic kidney disease, diabetes mellitus type 2, interstitial lung disease and schizophrenia. Patient was recently presented to [facility] on 10/6 with acute respiratory failure secondary to interstitial lung disease  Vs HCAP.  Patient was  Discharged to his rehabilitation facility with Levaquin and steroids. Patient presents today on 10/10 with acute shortness of breath requiring BiPAP.  Patient states that he was having fever and not feeling good with breathing.Patient denies any nausea, vomiting, diarrhea.  CCM team was called to admit the patient.  PAST MEDICAL HISTORY :  He  has a past medical history of ADHD (attention deficit hyperactivity disorder); Bipolar disorder (HCC); CHF (congestive heart failure) (HCC); Chronic kidney disease; Diabetes mellitus, type II (HCC); Interstitial lung disease (HCC); and Schizophrenia (HCC).  PAST SURGICAL HISTORY: He  has a past surgical history that includes Dental surgery and Esophagogastroduodenoscopy (egd) with propofol (N/A, 10/12/2015).  No Known Allergies  No current facility-administered medications on file prior to encounter.    Current Outpatient Prescriptions on File Prior to Encounter  Medication Sig  . acetaminophen (TYLENOL) 325 MG tablet Take 650 mg by mouth every 6 (six) hours as needed.  . carbamazepine (TEGRETOL) 200 MG tablet Take 1 tablet (200 mg total) by mouth 2 (two) times daily.  Marland Kitchen levofloxacin (LEVAQUIN) 500 MG tablet Take 1 tablet (500 mg total) by mouth daily.  . pantoprazole (PROTONIX) 40 MG tablet Take 1 tablet (40 mg total) by  mouth 2 (two) times daily.  . QUEtiapine (SEROQUEL) 100 MG tablet Take 1 tablet (100 mg total) by mouth daily. (Patient taking differently: Take 100 mg by mouth every morning. )  . QUEtiapine (SEROQUEL) 300 MG tablet Take 2 tablets (600 mg total) by mouth at bedtime. (Patient taking differently: Take 300 mg by mouth at bedtime. )  . thiothixene (NAVANE) 10 MG capsule Take 1 capsule (10 mg total) by mouth 2 (two) times daily.  Marland Kitchen tiotropium (SPIRIVA HANDIHALER) 18 MCG inhalation capsule Place 1 capsule (18 mcg total) into inhaler and inhale daily.  Marland Kitchen torsemide (DEMADEX) 10 MG tablet Take 10 mg by mouth every morning.   . traMADol (ULTRAM) 50 MG tablet Take 50 mg by mouth every 6 (six) hours as needed for moderate pain.     FAMILY HISTORY:  His indicated that his mother is alive. He indicated that his father is alive. He indicated that his brother is alive.    SOCIAL HISTORY: He  reports that he has never smoked. He has never used smokeless tobacco. He reports that he does not drink alcohol or use drugs.  REVIEW OF SYSTEMS:   Review of Systems  Constitutional: Positive for fever. Negative for chills, diaphoresis, malaise/fatigue and weight loss.  Respiratory: Positive for cough, sputum production and shortness of breath. Negative for wheezing.   Gastrointestinal: Negative for abdominal pain and constipation.  Musculoskeletal: Negative for back pain, joint pain and neck pain.  Neurological: Negative for tremors, sensory change, speech change and focal weakness.  Endo/Heme/Allergies: Negative for environmental allergies. Does not bruise/bleed easily.  Psychiatric/Behavioral: Negative for hallucinations, substance abuse and suicidal ideas. The patient is not nervous/anxious.  SUBJECTIVE:  Patient states that he had fever and was not feeling good. He also states that he is very short of breath and feels better on BiPAP.  VITAL SIGNS: BP (!) 114/94   Pulse (!) 124   Temp 98.4 F (36.9  C) (Oral)   Resp 16   Ht  (1.676 m)   Wt 90.7 kg (200 lb)   SpO2 100%   BMI 32.28 kg/m   HEMODYNAMICS:    VENTILATOR SETTINGS: FiO2 (%):  [40 %] 40 %  INTAKE / OUTPUT: No intake/output data recorded.  PHYSICAL EXAMINATION: General: young African-American male found on BiPAP Neuro:  Alert, oriented 2 follows commands HEENT:  Neck supple, PERRLA, no JVD appreciated Cardiovascular: tachycardic, regular rate, no MRG noted Lungs:  Coarse, no wheezes, crackles, rhonchi noted Abdomen: soft, nontender,+BS Musculoskeletal:  No nflammation/deformity noted Skin:  Grossly intact LABS:  BMET  Recent Labs Lab 12/17/15 1134 12/19/15 0523 12/21/15 1638  NA 136 136 137  K 3.9 4.8 3.9  CL 96* 98* 95*  CO2 BUN 40* 38* 40*  CREATININE 2.11* 2.00* 2.52*  GLUCOSE 106* 103* 130*    Electrolytes  Recent Labs Lab 12/17/15 1134 12/19/15 0523 12/21/15 1638  CALCIUM 9.0 9.2 9.4    CBC  Recent Labs Lab 12/17/15 1207 12/19/15 0523 12/21/15 1638  WBC 13.6* 17.3* 28.4*  HGB 10.6* 8.8* 10.8*  HCT 34.3* 28.2* 34.7*  PLT 369 328 473*    Coag's No results for input(s): APTT, INR in the last 168 hours.  Sepsis Markers  Recent Labs Lab 12/17/15 1134 12/17/15 1615 12/21/15 1803  LATICACIDVEN 1.0 3.0* 1.1    ABG No results for input(s): PHART, PCO2ART, PO2ART in the last 168 hours.  Liver Enzymes  Recent Labs Lab 12/17/15 1134 12/21/15 1638  AST 15 23  ALT 11* 20  ALKPHOS 54 58  BILITOT 0.3 <0.1*  ALBUMIN 3.2* 3.5    Cardiac Enzymes  Recent Labs Lab 12/17/15 1134 12/21/15 1638  TROPONINI 0.05* 0.03*    Glucose  Recent Labs Lab 12/18/15 2138 12/19/15 0737 12/19/15 1124 12/19/15 1639 12/19/15 2103 12/20/15 0741  GLUCAP 120* 97 100* 142* 103* 93    Imaging Dg Chest Portable 1 View  Result Date: 12/21/2015 CLINICAL DATA:  Chronic hypoxic respiratory failure secondary to interstitial lung disease. EXAM: PORTABLE CHEST 1  VIEW COMPARISON:  12/18/2015 FINDINGS: Heart size is normal. Mediastinal shadows are normal. Diffuse pulmonary fibrosis again demonstrated. One could question slight increase in prominence of the markings in the lower lungs, particularly on the right, raising the possibility of mild pneumonia. No dense consolidation or lobar collapse. IMPRESSION: Background pattern of pulmonary fibrosis. Question slight accentuation of markings at the lung bases raising the possibility of mild pneumonia. Electronically Signed   By: Paulina Fusi M.D.   On: 12/21/2015 16:57     STUDIES:  none  CULTURES: 10/10 [place]>> 10/10 BC>>  ANTIBIOTICS: 10/10 vancomycin>> 10/10 zosyn>>  SIGNIFICANT EVENTS: 10/10 >> Patient was admitted to the intensive care unit on BiPAP with shortness of breath related to interstitial lung disease versus HCAP.  LINES/TUBES: none  DISCUSSION: 30 yo male presente to [facility] ER on 10/10 with acute on chronic hypoxic respiratory failure secondary to interstitial lung disease versus HCAP.  ASSESSMENT / PLAN:  PULMONARY A: Acute on chronic hypoxic respiratory failure likely secondary to interstitial lung disease versus Vs HCAP P:   Continue BiPAP, wean as tolerated Continue vancomycin/zosyn Follow cultures Bronchodilators prn Will continue  to treat ILD conservatively   CARDIOVASCULAR A:  Hx of CHF P:  Continuous telemetry  RENAL A:   Acute on chronic Kidney disease P:   Monitor renal function Avoid Nephro toxic drugs Monitor I/O Replace electrolytes per ICU protocol   GASTROINTESTINAL A:   No active issues P:   Carb modified, renal diet Advance diet as tolerated  HEMATOLOGIC A:   no active issues P:  Heparin for DVT prophylaxis  INFECTIOUS A:   ? HCAP Leukocytosis  P:   Monitor fever curve Continue antibiotics as above Follow cultures  ENDOCRINE A:   Diabetes Melitus P:   Blood sugar checks achs SSI coverage  NEUROLOGIC A:   Hx of ADHD Hx  of Bipolar disorder Hx of Schizoaffective disorder  P:   Continue carbamazepine/tegretol Continue Thiothixene   FAMILY  - Updates: No family present at bedside   Spoke to Dr. Sheryle Hail this morning to transfer care.   Bincy Varughese,AG-ACNP Pulmonary and Critical Care Medicine [facility]   12/21/2015, 7:56 PM  STAFF NOTE: I, Dr. Stephanie Acre have personally reviewed patient's available data, including medical history, events of note, physical examination and test results as part of my evaluation. I have discussed with NP Varughese  and other care providers such as pharmacist, RN and RRT.     Acute on chronic hypoxic respiratory failure likely secondary to interstitial lung disease vs. ?HCAP Hx: Diastolic congestive heart failure Continue abx for a total of 7 days - he was just treated for HCAP and dc'ed home on levaquin.  This episode is more of an ILD flare and will probably need a prolonged steroid taper.  Difficult to manage ILD. Does not need Bipap at night.  Follow cultures Steroids- Prednisone 40mg , wean by 5mg  q3 days down to 20mg  daily.  Pulmonary will continue to wean his steroids as an outpatient Continue  bronchodilators Supplemental O2 as needed  Maintain O2 sats 88% or above or for dyspnea Prn Morphine for dyspnea Will continue to treat interstitial lung disease conservatively - pending he is steroid responsive.  Developmental disability/autism complicates therapy given pts inability to understand many therapies and interventions   . Rest per NP/medical resident whose note is outlined above and that I agree with  Pulmonary Care Time devoted to patient care services described in this note is  45 Minutes.   This time reflects time of care of this signee Dr Stephanie Acre.  This critical care time does not reflect procedure time, or teaching time or supervisory time of PA/NP/Med-student/Med Resident etc but could involve care discussion time.  Stephanie Acre, MD [facility] Pager [phone] (please enter 7-digits) On Call Pager - [phone] (please enter 7-digits)  Note: This note was prepared with Dragon dictation along with smaller phrase technology. Any transcriptional errors that result from this process are unintentional.

## 2015-12-21 NOTE — Progress Notes (Signed)
Chaplain received order to visit Pt in [facility]. Pt told Ch that he was suffering from acute respiratory condition and had some signs of pneumonia, and asked Ch to pray for him. Ch prayed for Pt and promise to check on him in the morning.     12/21/15 2100  Clinical Encounter Type  Visited With Patient  Visit Type Initial;Spiritual support  Referral From Nurse  Consult/Referral To Chaplain  Spiritual Encounters  Spiritual Needs Prayer  Stress Factors  Patient Stress Factors None identified  Advance Directives (For Healthcare)  Does patient have an advance directive? No  Would patient like information on creating an advanced directive? No - patient declined information

## 2015-12-22 DIAGNOSIS — IMO0001 Reserved for inherently not codable concepts without codable children: Secondary | ICD-10-CM

## 2015-12-22 DIAGNOSIS — J8410 Pulmonary fibrosis, unspecified: Secondary | ICD-10-CM

## 2015-12-22 DIAGNOSIS — J9621 Acute and chronic respiratory failure with hypoxia: Secondary | ICD-10-CM

## 2015-12-22 DIAGNOSIS — J189 Pneumonia, unspecified organism: Secondary | ICD-10-CM

## 2015-12-22 LAB — GLUCOSE, CAPILLARY
GLUCOSE-CAPILLARY: 107 mg/dL — AB (ref 65–99)
Glucose-Capillary: 90 mg/dL (ref 65–99)
Glucose-Capillary: 102 mg/dL — ABNORMAL HIGH (ref 65–99)
Glucose-Capillary: 111 mg/dL — ABNORMAL HIGH (ref 65–99)
Glucose-Capillary: 130 mg/dL — ABNORMAL HIGH (ref 65–99)

## 2015-12-22 LAB — CBC
HEMATOCRIT: 29.5 % — AB (ref 40.0–52.0)
HEMOGLOBIN: 9.2 g/dL — AB (ref 13.0–18.0)
MCH: 22.2 pg — ABNORMAL LOW (ref 26.0–34.0)
MCHC: 31.1 g/dL — ABNORMAL LOW (ref 32.0–36.0)
MCV: 71.2 fL — AB (ref 80.0–100.0)
Platelets: 332 10*3/uL (ref 150–440)
RBC: 4.15 MIL/uL — ABNORMAL LOW (ref 4.40–5.90)
RDW: 14.6 % — ABNORMAL HIGH (ref 11.5–14.5)
WBC: 15.9 10*3/uL — AB (ref 3.8–10.6)

## 2015-12-22 LAB — BASIC METABOLIC PANEL
ANION GAP: 8 (ref 5–15)
BUN: 43 mg/dL — ABNORMAL HIGH (ref 6–20)
CHLORIDE: 100 mmol/L — AB (ref 101–111)
CO2: 30 mmol/L (ref 22–32)
Calcium: 8.8 mg/dL — ABNORMAL LOW (ref 8.9–10.3)
Creatinine, Ser: 2.36 mg/dL — ABNORMAL HIGH (ref 0.61–1.24)
GFR calc non Af Amer: 36 mL/min — ABNORMAL LOW (ref >60)
GFR, EST AFRICAN AMERICAN: 41 mL/min — AB (ref >60)
GLUCOSE: 102 mg/dL — AB (ref 65–99)
POTASSIUM: 3.9 mmol/L (ref 3.5–5.1)
Sodium: 138 mmol/L (ref 135–145)

## 2015-12-22 LAB — CULTURE, BLOOD (ROUTINE X 2)
CULTURE: NO GROWTH
CULTURE: NO GROWTH

## 2015-12-22 LAB — MAGNESIUM: Magnesium: 1.5 mg/dL — ABNORMAL LOW (ref 1.7–2.4)

## 2015-12-22 LAB — PHOSPHORUS: PHOSPHORUS: 4.5 mg/dL (ref 2.5–4.6)

## 2015-12-22 LAB — PROCALCITONIN: PROCALCITONIN: 0.27 ng/mL

## 2015-12-22 MED ORDER — QUETIAPINE FUMARATE 200 MG PO TABS
300.0000 mg | ORAL_TABLET | Freq: Every day | ORAL | Status: DC
  Administered 2015-12-22: 300 mg via ORAL
  Filled 2015-12-22: qty 4

## 2015-12-22 MED ORDER — TIOTROPIUM BROMIDE MONOHYDRATE 18 MCG IN CAPS: 18.0000 ug | ORAL_CAPSULE | Freq: Every day | RESPIRATORY_TRACT | Status: DC

## 2015-12-22 MED ORDER — QUETIAPINE FUMARATE 200 MG PO TABS: 300.0000 mg | ORAL_TABLET | Freq: Every day | ORAL | Status: DC

## 2015-12-22 MED ORDER — LORAZEPAM 2 MG/ML IJ SOLN
1.0000 mg | Freq: Once | INTRAMUSCULAR | Status: AC
  Administered 2015-12-22: 1 mg via INTRAVENOUS
  Filled 2015-12-22: qty 1

## 2015-12-22 MED ORDER — CARBAMAZEPINE 200 MG PO TABS: 200.0000 mg | ORAL_TABLET | Freq: Two times a day (BID) | ORAL | Status: DC

## 2015-12-22 MED ORDER — THIOTHIXENE 10 MG PO CAPS
10.0000 mg | ORAL_CAPSULE | Freq: Two times a day (BID) | ORAL | Status: DC
Start: 2015-12-22 — End: 2015-12-22

## 2015-12-22 MED ORDER — ENOXAPARIN SODIUM 40 MG/0.4ML ~~LOC~~ SOLN
40.0000 mg | SUBCUTANEOUS | Status: DC
  Filled 2015-12-22: qty 0.4

## 2015-12-22 MED ORDER — PREDNISONE 20 MG PO TABS
20.0000 mg | ORAL_TABLET | Freq: Every day | ORAL | Status: DC
  Administered 2015-12-22 – 2015-12-23 (×2): 20 mg via ORAL
  Filled 2015-12-22 (×2): qty 1

## 2015-12-22 MED ORDER — QUETIAPINE FUMARATE 25 MG PO TABS: 100.0000 mg | ORAL_TABLET | ORAL | Status: DC

## 2015-12-22 MED ORDER — TRAMADOL HCL 50 MG PO TABS: 50.0000 mg | ORAL_TABLET | Freq: Four times a day (QID) | ORAL | Status: DC | PRN

## 2015-12-22 MED ORDER — MAGNESIUM SULFATE 2 GM/50ML IV SOLN
2.0000 g | Freq: Once | INTRAVENOUS | Status: AC
  Administered 2015-12-22: 2 g via INTRAVENOUS
  Filled 2015-12-22 (×2): qty 50

## 2015-12-22 MED ORDER — PANTOPRAZOLE SODIUM 40 MG PO TBEC
40.0000 mg | DELAYED_RELEASE_TABLET | Freq: Every day | ORAL | Status: DC
  Administered 2015-12-22 – 2015-12-23 (×2): 40 mg via ORAL
  Filled 2015-12-22 (×2): qty 1

## 2015-12-22 MED ORDER — PANTOPRAZOLE SODIUM 40 MG PO TBEC: 40.0000 mg | DELAYED_RELEASE_TABLET | Freq: Two times a day (BID) | ORAL | Status: DC

## 2015-12-22 MED ORDER — LORAZEPAM 2 MG/ML IJ SOLN
0.5000 mg | INTRAMUSCULAR | Status: DC | PRN
  Administered 2015-12-22 – 2015-12-23 (×4): 0.5 mg via INTRAVENOUS
  Filled 2015-12-22 (×4): qty 1

## 2015-12-22 MED ORDER — TORSEMIDE 20 MG PO TABS
10.0000 mg | ORAL_TABLET | ORAL | Status: DC
  Administered 2015-12-23: 10 mg via ORAL
  Filled 2015-12-22: qty 1

## 2015-12-22 NOTE — Progress Notes (Signed)
Bi-pap removed approximately 0813. SpO2 100% on 4LNC. RR even and labored in 30's at baseline. A & O x 4 with no c/o pain. VS stable. NSR on cardiac monitor. Pt with orders to transfer to floor.

## 2015-12-22 NOTE — [place] FL2 (Signed)
[place] MEDICAID FL2 LEVEL OF CARE SCREENING TOOL     IDENTIFICATION  Patient Name: Walter Wong Birthdate: 1985/09/21 Sex: male Admission Date (Current Location): 12/21/2015  [place] and [place] Number:  Chiropodist and Address:  [facility], [address], [place], [place] [address]      Provider Number: [id number]  Attending Physician Name and Address:  Wyatt Haste, MD  Relative Name and Phone Number:       Current Level of Care: Hospital Recommended Level of Care: Skilled Nursing Facility Prior Approval Number:    Date Approved/Denied:   PASRR Number: [id number]  Discharge Plan: SNF    Current Diagnoses: Patient Active Problem List   Diagnosis Date Noted  . Healthcare-associated pneumonia   . Interstitial fibrosis (HCC)   . Acute respiratory failure (HCC) 12/21/2015  . ILD (interstitial lung disease) (HCC) 12/17/2015  . Acute on chronic respiratory failure with hypoxia (HCC) 10/22/2015  . Stricture and stenosis of esophagus   . Abnormal findings-gastrointestinal tract   . CHF exacerbation (HCC) 10/09/2015  . Schizoaffective disorder (HCC) 08/19/2015  . Sepsis (HCC) 08/17/2015  . Acute on chronic diastolic heart failure (HCC) 02/19/2015  . CHF (congestive heart failure) (HCC) 01/22/2015  . Heart failure (HCC) 01/22/2015  . Bipolar 1 disorder, mixed, moderate (HCC) 11/12/2014  . Bipolar disorder, current episode mixed, moderate (HCC) 11/12/2014    Orientation RESPIRATION BLADDER Height & Weight     Self, Place  Normal, O2 (4 liters) Continent Weight: 202 lb 3.2 oz (91.7 kg) Height:  5\' 6"  (167.6 cm)  BEHAVIORAL SYMPTOMS/MOOD NEUROLOGICAL BOWEL NUTRITION STATUS   (none)  (none) Continent Diet (renal/carb modified)  AMBULATORY STATUS COMMUNICATION OF NEEDS Skin   Extensive Assist Verbally Normal                       Personal Care Assistance Level of Assistance  Bathing, Dressing Bathing Assistance: Limited  assistance   Dressing Assistance: Limited assistance     Functional Limitations Info             SPECIAL CARE FACTORS FREQUENCY                       Contractures Contractures Info: Not present    Additional Factors Info  Code Status, Allergies Code Status Info: full Allergies Info: nka           Current Medications (12/22/2015):  This is the current hospital active medication list Current Facility-Administered Medications  Medication Dose Route Frequency Provider Last Rate Last Dose  . 0.9 %  sodium chloride infusion  250 mL Intravenous PRN Bincy S Varughese, NP      . acetaminophen (TYLENOL) tablet 650 mg  650 mg Oral Q4H PRN Bincy S Varughese, NP      . carbamazepine (TEGRETOL) tablet 200 mg  200 mg Oral BID Bincy S Varughese, NP   200 mg at 12/22/15 0926  . chlorhexidine (PERIDEX) 0.12 % solution 15 mL  15 mL Mouth Rinse BID Bincy S Varughese, NP   15 mL at 12/22/15 0926  . enoxaparin (LOVENOX) injection 40 mg  40 mg Subcutaneous Q24H Wyatt Haste, MD      . ipratropium-albuterol (DUONEB) 0.5-2.5 (3) MG/3ML nebulizer solution 3 mL  3 mL Nebulization Q4H PRN Bincy S Varughese, NP      . LORazepam (ATIVAN) injection 0.5 mg  0.5 mg Intravenous Q4H PRN Wyatt Haste, MD  0.5 mg at 12/22/15 1322  . magnesium sulfate IVPB 2 g 50 mL  2 g Intravenous Once Wyatt Hasteavid K Hower, MD      . MEDLINE mouth rinse  15 mL Mouth Rinse q12n4p Bincy S Varughese, NP      . pantoprazole (PROTONIX) EC tablet 40 mg  40 mg Oral Q1200 Bincy S Varughese, NP      . piperacillin-tazobactam (ZOSYN) IVPB 3.375 g  3.375 g Intravenous Q8H Bincy S Varughese, NP   3.375 g at 12/22/15 0233  . predniSONE (DELTASONE) tablet 20 mg  20 mg Oral Q breakfast Bincy S Varughese, NP   20 mg at 12/22/15 0813  . QUEtiapine (SEROQUEL XR) 24 hr tablet 100 mg  100 mg Oral Daily Bincy S Varughese, NP   100 mg at 12/22/15 0926  . thiothixene (NAVANE) capsule 10 mg  10 mg Oral BID Gwendolyn FillBincy S Varughese, NP   10 mg at 12/22/15  0926  . tiotropium (SPIRIVA) inhalation capsule 18 mcg  18 mcg Inhalation Daily Gwendolyn FillBincy S Varughese, NP   18 mcg at 12/22/15 0813  . [START ON 12/23/2015] torsemide (DEMADEX) tablet 10 mg  10 mg Oral BH-q7a Wyatt Hasteavid K Hower, MD      . traMADol Janean Sark(ULTRAM) tablet 50 mg  50 mg Oral Q6H PRN Wyatt Hasteavid K Hower, MD         Discharge Medications: Please see discharge summary for a list of discharge medications.  Relevant Imaging Results:  Relevant Lab Results:   Additional Information    York SpanielMonica Argie Lober, LCSW

## 2015-12-22 NOTE — Progress Notes (Signed)
CSW is aware that patient is from [facility]. CSW will complete assessment tomorrow 12/22/15 due to patient settling in in room.  Woodroe Modehristina Khrystina Bonnes, MSW, LCSW, LCAS-A Clinical Social Worker [phone]

## 2015-12-22 NOTE — Care Management (Signed)
[facility] [phone]. Marland Kitchen. Patient's mother Walter DecemberSharon [phone]; Walter SchwalbeBrother  Walter Wong [phone]. [facility] and he does not have a HCPOA or guardian listed at their facility. Responsible person is his mother and brother Jill AlexandersJustin. Patient transferring to room 101 on oncology/1C unit. I spoke with Ms. Dianne DunSharon Pasquarelli and [facility]. She has been updated on plan to transfer to room 101.

## 2015-12-22 NOTE — Progress Notes (Signed)
[facility], on exertion with 4L patient saturations dropped to 50's. Patient placed on bipap and sats recovered to 90's. Patient remained alert and oriented. Bincy NP notified. Vital signs currently stable. Trudee KusterBrandi R [place]

## 2015-12-22 NOTE — Progress Notes (Signed)
[facility] [place] [facility]   PATIENT NAME: Walter Wong    MRN#:  [id number]  DATE OF BIRTH:  01/18/1986  SUBJECTIVE:  Hospital Day: 1 day Walter Wong is a 30 y.o. male presenting with Shortness of breath.   Overnight events: Was in ICU overnight and transferred this morning Interval Events: No complaint states breathing feels better  REVIEW OF SYSTEMS:  CONSTITUTIONAL: No fever, fatigue or weakness.  EYES: No blurred or double vision.  EARS, NOSE, AND THROAT: No tinnitus or ear pain.  RESPIRATORY: No cough, Positive shortness of breath denies, wheezing or hemoptysis.  CARDIOVASCULAR: No chest pain, orthopnea, edema.  GASTROINTESTINAL: No nausea, vomiting, diarrhea or abdominal pain.  GENITOURINARY: No dysuria, hematuria.  ENDOCRINE: No polyuria, nocturia,  HEMATOLOGY: No anemia, easy bruising or bleeding SKIN: No rash or lesion. MUSCULOSKELETAL: No joint pain or arthritis.   NEUROLOGIC: No tingling, numbness, weakness.  PSYCHIATRY: Positive anxiety denies depression.   DRUG ALLERGIES:  No Known Allergies  VITALS:  Blood pressure 119/80, pulse 85, temperature 98 F (36.7 C), temperature source Oral, resp. rate (!) 22, height 5\' 6"  (1.676 m), weight 91.7 kg (202 lb 3.2 oz), SpO2 99 %.  PHYSICAL EXAMINATION:  VITAL SIGNS: Vitals:   12/22/15 0800 12/22/15 1252  BP: 115/75 119/80  Pulse: 85 85  Resp:  (!) 22  Temp: 98.3 F (36.8 C) 98 F (36.7 C)   GENERAL:30 y.o.male currently in no acute distress.  HEAD: Normocephalic, atraumatic.  EYES: Pupils equal, round, reactive to light. Extraocular muscles intact. No scleral icterus.  MOUTH: Moist mucosal membrane. Dentition intact. No abscess noted.  EAR, NOSE, THROAT: Clear without exudates. No external lesions.  NECK: Supple. No thyromegaly. No nodules. No JVD.  PULMONARY: Diminished breath sounds with scant wheezing. No use of accessory muscles, Good respiratory effort. good air entry  bilaterally CHEST: Nontender to palpation.  CARDIOVASCULAR: S1 and S2. Regular rate and rhythm. No murmurs, rubs, or gallops. No edema. Pedal pulses 2+ bilaterally.  GASTROINTESTINAL: Soft, nontender, nondistended. No masses. Positive bowel sounds. No hepatosplenomegaly.  MUSCULOSKELETAL: No swelling, clubbing, or edema. Range of motion full in all extremities.  NEUROLOGIC: Cranial nerves II through XII are intact. No gross focal neurological deficits. Sensation intact. Reflexes intact.  SKIN: No ulceration, lesions, rashes, or cyanosis. Skin warm and dry. Turgor intact.  PSYCHIATRIC: Mood, affect within normal limits. The patient is awake, alert and oriented x 3. Insight, judgment intact.      LABORATORY PANEL:   CBC  Recent Labs Lab 12/22/15 0434  WBC 15.9*  HGB 9.2*  HCT 29.5*  PLT 332   ------------------------------------------------------------------------------------------------------------------  Chemistries   Recent Labs Lab 12/21/15 1638 12/22/15 0434  NA 137 138  K 3.9 3.9  CL 95* 100*  CO2 30 30  GLUCOSE 130* 102*  BUN 40* 43*  CREATININE 2.52* 2.36*  CALCIUM 9.4 8.8*  MG  --  1.5*  AST 23  --   ALT 20  --   ALKPHOS 58  --   BILITOT <0.1*  --    ------------------------------------------------------------------------------------------------------------------  Cardiac Enzymes  Recent Labs Lab 12/21/15 1638  TROPONINI 0.03*   ------------------------------------------------------------------------------------------------------------------  RADIOLOGY:  Dg Chest Portable 1 View  Result Date: 12/21/2015 CLINICAL DATA:  Chronic hypoxic respiratory failure secondary to interstitial lung disease. EXAM: PORTABLE CHEST 1 VIEW COMPARISON:  12/18/2015 FINDINGS: Heart size is normal. Mediastinal shadows are normal. Diffuse pulmonary fibrosis again demonstrated. One could question slight increase in prominence of the markings in  the lower lungs, particularly  on the right, raising the possibility of mild pneumonia. No dense consolidation or lobar collapse. IMPRESSION: Background pattern of pulmonary fibrosis. Question slight accentuation of markings at the lung bases raising the possibility of mild pneumonia. Electronically Signed   By: Paulina Fusi M.D.   On: 12/21/2015 16:57    EKG:   Orders placed or performed during the hospital encounter of 12/21/15  . ED EKG  . ED EKG    ASSESSMENT AND PLAN:   Walter Wong is a 30 y.o. male presenting with Shortness of breath. Admitted 12/21/2015 : Day #: 1 day 1. Acute on chronic respiratory failure with hypoxia: Multifactorial including baseline interstitial lung disease possible superimposed pneumonia, continue supplemental oxygen, breathing treatments, steroids MRSA PCR negative can discontinue vancomycin, check pro-calcitonin if within normal limits discontinue Zosyn.  2. Type 2 diabetes insulin sliding scale 3. Bipolar disorder not otherwise specified continue medications   All the records are reviewed and case discussed with Care Management/Social Workerr. Management plans discussed with the patient, family and they are in agreement.  CODE STATUS: full TOTAL TIME TAKING CARE OF THIS PATIENT: 33 minutes.   POSSIBLE D/C IN 1DAYS, DEPENDING ON CLINICAL CONDITION.   Hower,  Mardi Mainland.D on 12/22/2015 at 1:57 PM  Between 7am to 6pm - Pager - [phone]  After 6pm: House Pager: - [phone]  [facility]  Office  [phone]  CC: Primary care physician; Mickel Fuchs, MD

## 2015-12-23 LAB — CBC
HCT: 28.6 % — ABNORMAL LOW (ref 40.0–52.0)
Hemoglobin: 9 g/dL — ABNORMAL LOW (ref 13.0–18.0)
MCH: 22.1 pg — ABNORMAL LOW (ref 26.0–34.0)
MCHC: 31.5 g/dL — ABNORMAL LOW (ref 32.0–36.0)
MCV: 70.2 fL — ABNORMAL LOW (ref 80.0–100.0)
PLATELETS: 325 10*3/uL (ref 150–440)
RBC: 4.08 MIL/uL — ABNORMAL LOW (ref 4.40–5.90)
RDW: 14.9 % — AB (ref 11.5–14.5)
WBC: 11.9 10*3/uL — AB (ref 3.8–10.6)

## 2015-12-23 MED ORDER — AMOXICILLIN-POT CLAVULANATE 875-125 MG PO TABS: 1.0000 | ORAL_TABLET | Freq: Two times a day (BID) | ORAL | 0 refills | Status: AC

## 2015-12-23 MED ORDER — TRAMADOL HCL 50 MG PO TABS: 50.0000 mg | ORAL_TABLET | Freq: Four times a day (QID) | ORAL | 0 refills | Status: AC | PRN

## 2015-12-23 MED ORDER — ALPRAZOLAM 0.5 MG PO TABS: 0.5000 mg | ORAL_TABLET | Freq: Three times a day (TID) | ORAL | 0 refills | Status: AC | PRN

## 2015-12-23 NOTE — Care Management Important Message (Signed)
Important Message  Patient Details  Name: Walter Wong MRN: [id number] Date of Birth: 12/13/1985   Medicare Important Message Given:  Yes    Gwenette GreetBrenda S Emmalou Hunger, RN 12/23/2015, 8:52 AM

## 2015-12-23 NOTE — Clinical Social Work Note (Signed)
Clinical Social Work Assessment  Patient Details  Name: Walter Wong MRN: [id number] Date of Birth: Feb 11, 1986  Date of referral:  12/23/15               Reason for consult:  Discharge Planning                Permission sought to share information with:  Family Supports Permission granted to share information::  Yes, Verbal Permission Granted  Name::        Agency::     Relationship::   (Mother)  Contact Information:     Housing/Transportation Living arrangements for the past 2 months:  [place] of Information:  Patient Patient Interpreter Needed:  None Criminal Activity/Legal Involvement Pertinent to Current Situation/Hospitalization:  No - Comment as needed Significant Relationships:  Other Family Members, Siblings, Parents Lives with:  Facility Resident [facility]) Do you feel safe going back to the place where you live?  Yes Need for family participation in patient care:  No (Coment)  Care giving concerns:  Patient is from [facility] for STR   Social Worker assessment / plan:  CSW met with patient at bedside. Introduced herself and her role. Per patient he's been at [facility] for about 1 week for STR. Stated that he wants to return at discharge. Stated tht the EMS is the best way to transport him. Reported that he was in room 38. Granted CSW verbal permission to coordinate discharge with [facility].   Clinical Social Worker was informed that patient will be medically ready to discharge to [facility]. Patient in a agreement with plan. CSW called [place]- Admissions at [facility] to confirm that patient's bed is ready. Provided patient's room number 38 and number to call for report [phone]. All discharge information faxed to [facility] via [facility].    Call to patient's mother, could not leave voicemail. RN will call report and patient will discharge to [facility] via EMS  Employment status:  Disabled (Comment on whether or not currently receiving  Disability) Insurance information:  Medicare PT Recommendations:  Not assessed at this time Information / Referral to community resources:     Patient/Family's Response to care:  Patient in agreement to return to [facility]  Patient/Family's Understanding of and Emotional Response to Diagnosis, Current Treatment, and Prognosis:  Stated he understood. Thanked CSW for assistance.   Emotional Assessment Appearance:  Appears older than stated age Attitude/Demeanor/Rapport:   (None) Affect (typically observed):  Accepting, Calm, Pleasant Orientation:  Oriented to Self, Oriented to Place, Oriented to  Time, Oriented to Situation Alcohol / Substance use:  Not Applicable Psych involvement (Current and /or in the community):  No (Comment)  Discharge Needs  Concerns to be addressed:  Discharge Planning Concerns Readmission within the last 30 days:  No Current discharge risk:  None Barriers to Discharge:  No Barriers Identified   [place], LCSW 12/23/2015, 12:06 PM

## 2015-12-23 NOTE — Discharge Summary (Addendum)
[facility] - [place] at [facility]   PATIENT NAME: Walter Wong    MR#:  [id number]  DATE OF BIRTH:  1985/04/23  DATE OF ADMISSION:  12/21/2015 ADMITTING PHYSICIAN: Stephanie Acre, MD  DATE OF DISCHARGE: 12/23/15  PRIMARY CARE PHYSICIAN: Mickel Fuchs, MD    ADMISSION DIAGNOSIS:  Healthcare-associated pneumonia [J18.9] Interstitial fibrosis (HCC) [J84.10] Acute on chronic respiratory failure with hypoxia (HCC) [J96.21]  DISCHARGE DIAGNOSIS:  Active Problems:  Acute on chronic respiratory failure with hypoxia   Healthcare-associated pneumonia   Interstitial fibrosis (HCC)   SECONDARY DIAGNOSIS:   Past Medical History:  Diagnosis Date  . ADHD (attention deficit hyperactivity disorder)   . Bipolar disorder (HCC)   . CHF (congestive heart failure) (HCC)    Diastolic CHF  . Chronic kidney disease    Stage 3  . Diabetes mellitus, type II (HCC)   . Interstitial lung disease (HCC)    on nocturnal home o2  . Schizophrenia [facility])     HOSPITAL COURSE:  Walter Wong  is a 30 y.o. male admitted 12/21/2015 with chief complaint Hyperventilating . Please see H&P performed by Stephanie Acre, MD for further information. Patient was in the hospital with the above symptoms including shortness of breath requiring more oxygen than baseline. Had evidence suggestive of pneumonia protocol calcitonin mildly elevated, started on broad-spectrum antibiotics with improvement of symptoms. Antibiotics downgraded will continue full course of Augmentin on discharge. He has made significant improvement is now back at baseline respiratory status  DISCHARGE CONDITIONS:   Stable  CONSULTS OBTAINED:    DRUG ALLERGIES:  No Known Allergies  DISCHARGE MEDICATIONS:   Current Discharge Medication List    START taking these medications   Details  ALPRAZolam (XANAX) 0.5 MG tablet Take 1 tablet (0.5 mg total) by mouth 3 (three) times daily as needed for anxiety. Qty: 30 tablet,  Refills: 0    amoxicillin-clavulanate (AUGMENTIN) 875-125 MG tablet Take 1 tablet by mouth 2 (two) times daily. Qty: 8 tablet, Refills: 0      CONTINUE these medications which have CHANGED   Details  !! traMADol (ULTRAM) 50 MG tablet Take 1 tablet (50 mg total) by mouth every 6 (six) hours as needed for moderate pain. Qty: 30 tablet, Refills: 0     !! - Potential duplicate medications found. Please discuss with provider.    CONTINUE these medications which have NOT CHANGED   Details  acetaminophen (TYLENOL) 325 MG tablet Take 650 mg by mouth every 6 (six) hours as needed.    carbamazepine (TEGRETOL) 200 MG tablet Take 1 tablet (200 mg total) by mouth 2 (two) times daily. Qty: 60 tablet, Refills: 5    pantoprazole (PROTONIX) 40 MG tablet Take 1 tablet (40 mg total) by mouth 2 (two) times daily. Qty: 60 tablet, Refills: 2    !! QUEtiapine (SEROQUEL) 100 MG tablet Take 1 tablet (100 mg total) by mouth daily. Qty: 30 tablet, Refills: 5    !! QUEtiapine (SEROQUEL) 300 MG tablet Take 2 tablets (600 mg total) by mouth at bedtime. Qty: 60 tablet, Refills: 5    thiothixene (NAVANE) 10 MG capsule Take 1 capsule (10 mg total) by mouth 2 (two) times daily. Qty: 60 capsule, Refills: 60    tiotropium (SPIRIVA HANDIHALER) 18 MCG inhalation capsule Place 1 capsule (18 mcg total) into inhaler and inhale daily. Qty: 30 capsule, Refills: 2    torsemide (DEMADEX) 10 MG tablet Take 10 mg by mouth every morning.     !!  traMADol (ULTRAM) 50 MG tablet Take 50 mg by mouth every 6 (six) hours as needed for moderate pain.      !! - Potential duplicate medications found. Please discuss with provider.    STOP taking these medications     levofloxacin (LEVAQUIN) 500 MG tablet          DISCHARGE INSTRUCTIONS:    DIET:  Cardiac diet  DISCHARGE CONDITION:  Stable  ACTIVITY:  Activity as tolerated  OXYGEN:  Home Oxygen: Yes.     Oxygen Delivery: 3 liters/min via Patient connected to  nasal cannula oxygen  DISCHARGE LOCATION:  nursing home   If you experience worsening of your admission symptoms, develop shortness of breath, life threatening emergency, suicidal or homicidal thoughts you must seek medical attention immediately by calling 911 or calling your MD immediately  if symptoms less severe.  You Must read complete instructions/literature along with all the possible adverse reactions/side effects for all the Medicines you take and that have been prescribed to you. Take any new Medicines after you have completely understood and accpet all the possible adverse reactions/side effects.   Please note  You were cared for by a hospitalist during your hospital stay. If you have any questions about your discharge medications or the care you received while you were in the hospital after you are discharged, you can call the unit and asked to speak with the hospitalist on call if the hospitalist that took care of you is not available. Once you are discharged, your primary care physician will handle any further medical issues. Please note that NO REFILLS for any discharge medications will be authorized once you are discharged, as it is imperative that you return to your primary care physician (or establish a relationship with a primary care physician if you do not have one) for your aftercare needs so that they can reassess your need for medications and monitor your lab values.    On the day of Discharge:   VITAL SIGNS:  Blood pressure 105/60, pulse 90, temperature 97.9 F (36.6 C), resp. rate 18, height 5\' 6"  (1.676 m), weight 91.6 kg (202 lb), SpO2 98 %.  I/O:   Intake/Output Summary (Last 24 hours) at 12/23/15 0906 Last data filed at 12/23/15 0709  Gross per 24 hour  Intake              200 ml  Output             3100 ml  Net            -2900 ml    PHYSICAL EXAMINATION:  GENERAL:  30 y.o.-year-old patient lying in the bed with no acute distress.  EYES: Pupils equal,  round, reactive to light and accommodation. No scleral icterus. Extraocular muscles intact.  HEENT: Head atraumatic, normocephalic. Oropharynx and nasopharynx clear.  NECK:  Supple, no jugular venous distention. No thyroid enlargement, no tenderness.  LUNGS: Normal breath sounds bilaterally, no wheezing, rales,rhonchi or crepitation. No use of accessory muscles of respiration.  CARDIOVASCULAR: S1, S2 normal. No murmurs, rubs, or gallops.  ABDOMEN: Soft, non-tender, non-distended. Bowel sounds present. No organomegaly or mass.  EXTREMITIES: No pedal edema, cyanosis, or clubbing.  NEUROLOGIC: Cranial nerves II through XII are intact. Muscle strength 5/5 in all extremities. Sensation intact. Gait not checked.  PSYCHIATRIC: The patient is alert and oriented x 3.  SKIN: No obvious rash, lesion, or ulcer.   DATA REVIEW:   CBC  Recent Labs Lab 12/23/15 0829  WBC  11.9*  HGB 9.0*  HCT 28.6*  PLT 325    Chemistries   Recent Labs Lab 12/21/15 1638 12/22/15 0434  NA 137 138  K 3.9 3.9  CL 95* 100*  CO2 30 30  GLUCOSE 130* 102*  BUN 40* 43*  CREATININE 2.52* 2.36*  CALCIUM 9.4 8.8*  MG  --  1.5*  AST 23  --   ALT 20  --   ALKPHOS 58  --   BILITOT <0.1*  --     Cardiac Enzymes  Recent Labs Lab 12/21/15 1638  TROPONINI 0.03*    Microbiology Results  Results for orders placed or performed during the hospital encounter of 12/21/15  Blood culture (routine x 2)     Status: None (Preliminary result)   Collection Time: 12/21/15  6:03 PM  Result Value Ref Range Status   Specimen Description BLOOD LEFT ANTECUBITAL  Final   Special Requests   Final    BOTTLES DRAWN AEROBIC AND ANAEROBIC AER 5CC ANA 3CC   Culture NO GROWTH 2 DAYS  Final   Report Status PENDING  Incomplete  Blood culture (routine x 2)     Status: None (Preliminary result)   Collection Time: 12/21/15  6:03 PM  Result Value Ref Range Status   Specimen Description BLOOD RIGHT HAND  Final   Special Requests  BOTTLES DRAWN AEROBIC AND ANAEROBIC 2CC  Final   Culture NO GROWTH 2 DAYS  Final   Report Status PENDING  Incomplete    RADIOLOGY:  Dg Chest Portable 1 View  Result Date: 12/21/2015 CLINICAL DATA:  Chronic hypoxic respiratory failure secondary to interstitial lung disease. EXAM: PORTABLE CHEST 1 VIEW COMPARISON:  12/18/2015 FINDINGS: Heart size is normal. Mediastinal shadows are normal. Diffuse pulmonary fibrosis again demonstrated. One could question slight increase in prominence of the markings in the lower lungs, particularly on the right, raising the possibility of mild pneumonia. No dense consolidation or lobar collapse. IMPRESSION: Background pattern of pulmonary fibrosis. Question slight accentuation of markings at the lung bases raising the possibility of mild pneumonia. Electronically Signed   By: Paulina Fusi M.D.   On: 12/21/2015 16:57     Management plans discussed with the patient, family and they are in agreement.  CODE STATUS:     Code Status Orders        Start     Ordered   12/21/15 1926  Full code  Continuous     12/21/15 1926    Code Status History    Date Active Date Inactive Code Status Order ID Comments User Context   12/17/2015  2:18 PM 12/20/2015  4:12 PM Full Code [id number]  Milagros Loll, MD ED   10/22/2015  6:53 PM 10/26/2015  2:52 PM Full Code [id number]  Houston Siren, MD Inpatient   10/09/2015 11:28 AM 10/13/2015  3:22 PM Full Code [id number]  Enid Baas, MD Inpatient   08/17/2015  2:35 PM 08/20/2015  5:27 PM Full Code [id number]  Enedina Finner, MD ED   01/22/2015  5:27 PM 01/24/2015  2:25 PM Full Code [id number]  Houston Siren, MD Inpatient      TOTAL TIME TAKING CARE OF THIS PATIENT: 33 minutes.    Hower,  Mardi Mainland.D on 12/23/2015 at 9:06 AM  Between 7am to 6pm - Pager - [phone]  After 6pm go to [email or web address] - Scientist, research (life sciences) [place] [facility]  Office  [phone]  CC: Primary care physician; Mickel Fuchs,  MD

## 2015-12-23 NOTE — Progress Notes (Signed)
Came into room patient said he was having a hard time breathing, o2 saturation in the mid 70's heart rate 127, increased o2, called respiratory had patient cough and slow respirations, patient cough up some phlegm and a small amount of undigested food, o2 went back on to 94% when placed back on  3 liters and HR dropped to 110, RN called MD.   Respiratory therapist in room

## 2015-12-23 NOTE — Progress Notes (Signed)
[phone]). Patient going to room 38A.  Patient back on chronic 3L oxygen. Updated her of new medications added for augmentin and xanax. Explained patient can get anxious at times requiring the xanax and emotional support. Patient is aware and agreeable of discharge plan back to [facility]. EMS called for transportation to facility.

## 2015-12-26 LAB — CULTURE, BLOOD (ROUTINE X 2)
CULTURE: NO GROWTH
CULTURE: NO GROWTH

## 2016-01-12 DEATH — deceased

## 2017-01-01 IMAGING — CT CT CHEST HIGH RESOLUTION W/O CM
3 of 5 series · 14 of 36 positions shown, 15 images · non-contrast
Comparison: Chest CT 08/18/2015.

CLINICAL DATA: 30-year-old male with history of chronic kidney, and
clinical concern for potential interstitial lung disease.

EXAM:
CT CHEST WITHOUT CONTRAST
TECHNIQUE: Multidetector CT imaging of the chest was performed following the
standard protocol without intravenous contrast. High resolution
imaging of the lungs, as well as inspiratory and expiratory imaging,
was performed.

[Series 2: inspiratory · axial · 0.62mm/px · z∈[-492,-341]mm · 3 of 12 slices shown, 4 images]
[im 1/12  mediastinal]
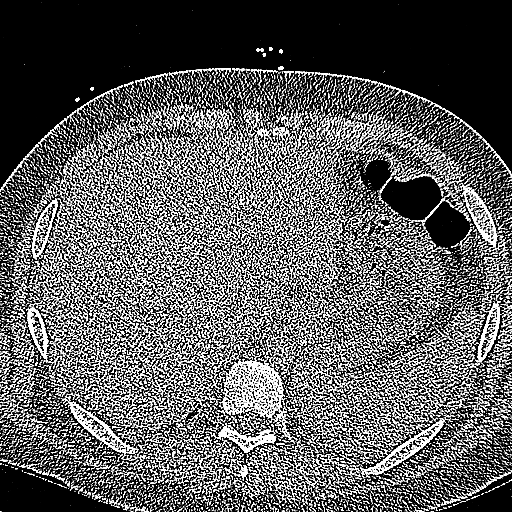
[im 1/12  lung]
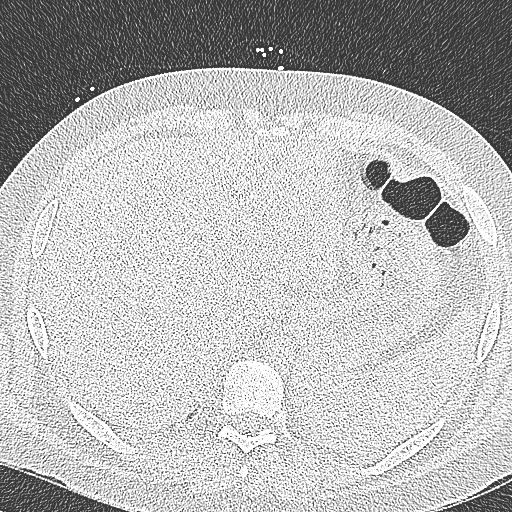
[im 6/12  lung]
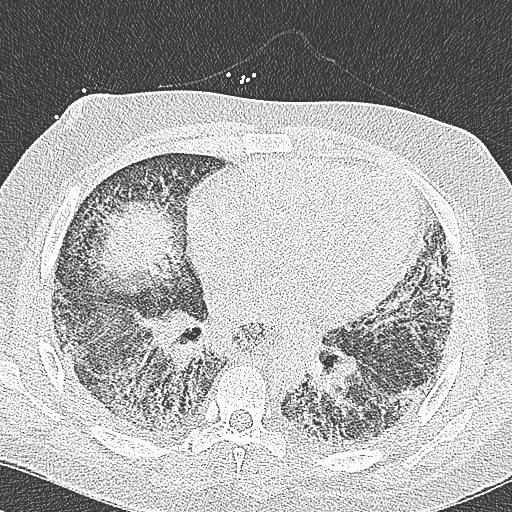
[im 12/12  lung]
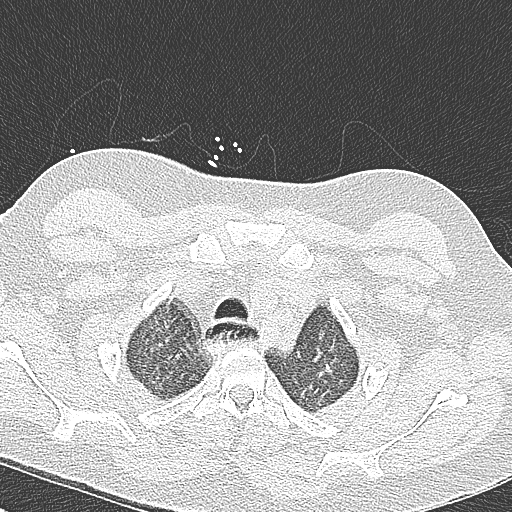

[Series 4: standard · axial · 0.64mm/px · z∈[-514,-324]mm · 8 of 117 slices shown]
[im 11/117  mediastinal]
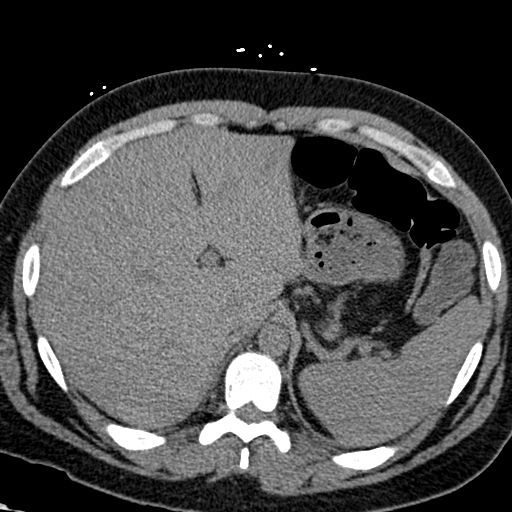
[im 22/117  mediastinal]
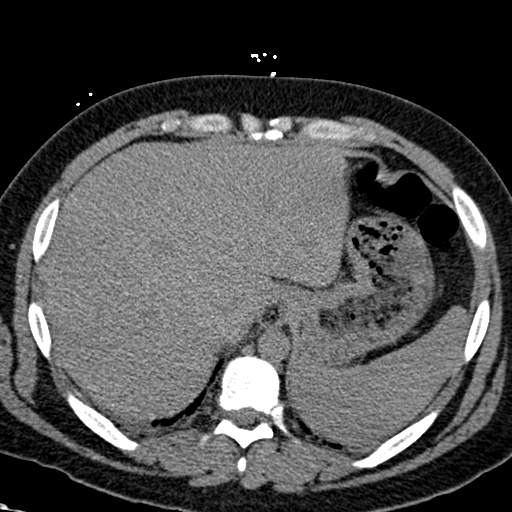
[im 37/117  mediastinal]
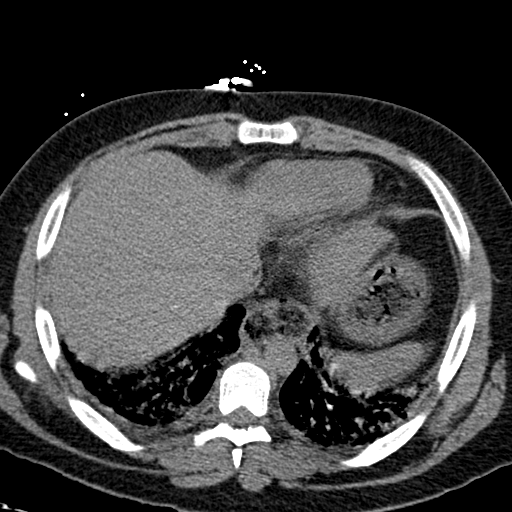
[im 53/117  mediastinal]
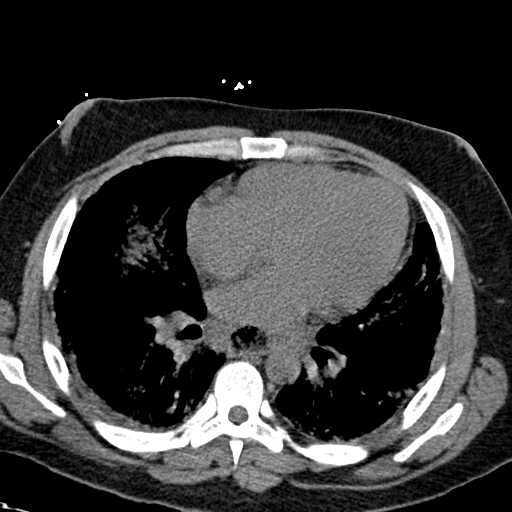
[im 64/117  mediastinal]
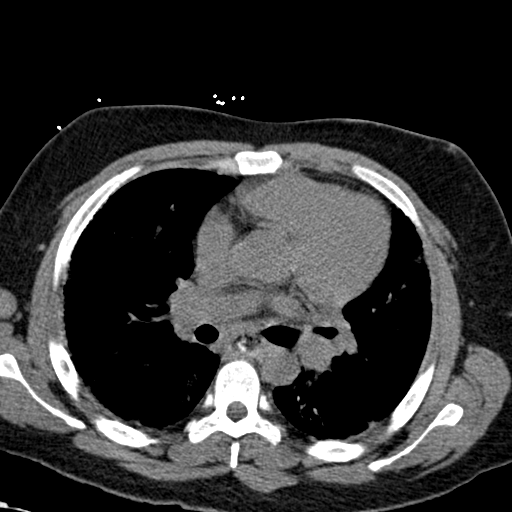
[im 80/117  mediastinal]
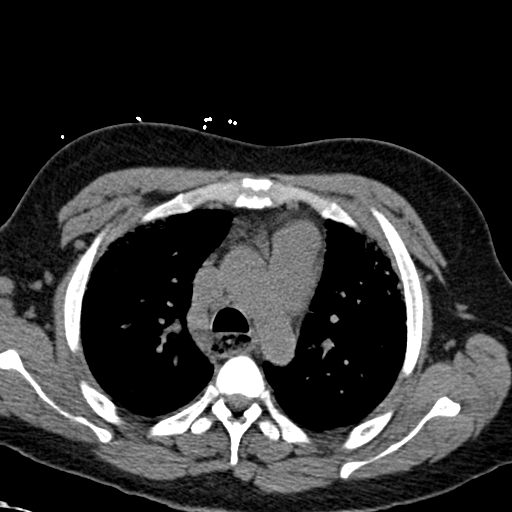
[im 95/117  mediastinal]
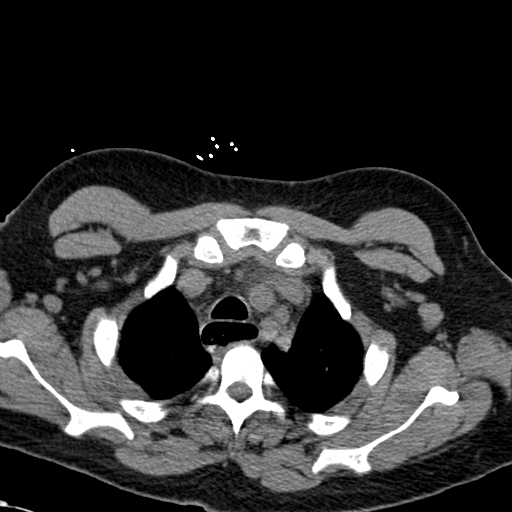
[im 106/117  mediastinal]
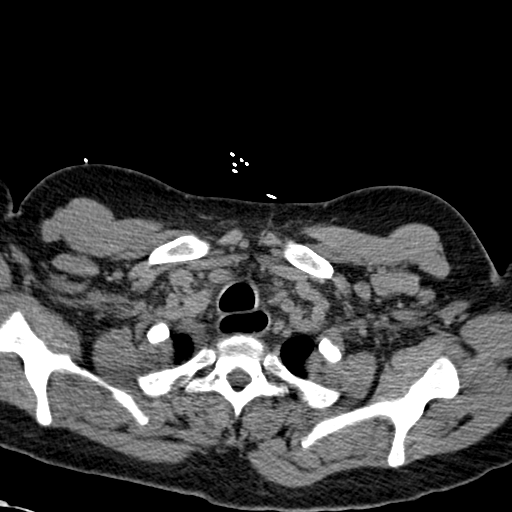

[Series 8: coronal · coronal · 0.59mm/px · 3 of 121 slices shown]
[im 25/121  lung]
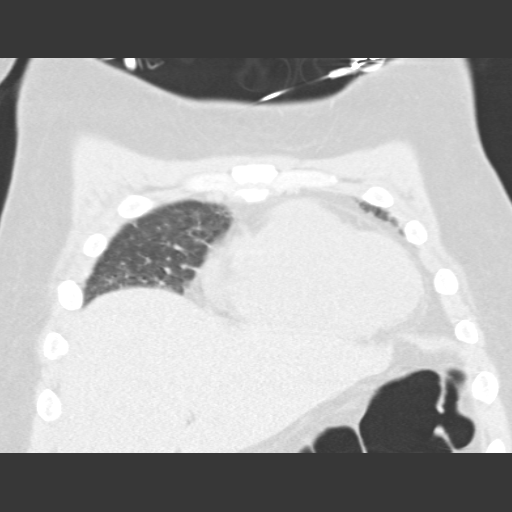
[im 49/121  lung]
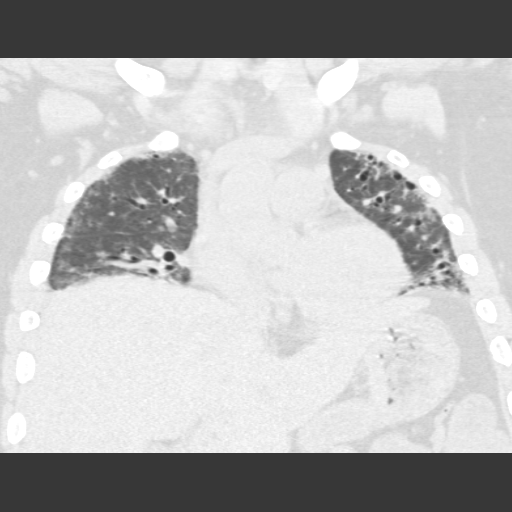
[im 73/121  lung]
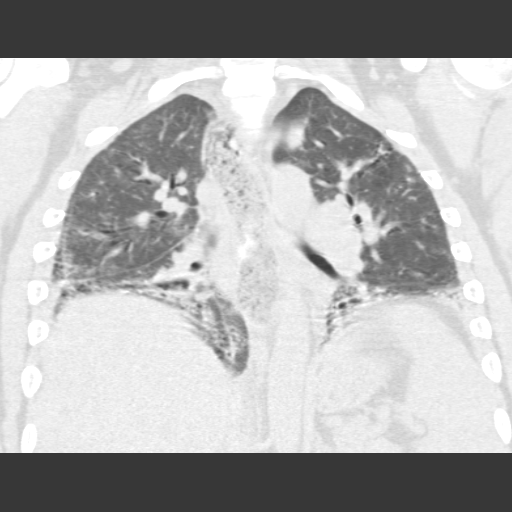

[14 of 36 positions shown; findings below may reference images not displayed]

FINDINGS: Cardiovascular: Heart size is normal. There is no significant
pericardial fluid, thickening or pericardial calcification. No
significant atherosclerotic calcifications are identified in the
thoracic aorta or the coronary arteries.

Mediastinum/Nodes: Multiple borderline enlarged mediastinal lymph
nodes are noted, which are nonspecific. Please note that accurate
exclusion of hilar adenopathy is limited on noncontrast CT scans.
Esophagus is dilated and filled with a large amount of retained food
and debris.

Lungs/Pleura: There are extensive areas of subpleural and
peribronchovascular reticulation, severe thickening of the
peribronchovascular interstitium, parenchymal banding and traction
bronchiectasis throughout the lungs bilaterally. The distribution of
these findings has a definitive craniocaudal gradient. No frank
honeycombing is confidently identified at this time. Inspiratory and
expiratory imaging images remarkable for some mild air trapping. No
acute consolidative airspace disease. Trace right pleural effusion
lying dependently.

Upper Abdomen: Unremarkable.

Musculoskeletal: There are no aggressive appearing lytic or blastic
lesions noted in the visualized portions of the skeleton.
IMPRESSION: 1. The appearance of the lungs is compatible with interstitial lung
disease. Given the strong craniocaudal gradient and advanced nature
of the findings, this is concerning for potential usual interstitial
pneumonia (UIP), however, no frank honeycombing is identified at
this time. The possibility of severe fibrotic phase nonspecific
interstitial pneumonia (NSIP) is also considered. Repeat
high-resolution chest CT is recommended in 6-12 months to assess for
further temporal changes in the appearance of the lung parenchyma.
2. Dilated esophagus filled with retained food and debris. The
possibility of achalasia should be considered, and further clinical
correlation is recommended. Evaluation for signs and symptoms of
scleroderma is suggested.
3. Trace right pleural effusion lying dependently.

## 2017-03-13 IMAGING — DX DG CHEST 1V PORT
1 series · 1 of 1 positions shown · non-contrast
Comparison: 12/18/2015

CLINICAL DATA: Chronic hypoxic respiratory failure secondary to
interstitial lung disease.

EXAM:
PORTABLE CHEST 1 VIEW

[chest ap]
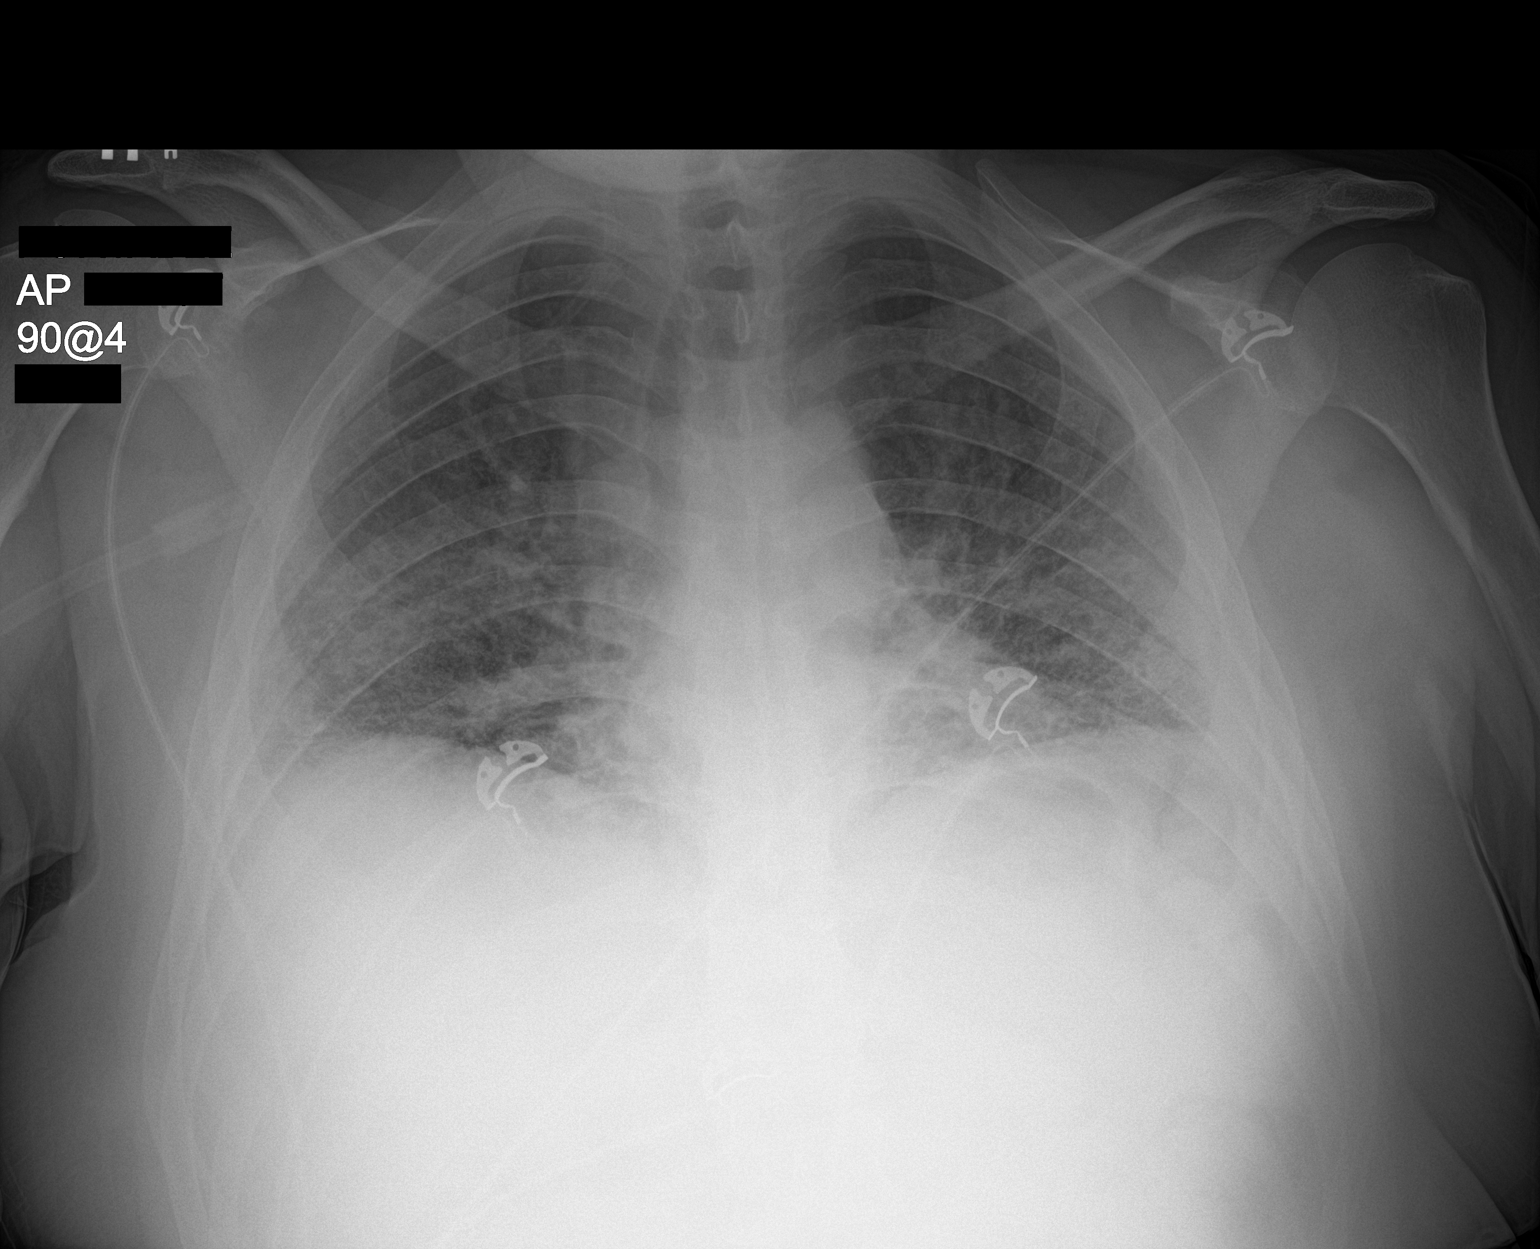

[1 of 1 positions shown; findings below may reference images not displayed]

FINDINGS: Heart size is normal. Mediastinal shadows are normal. Diffuse
pulmonary fibrosis again demonstrated. One could question slight
increase in prominence of the markings in the lower lungs,
particularly on the right, raising the possibility of mild
pneumonia. No dense consolidation or lobar collapse.
IMPRESSION: Background pattern of pulmonary fibrosis. Question slight
accentuation of markings at the lung bases raising the possibility
of mild pneumonia.
